# Patient Record
Sex: Male | Born: 1955 | Race: Black or African American | Hispanic: No | Smoking: Never smoker
Health system: Southern US, Community
[De-identification: ages and names within clinical notes are randomized; demographics above are authoritative.]

## PROBLEM LIST (undated history)

## (undated) DIAGNOSIS — G629 Polyneuropathy, unspecified: Secondary | ICD-10-CM

## (undated) DIAGNOSIS — I10 Essential (primary) hypertension: Secondary | ICD-10-CM

## (undated) DIAGNOSIS — R52 Pain, unspecified: Secondary | ICD-10-CM

## (undated) DIAGNOSIS — E7800 Pure hypercholesterolemia, unspecified: Secondary | ICD-10-CM

## (undated) DIAGNOSIS — H9190 Unspecified hearing loss, unspecified ear: Secondary | ICD-10-CM

## (undated) DIAGNOSIS — R202 Paresthesia of skin: Secondary | ICD-10-CM

## (undated) HISTORY — PX: BACK SURGERY: SHX140

## (undated) HISTORY — DX: Pain, unspecified: R52

## (undated) HISTORY — DX: Polyneuropathy, unspecified: G62.9

## (undated) HISTORY — DX: Pure hypercholesterolemia, unspecified: E78.00

## (undated) HISTORY — DX: Paresthesia of skin: R20.2

## (undated) HISTORY — DX: Unspecified hearing loss, unspecified ear: H91.90

## (undated) HISTORY — DX: Essential (primary) hypertension: I10

---

## 1992-05-24 HISTORY — PX: VASECTOMY: SHX75

## 2007-07-25 HISTORY — PX: COLONOSCOPY, DIAGNOSTIC (SCREENING): SHX174

## 2015-02-09 HISTORY — PX: BUNIONECTOMY: SHX129

## 2018-05-31 ENCOUNTER — Encounter (HOSPITAL_COMMUNITY): Payer: Self-pay | Admitting: Emergency Medicine

## 2018-05-31 ENCOUNTER — Emergency Department (HOSPITAL_COMMUNITY): Payer: BLUE CROSS/BLUE SHIELD

## 2018-05-31 ENCOUNTER — Emergency Department (HOSPITAL_COMMUNITY)
Admission: EM | Admit: 2018-05-31 | Discharge: 2018-05-31 | Disposition: A | Discharge status: Home / Self Care | Payer: BLUE CROSS/BLUE SHIELD | Attending: Emergency Medicine | Admitting: Emergency Medicine

## 2018-05-31 ENCOUNTER — Other Ambulatory Visit: Payer: Self-pay

## 2018-05-31 DIAGNOSIS — Y92410 Unspecified street and highway as the place of occurrence of the external cause: Secondary | ICD-10-CM | POA: Insufficient documentation

## 2018-05-31 DIAGNOSIS — Z7901 Long term (current) use of anticoagulants: Secondary | ICD-10-CM | POA: Diagnosis not present

## 2018-05-31 DIAGNOSIS — Y999 Unspecified external cause status: Secondary | ICD-10-CM | POA: Diagnosis not present

## 2018-05-31 DIAGNOSIS — M62838 Other muscle spasm: Secondary | ICD-10-CM | POA: Insufficient documentation

## 2018-05-31 DIAGNOSIS — Y9389 Activity, other specified: Secondary | ICD-10-CM | POA: Insufficient documentation

## 2018-05-31 DIAGNOSIS — R51 Headache: Secondary | ICD-10-CM | POA: Insufficient documentation

## 2018-05-31 DIAGNOSIS — Z79899 Other long term (current) drug therapy: Secondary | ICD-10-CM | POA: Diagnosis not present

## 2018-05-31 DIAGNOSIS — R519 Headache, unspecified: Secondary | ICD-10-CM

## 2018-05-31 MED ORDER — ACETAMINOPHEN 500 MG PO TABS
1000.0000 mg | ORAL_TABLET | Freq: Once | ORAL | Status: AC
  Administered 2018-05-31: 1000 mg via ORAL
  Filled 2018-05-31: qty 2

## 2018-05-31 MED ORDER — DIAZEPAM 5 MG PO TABS
5.0000 mg | ORAL_TABLET | Freq: Once | ORAL | Status: AC
Start: 2018-05-31 — End: 2018-05-31
  Administered 2018-05-31: 5 mg via ORAL
  Filled 2018-05-31: qty 1

## 2018-05-31 MED ORDER — SODIUM CHLORIDE 0.9 % IV BOLUS
1000.0000 mL | Freq: Once | INTRAVENOUS | Status: AC
  Administered 2018-05-31: 1000 mL via INTRAVENOUS

## 2018-05-31 MED ORDER — METHOCARBAMOL 500 MG PO TABS: 500.0000 mg | ORAL_TABLET | Freq: Three times a day (TID) | ORAL | 0 refills | Status: AC | PRN
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
## 2018-05-31 NOTE — Discharge Instructions (Addendum)
Please take Tylenol (acetaminophen) to relieve your pain.  You may take tylenol, up to 1,000 mg (two extra strength pills).  Do not take more than 3,000 mg tylenol in a 24 hour period.  Please check all medication labels as many medications such as pain and cold medications may contain tylenol. Please do not drink alcohol while taking this medication.   The best way to get rid of muscle pain is by taking NSAIDS, using heat, massage therapy, and gentle stretching/range of motion exercises.   Today you received medications that may make you sleepy or impair your ability to make decisions.  For the next 24 hours please do not drive, operate heavy machinery, care for a small child with out another adult present, or perform any activities that may cause harm to you or someone else if you were to fall asleep or be impaired.   You are being prescribed a medication which may make you sleepy. Please follow up of listed precautions for at least 24 hours after taking one dose.

## 2018-05-31 NOTE — ED Notes (Signed)
Pt discharge with no signs symptoms of distress. Pt ambulatory and verbalizes understanding of dc instructions

## 2018-05-31 NOTE — ED Triage Notes (Signed)
Pt is on xarelto for DVT.
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
## 2018-06-01 ENCOUNTER — Encounter (HOSPITAL_COMMUNITY): Payer: Self-pay | Admitting: Emergency Medicine

## 2018-06-01 ENCOUNTER — Emergency Department (HOSPITAL_COMMUNITY)
Admission: EM | Admit: 2018-06-01 | Discharge: 2018-06-02 | Disposition: A | Discharge status: Home / Self Care | Payer: BLUE CROSS/BLUE SHIELD | Attending: Emergency Medicine | Admitting: Emergency Medicine

## 2018-06-01 DIAGNOSIS — Z79899 Other long term (current) drug therapy: Secondary | ICD-10-CM | POA: Diagnosis not present

## 2018-06-01 DIAGNOSIS — R51 Headache: Secondary | ICD-10-CM | POA: Diagnosis not present

## 2018-06-01 DIAGNOSIS — F0781 Postconcussional syndrome: Secondary | ICD-10-CM | POA: Insufficient documentation

## 2018-06-01 MED ORDER — ONDANSETRON 4 MG PO TBDP
4.0000 mg | ORAL_TABLET | Freq: Once | ORAL | Status: AC
  Administered 2018-06-01: 4 mg via ORAL
  Filled 2018-06-01: qty 1
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
## 2018-06-02 ENCOUNTER — Emergency Department (HOSPITAL_COMMUNITY): Payer: BLUE CROSS/BLUE SHIELD

## 2018-06-02 LAB — CBC WITH DIFFERENTIAL/PLATELET
Abs Immature Granulocytes: 0 10*3/uL (ref 0.0–0.1)
Basophils Absolute: 0 10*3/uL (ref 0.0–0.1)
Basophils Relative: 0 %
EOS ABS: 0 10*3/uL (ref 0.0–0.7)
EOS PCT: 0 %
HEMATOCRIT: 51.6 % (ref 39.0–52.0)
HEMOGLOBIN: 17.4 g/dL — AB (ref 13.0–17.0)
Immature Granulocytes: 0 %
LYMPHS ABS: 1.2 10*3/uL (ref 0.7–4.0)
Lymphocytes Relative: 24 %
MCH: 29.9 pg (ref 26.0–34.0)
MCHC: 33.7 g/dL (ref 30.0–36.0)
MCV: 88.7 fL (ref 78.0–100.0)
MONO ABS: 0.6 10*3/uL (ref 0.1–1.0)
Monocytes Relative: 10 %
Neutro Abs: 3.5 10*3/uL (ref 1.7–7.7)
Neutrophils Relative %: 66 %
Platelets: 146 10*3/uL — ABNORMAL LOW (ref 150–400)
RBC: 5.82 MIL/uL — AB (ref 4.22–5.81)
RDW: 12.3 % (ref 11.5–15.5)
WBC: 5.3 10*3/uL (ref 4.0–10.5)

## 2018-06-02 LAB — PROTIME-INR
INR: 1.13
Prothrombin Time: 14.4 seconds (ref 11.4–15.2)

## 2018-06-02 LAB — BASIC METABOLIC PANEL
Anion gap: 9 (ref 5–15)
BUN: 12 mg/dL (ref 6–20)
CHLORIDE: 106 mmol/L (ref 101–111)
CO2: 24 mmol/L (ref 22–32)
CREATININE: 1.25 mg/dL — AB (ref 0.61–1.24)
Calcium: 9.6 mg/dL (ref 8.9–10.3)
GFR calc Af Amer: >60 mL/min (ref >60)
GLUCOSE: 105 mg/dL — AB (ref 65–99)
Potassium: 4 mmol/L (ref 3.5–5.1)
SODIUM: 139 mmol/L (ref 135–145)

## 2018-06-02 MED ORDER — METOCLOPRAMIDE HCL 5 MG/ML IJ SOLN
10.0000 mg | Freq: Once | INTRAMUSCULAR | Status: AC
  Administered 2018-06-02: 10 mg via INTRAVENOUS
  Filled 2018-06-02: qty 2

## 2018-06-02 MED ORDER — KETOROLAC TROMETHAMINE 30 MG/ML IJ SOLN
15.0000 mg | Freq: Once | INTRAMUSCULAR | Status: AC
  Administered 2018-06-02: 15 mg via INTRAVENOUS
  Filled 2018-06-02: qty 1

## 2018-06-02 MED ORDER — SODIUM CHLORIDE 0.9 % IV BOLUS
1000.0000 mL | Freq: Once | INTRAVENOUS | Status: AC
  Administered 2018-06-02: 1000 mL via INTRAVENOUS

## 2018-06-02 MED ORDER — DIPHENHYDRAMINE HCL 50 MG/ML IJ SOLN
25.0000 mg | Freq: Once | INTRAMUSCULAR | Status: AC
  Administered 2018-06-02: 25 mg via INTRAVENOUS
  Filled 2018-06-02: qty 1

## 2018-06-02 NOTE — ED Notes (Signed)
Pt given a ginger ale for PO challenge  
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
## 2018-06-02 NOTE — ED Notes (Signed)
Called Patient to reassess vitals x3 and had no response. 

## 2018-08-12 ENCOUNTER — Encounter: Payer: Self-pay | Admitting: Internal Medicine

## 2018-08-12 ENCOUNTER — Ambulatory Visit: Payer: No Typology Code available for payment source | Admitting: Internal Medicine

## 2018-08-12 DIAGNOSIS — F524 Premature ejaculation: Secondary | ICD-10-CM

## 2018-08-12 DIAGNOSIS — I444 Left anterior fascicular block: Secondary | ICD-10-CM

## 2018-08-12 DIAGNOSIS — Z9889 Other specified postprocedural states: Secondary | ICD-10-CM

## 2018-08-12 DIAGNOSIS — I10 Essential (primary) hypertension: Secondary | ICD-10-CM

## 2018-08-12 DIAGNOSIS — Z0000 Encounter for general adult medical examination without abnormal findings: Secondary | ICD-10-CM

## 2018-08-12 DIAGNOSIS — A6001 Herpesviral infection of penis: Secondary | ICD-10-CM

## 2018-08-12 DIAGNOSIS — N200 Calculus of kidney: Secondary | ICD-10-CM

## 2018-08-12 DIAGNOSIS — E782 Mixed hyperlipidemia: Secondary | ICD-10-CM

## 2018-08-12 DIAGNOSIS — Z9852 Vasectomy status: Secondary | ICD-10-CM

## 2018-08-12 DIAGNOSIS — I82411 Acute embolism and thrombosis of right femoral vein: Secondary | ICD-10-CM

## 2018-08-12 DIAGNOSIS — K635 Polyp of colon: Secondary | ICD-10-CM

## 2018-08-12 HISTORY — DX: Calculus of kidney: N20.0

## 2018-08-12 HISTORY — DX: Left anterior fascicular block: I44.4

## 2018-08-12 HISTORY — DX: Herpesviral infection of penis: A60.01

## 2018-08-12 HISTORY — DX: Other specified postprocedural states: Z98.890

## 2018-08-12 HISTORY — DX: Vasectomy status: Z98.52

## 2018-08-12 HISTORY — DX: Premature ejaculation: F52.4

## 2018-08-12 HISTORY — DX: Mixed hyperlipidemia: E78.2

## 2018-08-12 HISTORY — DX: Polyp of colon: K63.5

## 2018-08-12 HISTORY — DX: Acute embolism and thrombosis of right femoral vein: I82.411

## 2018-08-12 MED ORDER — VALSARTAN-HYDROCHLOROTHIAZIDE 320-12.5 MG PO TABS
1.00 | ORAL_TABLET | Freq: Every day | ORAL | 1 refills | Status: DC
Start: 2018-08-12 — End: 2019-02-23

## 2018-08-12 MED ORDER — VALACYCLOVIR HCL 1 G PO TABS
1000.00 mg | ORAL_TABLET | Freq: Every day | ORAL | 3 refills | Status: DC
Start: 2018-08-12 — End: 2019-02-13

## 2018-08-12 MED ORDER — RIVAROXABAN 20 MG PO TABS
20.00 mg | ORAL_TABLET | Freq: Every day | ORAL | 1 refills | Status: DC
Start: 2018-08-12 — End: 2019-02-23

## 2018-08-12 NOTE — Progress Notes (Signed)
Date Time: 08/12/2018 10:06 AM  Patient Name: [name], [name]   DOB: [birth year]    Subjective:   [name] is a 62 y.o. male who presents for the very first time to meet his new primary care physician  He moved to this area recently as he has secured a new job  He takes care of himself very well and he keeps good notes about his medical history  Currently he has no breathing troubles chest pains bowel or urinary problems  Some of his medications he has not been taken and some he ran out off    Past Medical History:     Past Medical History:   Diagnosis Date   . 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 H/O lumbar discectomy 08/12/2018    2011    . H/O: vasectomy 08/12/2018   . 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   . Hyperplastic colonic polyp 08/12/2018    2017 return in 5 years   . Hypertension    . 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   . Mixed hyperlipidemia 08/12/2018   . Nephrolithiasis 08/12/2018    x2 last 2018    . Premature ejaculation 08/12/2018       Past Surgical History:     Past Surgical History:   Procedure Laterality Date   . BUNIONECTOMY  02/09/2015   . COLONOSCOPY  07/2007   . VASECTOMY  05/1992       Family History:   No family history on file.    Social History:     Social History     Social History   . Marital status: Divorced     Spouse name: N/A   . Number of children: N/A   . Years of education: N/A     Social History Main Topics   . Smoking status: Never Smoker   . Smokeless tobacco: Never Used   . Alcohol use Yes   . Drug use: No   . Sexual activity: Not Asked     Other Topics Concern   . None     Social History Narrative   . None       Allergies:   No Known Allergies    Medications:     Current Outpatient Prescriptions   Medication Sig Dispense Refill   . Multiple Vitamins-Minerals (MULTIVITAMIN WITH MINERALS) tablet Take 1 tablet by mouth daily     . rivaroxaban (XARELTO) 20 MG Tab Take 1 tablet (20 mg total) by mouth daily  with dinner 90 tablet 1   . sertraline (ZOLOFT) 50 MG tablet Take 50 mg by mouth daily     . simvastatin (ZOCOR) 40 MG tablet Take 40 mg by mouth nightly     . valacyclovir (VALTREX) 1000 MG tablet Take 1 tablet (1,000 mg total) by mouth daily 5 tablet 3   . vitamin D (CHOLECALCIFEROL) 1000 units tablet Take 5,000 Units by mouth daily     . valsartan-hydroCHLOROthiazide (DIOVAN HCT) 320-12.5 MG per tablet Take 1 tablet by mouth daily 90 tablet 1     No current facility-administered medications for this visit.        Review of Systems:   A comprehensive review of systems was:   General ROS:   Respiratory ROS: no cough, shortness of breath, or wheezing  Cardiovascular ROS: no chest pain or dyspnea on exertion  Gastrointestinal ROS: no abdominal pain, change in bowel habits, or black or  bloody stools  Genito-Urinary ROS: no dysuria, trouble voiding, or hematuria  Musculoskeletal ROS: negative for - joint pain or joint swelling  Neurological ROS: no TIA or stroke symptoms  Dermatologic ROS: negative for rash    Physical Exam:     Vitals:    08/12/18 0913   BP: (!) 167/96   Pulse: (!) 55   SpO2: 98%     BP Readings from Last 3 Encounters:   08/12/18 (!) 167/96     Wt Readings from Last 3 Encounters:   08/12/18 83 kg (183 lb)   Body mass index is 26.26 kg/m.    General appearance - alert, well appearing, and in no distress  Chest - clear to auscultation, no wheezes, rales or rhonchi, symmetric air entry  Heart - normal rate, regular rhythm, normal S1, S2, no murmurs, rubs, clicks or gallops  Abdomen - soft, nontender, nondistended, no masses or organomegaly  Neurological - alert, oriented, normal speech, no focal findings or movement disorder noted  Extremities - peripheral pulses normal, no pedal edema, no clubbing or cyanosis    Labs:   No results found for this or any previous visit (from the past 2016 hour(s)).    Assessment and Plan:     Patient Active Problem List   Diagnosis   . H/O: vasectomy   . Hyperplastic  colonic polyp   . 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 Hypertension   . H/O lumbar discectomy   . Nephrolithiasis   . Mixed hyperlipidemia   . Premature ejaculation   . Herpes simplex infection of penis   . LAFB (left anterior fascicular block)       No orders of the defined types were placed in this encounter.      1. 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 Hyperlipidemia but he is not taking his medications I told him to take the medications and come back in 3 months for a recheck on his labs    3.  Recurrent genital herpes about once a year and he gets Valtrex for 5 days new prescription was given    4.  Hypertension not well-controlled increase his Diovan to 320/12.5 and recheck at home and bring readings next visit    5.  History of premature ejaculation and he uses Zoloft for that    6.  History of colonic polyps last colonoscopy was in 2017 and he will follow-up in 5 years from that time    7.  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  History of kidney stones twice that he can remember drinking plenty of fluids  RTC in 3 months      This note was generated by the Epic EMR system/Speech recognition and may contain inherent errors or omissions not intended by the user. Grammatical errors, random word insertions, deletions and pronoun errors are occasional consequences of this technology due to software limitations.   Not all errors are caught or corrected. If there are questions or concerns about the content of this note or information contained within the body of this dictation they should be addressed directly with the author for clarification.      Signed by: Nyra Jabs

## 2018-11-07 ENCOUNTER — Ambulatory Visit: Payer: No Typology Code available for payment source | Admitting: Internal Medicine

## 2018-11-07 ENCOUNTER — Encounter: Payer: Self-pay | Admitting: Internal Medicine

## 2018-11-07 VITALS — BP 136/84 | HR 59 | Ht 70.0 in | Wt 194.0 lb

## 2018-11-07 DIAGNOSIS — Z9852 Vasectomy status: Secondary | ICD-10-CM

## 2018-11-07 DIAGNOSIS — Z9889 Other specified postprocedural states: Secondary | ICD-10-CM

## 2018-11-07 DIAGNOSIS — I10 Essential (primary) hypertension: Secondary | ICD-10-CM

## 2018-11-07 DIAGNOSIS — I82411 Acute embolism and thrombosis of right femoral vein: Secondary | ICD-10-CM

## 2018-11-07 DIAGNOSIS — F524 Premature ejaculation: Secondary | ICD-10-CM

## 2018-11-07 DIAGNOSIS — N200 Calculus of kidney: Secondary | ICD-10-CM

## 2018-11-07 DIAGNOSIS — I444 Left anterior fascicular block: Secondary | ICD-10-CM

## 2018-11-07 DIAGNOSIS — E782 Mixed hyperlipidemia: Secondary | ICD-10-CM

## 2018-11-07 DIAGNOSIS — K635 Polyp of colon: Secondary | ICD-10-CM

## 2018-11-07 DIAGNOSIS — A6001 Herpesviral infection of penis: Secondary | ICD-10-CM

## 2018-11-07 MED ORDER — SILDENAFIL CITRATE 100 MG PO TABS
100.0000 mg | ORAL_TABLET | ORAL | 1 refills | Status: DC | PRN
Start: 2018-11-07 — End: 2019-01-01
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
## 2018-11-08 LAB — BASIC METABOLIC PANEL
BUN / Creatinine Ratio: 11 (ref 10–24)
BUN: 16 mg/dL (ref 8–27)
CO2: 27 mmol/L (ref 20–29)
Calcium: 10 mg/dL (ref 8.6–10.2)
Chloride: 98 mmol/L (ref 96–106)
Creatinine: 1.4 mg/dL — ABNORMAL HIGH (ref 0.76–1.27)
EGFR: 54 mL/min/{1.73_m2} — ABNORMAL LOW (ref >59)
EGFR: 63 mL/min/{1.73_m2} (ref >59)
Glucose: 75 mg/dL (ref 65–99)
Potassium: 4.2 mmol/L (ref 3.5–5.2)
Sodium: 140 mmol/L (ref 134–144)

## 2018-11-08 LAB — CBC AND DIFFERENTIAL
Baso(Absolute): 0 10*3/uL (ref 0.0–0.2)
Basos: 0 %
Eos: 1 %
Eosinophils Absolute: 0 10*3/uL (ref 0.0–0.4)
Hematocrit: 53.3 % — ABNORMAL HIGH (ref 37.5–51.0)
Hemoglobin: 18.1 g/dL — ABNORMAL HIGH (ref 13.0–17.7)
Immature Granulocytes Absolute: 0 10*3/uL (ref 0.0–0.1)
Immature Granulocytes: 0 %
Lymphocytes Absolute: 1.5 10*3/uL (ref 0.7–3.1)
Lymphocytes: 52 %
MCH: 30.6 pg (ref 26.6–33.0)
MCHC: 34 g/dL (ref 31.5–35.7)
MCV: 90 fL (ref 79–97)
Monocytes Absolute: 0.4 10*3/uL (ref 0.1–0.9)
Monocytes: 12 %
Neutrophils Absolute: 1.1 10*3/uL — ABNORMAL LOW (ref 1.4–7.0)
Neutrophils: 35 %
Platelets: 135 10*3/uL — ABNORMAL LOW (ref 150–450)
RBC: 5.91 x10E6/uL — ABNORMAL HIGH (ref 4.14–5.80)
RDW: 12.9 % (ref 12.3–15.4)
WBC: 3 10*3/uL — ABNORMAL LOW (ref 3.4–10.8)

## 2018-11-08 LAB — IRON DEFICIENCY PROFILE
Ferritin: 51 ng/mL (ref 30–400)
Iron Saturation: 26 % (ref 15–55)
Iron: 90 ug/dL (ref 38–169)
TIBC: 342 ug/dL (ref 250–450)
UIBC: 252 ug/dL (ref 111–343)

## 2018-11-08 LAB — HEPATIC FUNCTION PANEL
ALT: 29 IU/L (ref 0–44)
AST (SGOT): 26 IU/L (ref 0–40)
Albumin: 4.8 g/dL (ref 3.6–4.8)
Alkaline Phosphatase: 93 IU/L (ref 39–117)
Bilirubin Direct: 0.15 mg/dL (ref 0.00–0.40)
Bilirubin, Total: 0.6 mg/dL (ref 0.0–1.2)
Protein, Total: 7.4 g/dL (ref 6.0–8.5)

## 2018-11-08 LAB — HEMOGLOBIN A1C: Hemoglobin A1C: 5.2 % (ref 4.8–5.6)

## 2018-11-08 LAB — LIPID PANEL
Cholesterol / HDL Ratio: 2.9 ratio (ref 0.0–5.0)
Cholesterol: 255 mg/dL — ABNORMAL HIGH (ref 100–199)
HDL: 88 mg/dL (ref >39)
LDL Calculated: 153 mg/dL — ABNORMAL HIGH (ref 0–99)
Triglycerides: 68 mg/dL (ref 0–149)
VLDL Calculated: 14 mg/dL (ref 5–40)

## 2018-11-08 LAB — VITAMIN B12: Vitamin B-12: 861 pg/mL (ref 232–1245)

## 2018-11-08 LAB — TSH: TSH: 1.2 u[IU]/mL (ref 0.450–4.500)

## 2018-12-31 ENCOUNTER — Other Ambulatory Visit: Payer: Self-pay | Admitting: Internal Medicine

## 2019-01-01 ENCOUNTER — Other Ambulatory Visit: Payer: Self-pay | Admitting: Internal Medicine

## 2019-01-01 ENCOUNTER — Encounter: Payer: Self-pay | Admitting: Internal Medicine

## 2019-01-01 MED ORDER — TADALAFIL 10 MG PO TABS
10.0000 mg | ORAL_TABLET | Freq: Every day | ORAL | 3 refills | Status: DC | PRN
Start: 2019-01-01 — End: 2019-04-23

## 2019-02-13 ENCOUNTER — Encounter: Payer: Self-pay | Admitting: Internal Medicine

## 2019-02-13 ENCOUNTER — Ambulatory Visit: Payer: BC Managed Care – PPO | Admitting: Internal Medicine

## 2019-02-13 VITALS — BP 145/95 | HR 45 | Ht 70.0 in | Wt 203.0 lb

## 2019-02-13 DIAGNOSIS — K635 Polyp of colon: Secondary | ICD-10-CM

## 2019-02-13 DIAGNOSIS — E782 Mixed hyperlipidemia: Secondary | ICD-10-CM

## 2019-02-13 DIAGNOSIS — I82411 Acute embolism and thrombosis of right femoral vein: Secondary | ICD-10-CM

## 2019-02-13 DIAGNOSIS — I10 Essential (primary) hypertension: Secondary | ICD-10-CM

## 2019-02-13 MED ORDER — VALACYCLOVIR HCL 1 G PO TABS
1000.0000 mg | ORAL_TABLET | Freq: Two times a day (BID) | ORAL | 3 refills | Status: DC
Start: 2019-02-13 — End: 2019-08-28

## 2019-02-13 MED ORDER — ROSUVASTATIN CALCIUM 40 MG PO TABS
40.0000 mg | ORAL_TABLET | Freq: Every day | ORAL | 0 refills | Status: DC
Start: 2019-02-13 — End: 2019-08-28

## 2019-02-13 NOTE — Progress Notes (Signed)
Date Time: 02/13/2019 10:05 AM  Patient Name: [name]   DOB: 1956/03/22    Subjective:   [name] is a 63 y.o. 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    x2 last 2018    H/O lumbar discectomy 08/12/2018    2011     H/O: vasectomy 08/12/2018    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    Hyperplastic colonic polyp 08/12/2018    2017 return in 5 years    Hypertension     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    Mixed hyperlipidemia 08/12/2018    Nephrolithiasis 08/12/2018    x2 last 2018     Premature ejaculation 08/12/2018       Past Surgical History:     Past Surgical History:   Procedure Laterality Date    BUNIONECTOMY  02/09/2015    COLONOSCOPY  07/2007    VASECTOMY  05/1992       Family History:   History reviewed. No pertinent family history.    Social History:     Social History     Socioeconomic History    Marital status: Divorced     Spouse name: None    Number of children: None    Years of education: None    Highest education level: None   Occupational History    None   Social Engineer, site strain: None    Food insecurity:     Worry: None     Inability: None    Transportation needs:     Medical: None     Non-medical: None   Tobacco Use    Smoking status: Never Smoker    Smokeless tobacco: Never Used   Substance and Sexual Activity    Alcohol use: Yes    Drug use: No    Sexual activity: None   Lifestyle    Physical activity:     Days per week: None     Minutes per session: None    Stress: None   Relationships    Social connections:      Talks on phone: None     Gets together: None     Attends religious service: None     Active member of club or organization: None     Attends meetings of clubs or organizations: None     Relationship status: None    Intimate partner violence:     Fear of current or ex partner: None     Emotionally abused: None     Physically abused: None     Forced sexual activity: None   Other Topics Concern    None   Social History Narrative    None       Allergies:   No Known Allergies    Medications:     Current Outpatient Medications   Medication Sig Dispense Refill    Multiple Vitamins-Minerals (MULTIVITAMIN WITH MINERALS) tablet Take 1 tablet by mouth daily  rivaroxaban (XARELTO) 20 MG Tab Take 1 tablet (20 mg total) by mouth daily with dinner 90 tablet 1    rosuvastatin (CRESTOR) 40 MG tablet Take 1 tablet (40 mg total) by mouth daily 90 tablet 0    sertraline (ZOLOFT) 50 MG tablet Take 50 mg by mouth daily      tadalafil (CIALIS) 10 MG tablet Take 1 tablet (10 mg total) by mouth daily as needed for Erectile Dysfunction 10 tablet 3    valacyclovir (VALTREX) 1000 MG tablet Take 1 tablet (1,000 mg total) by mouth 2 (two) times daily 20 tablet 3    valsartan-hydroCHLOROthiazide (DIOVAN HCT) 320-12.5 MG per tablet Take 1 tablet by mouth daily 90 tablet 1    vitamin D (CHOLECALCIFEROL) 1000 units tablet Take 5,000 Units by mouth daily       No current facility-administered medications for this visit.        Review of Systems:   A comprehensive review of systems was:   General ROS:   Respiratory ROS: no cough, shortness of breath, or wheezing  Cardiovascular ROS: no chest pain or dyspnea on exertion  Gastrointestinal ROS: no abdominal pain, change in bowel habits, or black or bloody stools  Genito-Urinary ROS: no dysuria, trouble voiding, or hematuria. Recurrent herpes simplex attacks  Musculoskeletal ROS: neck pain with movement to the extremes  Neurological ROS: no TIA or stroke symptoms  Dermatologic ROS: negative  for rash    Physical Exam:     Vitals:    02/13/19 0951   BP: (!) 145/95   Pulse: (!) 45   SpO2: 98%     BP Readings from Last 3 Encounters:   02/13/19 (!) 145/95   11/07/18 136/84   08/12/18 (!) 167/96     Wt Readings from Last 3 Encounters:   02/13/19 92.1 kg (203 lb)   11/07/18 88 kg (194 lb)   08/12/18 83 kg (183 lb)   Body mass index is 29.13 kg/m.    General appearance - alert, well appearing, and in no distress  Chest - clear to auscultation, no wheezes, rales or rhonchi, symmetric air entry  Heart - normal rate, regular rhythm, normal S1, S2, no murmurs, rubs, clicks or gallops  Abdomen - soft, nontender, nondistended, no masses or organomegaly  Neurological - alert, oriented, normal speech, no focal findings or movement disorder noted  Extremities - peripheral pulses normal, no pedal edema, no clubbing or cyanosis      Labs:   No results found for this or any previous visit (from the past 2016 hour(s)).    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      1. 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 Hyperlipidemia but he is not taking his medications as he should be therefore switch Zocor to Crestor 40 and recheck in 3 months    3.  Recurrent genital herpes about once a year and  he gets Valtrex for 5 days new prescription was given    4.  Hypertension not well-controlled because of poor compliance I admonished his behavior    5.  History of premature ejaculation and he uses Zoloft for that, And Viagra for erectile dysfunction    6.  History of colonic polyps  last colonoscopy was in 2017 and he will follow-up in 5 years from that time    7.  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  History of kidney stones twice that he can remember drinking plenty of fluids    9. Erectile dysfunction and he is on Cialis 10 mg daily  RTC in 3 months      This note was generated by the Epic EMR system/Speech recognition and may contain inherent errors or omissions not intended by the user. Grammatical errors, random word insertions, deletions and pronoun errors are occasional consequences of this technology due to software limitations.   Not all errors are caught or corrected. If there are questions or concerns about the content of this note or information contained within the body of this dictation they should be addressed directly with the author for clarification.      Signed by: Nyra Jabs

## 2019-02-20 ENCOUNTER — Other Ambulatory Visit: Payer: Self-pay | Admitting: Internal Medicine
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
## 2019-03-20 ENCOUNTER — Other Ambulatory Visit: Payer: Self-pay | Admitting: Internal Medicine

## 2019-03-20 MED ORDER — RIVAROXABAN 20 MG PO TABS
20.0000 mg | ORAL_TABLET | Freq: Every day | ORAL | 1 refills | Status: DC
Start: 2019-03-20 — End: 2019-08-28

## 2019-03-20 MED ORDER — VALSARTAN-HYDROCHLOROTHIAZIDE 320-12.5 MG PO TABS
1.0000 | ORAL_TABLET | Freq: Every day | ORAL | 1 refills | Status: DC
Start: 2019-03-20 — End: 2019-08-28

## 2019-04-23 ENCOUNTER — Other Ambulatory Visit: Payer: Self-pay | Admitting: Internal Medicine

## 2019-04-23 MED ORDER — TADALAFIL 10 MG PO TABS
10.0000 mg | ORAL_TABLET | Freq: Every day | ORAL | 3 refills | Status: DC | PRN
Start: 2019-04-23 — End: 2019-08-28

## 2019-05-13 ENCOUNTER — Ambulatory Visit: Payer: No Typology Code available for payment source | Admitting: Internal Medicine

## 2019-05-14 ENCOUNTER — Ambulatory Visit: Payer: No Typology Code available for payment source | Admitting: Internal Medicine

## 2019-05-22 ENCOUNTER — Ambulatory Visit: Payer: No Typology Code available for payment source | Admitting: Internal Medicine

## 2019-08-28 ENCOUNTER — Encounter: Payer: Self-pay | Admitting: Internal Medicine

## 2019-08-28 ENCOUNTER — Ambulatory Visit: Payer: BC Managed Care – PPO | Admitting: Internal Medicine

## 2019-08-28 VITALS — BP 142/89 | HR 66 | Wt 204.0 lb

## 2019-08-28 DIAGNOSIS — U071 COVID-19: Secondary | ICD-10-CM

## 2019-08-28 DIAGNOSIS — D126 Benign neoplasm of colon, unspecified: Secondary | ICD-10-CM

## 2019-08-28 DIAGNOSIS — I10 Essential (primary) hypertension: Secondary | ICD-10-CM

## 2019-08-28 DIAGNOSIS — R0989 Other specified symptoms and signs involving the circulatory and respiratory systems: Secondary | ICD-10-CM

## 2019-08-28 DIAGNOSIS — R6889 Other general symptoms and signs: Secondary | ICD-10-CM

## 2019-08-28 HISTORY — DX: Other specified symptoms and signs involving the circulatory and respiratory systems: R09.89

## 2019-08-28 HISTORY — DX: COVID-19: U07.1

## 2019-08-28 HISTORY — DX: Benign neoplasm of colon, unspecified: D12.6

## 2019-08-28 MED ORDER — VALSARTAN-HYDROCHLOROTHIAZIDE 320-12.5 MG PO TABS
1.0000 | ORAL_TABLET | Freq: Every day | ORAL | 1 refills | Status: DC
Start: 2019-08-28 — End: 2019-09-29

## 2019-08-28 MED ORDER — SERTRALINE HCL 50 MG PO TABS
50.0000 mg | ORAL_TABLET | Freq: Every day | ORAL | 1 refills | Status: DC
Start: 2019-08-28 — End: 2020-09-09

## 2019-08-28 MED ORDER — ROSUVASTATIN CALCIUM 40 MG PO TABS
40.0000 mg | ORAL_TABLET | Freq: Every day | ORAL | 0 refills | Status: DC
Start: 2019-08-28 — End: 2019-11-23

## 2019-08-28 MED ORDER — VALACYCLOVIR HCL 1 G PO TABS
1000.0000 mg | ORAL_TABLET | Freq: Two times a day (BID) | ORAL | 3 refills | Status: DC
Start: 2019-08-28 — End: 2021-06-02

## 2019-08-28 MED ORDER — RIVAROXABAN 20 MG PO TABS
20.0000 mg | ORAL_TABLET | Freq: Every day | ORAL | 1 refills | Status: DC
Start: 2019-08-28 — End: 2020-09-09

## 2019-08-28 MED ORDER — TADALAFIL 20 MG PO TABS
10.0000 mg | ORAL_TABLET | Freq: Every day | ORAL | 3 refills | Status: DC | PRN
Start: 2019-08-28 — End: 2020-02-03

## 2019-08-28 NOTE — Progress Notes (Signed)
Date Time: 08/28/2019 1:03 PM  Patient Name: [name], [name]   DOB: 11-09-1956    Subjective:   [name] is a 63 y.o. 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    x2 last 2018    H/O lumbar discectomy 08/12/2018    2011     H/O: vasectomy 08/12/2018    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    Hyperplastic colonic polyp 08/12/2018    2017 return in 5 years    Hypertension     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    Mixed hyperlipidemia 08/12/2018    Nephrolithiasis 08/12/2018    x2 last 2018     Premature ejaculation 08/12/2018    Throat fullness 08/28/2019       Past Surgical History:     Past Surgical History:   Procedure Laterality Date    BUNIONECTOMY  02/09/2015    COLONOSCOPY  07/2007    VASECTOMY  05/1992       Family History:   History reviewed. No pertinent family history.    Social History:     Social History     Socioeconomic History    Marital status: Divorced     Spouse name: None    Number of children: None    Years of education: None    Highest education level: None   Occupational History    None   Social Engineer, site strain: None    Food insecurity     Worry: None     Inability: None    Transportation needs     Medical: None     Non-medical: None   Tobacco Use    Smoking status: Never Smoker    Smokeless tobacco: Never Used   Substance and Sexual  Activity    Alcohol use: Yes    Drug use: No    Sexual activity: None   Lifestyle    Physical activity     Days per week: None     Minutes per session: None    Stress: None   Relationships    Social connections     Talks on phone: None     Gets together: None     Attends religious service: None     Active member of club or organization: None     Attends meetings of clubs or organizations: None     Relationship status: None    Intimate partner violence     Fear of current or ex partner: None     Emotionally abused: None     Physically abused: None     Forced sexual activity: None   Other Topics Concern    None   Social History Narrative    None  Allergies:   No Known Allergies    Medications:     Current Outpatient Medications   Medication Sig Dispense Refill    Multiple Vitamins-Minerals (MULTIVITAMIN WITH MINERALS) tablet Take 1 tablet by mouth daily      rivaroxaban (Xarelto) 20 MG Tab Take 1 tablet (20 mg total) by mouth daily with dinner 90 tablet 1    rosuvastatin (Crestor) 40 MG tablet Take 1 tablet (40 mg total) by mouth daily 90 tablet 0    sertraline (ZOLOFT) 50 MG tablet Take 1 tablet (50 mg total) by mouth daily 90 tablet 1    tadalafil (CIALIS) 20 MG tablet Take 0.5 tablets (10 mg total) by mouth daily as needed for Erectile Dysfunction 10 tablet 3    valacyclovir (VALTREX) 1000 MG tablet Take 1 tablet (1,000 mg total) by mouth 2 (two) times daily 20 tablet 3    valsartan-hydroCHLOROthiazide (DIOVAN-HCT) 320-12.5 MG per tablet Take 1 tablet by mouth daily 90 tablet 1    vitamin D (CHOLECALCIFEROL) 1000 units tablet Take 5,000 Units by mouth daily       No current facility-administered medications for this visit.        Review of Systems:   A comprehensive review of systems was:   General ROS: throat fullness  Respiratory ROS: no cough, shortness of breath, or wheezing  Cardiovascular ROS: no chest pain or dyspnea on exertion  Gastrointestinal ROS: no abdominal pain, change in bowel  habits, or black or bloody stools  Genito-Urinary ROS: no dysuria, trouble voiding, or hematuria. Recurrent herpes simplex attacks  Musculoskeletal ROS: no joint pains or swelling  Neurological ROS: no TIA or stroke symptoms  Dermatologic ROS: negative for rash    Physical Exam:     Vitals:    08/28/19 1108   BP: 142/89   Pulse: 66   SpO2: 98%     BP Readings from Last 3 Encounters:   08/28/19 142/89   02/13/19 (!) 145/95   11/07/18 136/84     Wt Readings from Last 3 Encounters:   08/28/19 92.5 kg (204 lb)   02/13/19 92.1 kg (203 lb)   11/07/18 88 kg (194 lb)   Body mass index is 29.27 kg/m.    General appearance - alert, well appearing, and in no distress  Chest - clear to auscultation, no wheezes, rales or rhonchi, symmetric air entry  Heart - normal rate, regular rhythm, normal S1, S2, no murmurs, rubs, clicks or gallops  Abdomen - soft, nontender, nondistended, no masses or organomegaly  Neurological - alert, oriented, normal speech, no focal findings or movement disorder noted  Extremities - peripheral pulses normal,  no clubbing or cyanosis   trace edema and vertical dermatitis around the right ankle    Labs:   No results found for this or any previous visit (from the past 2016 hour(s)).    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    Hypertension    H/O lumbar discectomy    Nephrolithiasis    Mixed hyperlipidemia    Premature ejaculation    Herpes simplex infection of penis    LAFB (left anterior fascicular block)    Throat fullness    COVID-19 virus infection    Adenomatous polyp of colon       Orders Placed This Encounter   Procedures  Ambulatory referral to ENT     Referral Priority:   Routine     Referral Type:   Consultation     Referral Reason:   Specialty Services Required     Referred to Provider:   Cathey Endow, MD     Requested Specialty:    Otorhinolaryngology     Number of Visits Requested:   1    Ambulatory referral to Gastroenterology     Referral Priority:   Routine     Referral Type:   Consultation     Referral Reason:   Specialty Services Required     Referred to Provider:   Duwaine Maxin, MD     Requested Specialty:   Gastroenterology     Number of Visits Requested:   1       1. 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 Hyperlipidemia but he is not taking his medications as he should I told him not to waste his time if he is taking it intermittently he either commits to taking it or not he verbalizes understanding    3.  Recurrent genital herpes about once a year and he gets Valtrex for 5 days new prescription was given    4.  Hypertension not well-controlled because of poor compliance I admonished his behavior    5.  History of premature ejaculation and he uses Zoloft for that, And Viagra for erectile dysfunction    6.  History of colonic polyps last colonoscopy was in 2017 and he will follow-up in 5 years from that time    7.  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  History of kidney stones twice that he can remember drinking plenty of fluids    9. Erectile dysfunction and he Wanted an increase in the dose of Cialis therefore we gave him 20 mg    10.  Sensation of fullness in the throat he wanted something done I will refer him to ENT    11.  History of colonic polyps he believes his last colonoscopy was 5 years ago I will refer him to Dr. Andrey Campanile for colonoscopy  RTC in 3 months      This note was generated by the Epic EMR system/Speech recognition and may contain inherent errors or omissions not intended by the user. Grammatical errors, random word insertions, deletions and pronoun errors are occasional consequences of this technology due to software limitations.   Not all errors are caught or corrected. If there are  questions or concerns about the content of this note or information contained within the body of this dictation they should be addressed directly with the author for clarification.      Signed by: Nyra Jabs

## 2019-09-29 ENCOUNTER — Ambulatory Visit: Payer: BC Managed Care – PPO | Admitting: Internal Medicine

## 2019-09-29 ENCOUNTER — Encounter: Payer: Self-pay | Admitting: Internal Medicine

## 2019-09-29 VITALS — BP 145/91 | HR 62 | Ht 70.0 in | Wt 200.0 lb

## 2019-09-29 DIAGNOSIS — R6889 Other general symptoms and signs: Secondary | ICD-10-CM

## 2019-09-29 DIAGNOSIS — I10 Essential (primary) hypertension: Secondary | ICD-10-CM

## 2019-09-29 DIAGNOSIS — E782 Mixed hyperlipidemia: Secondary | ICD-10-CM

## 2019-09-29 DIAGNOSIS — Z01818 Encounter for other preprocedural examination: Secondary | ICD-10-CM

## 2019-09-29 DIAGNOSIS — R0989 Other specified symptoms and signs involving the circulatory and respiratory systems: Secondary | ICD-10-CM

## 2019-09-29 HISTORY — DX: Encounter for other preprocedural examination: Z01.818

## 2019-09-29 MED ORDER — VALSARTAN-HYDROCHLOROTHIAZIDE 320-12.5 MG PO TABS
1.0000 | ORAL_TABLET | Freq: Every day | ORAL | 1 refills | Status: DC
Start: 2019-09-29 — End: 2020-03-04

## 2019-09-29 NOTE — Addendum Note (Signed)
Addended by: Arvilla Meres on: 09/29/2019 02:59 PM     Modules accepted: Orders

## 2019-09-29 NOTE — Progress Notes (Signed)
Preop evaluation      Date Time: 09/29/2019 2:14 PM  Patient Name: [name], [name]   DOB: 1956-08-20    Subjective:   [name] is a 63 y.o. male who presents for a preop evaluation  I had sent him to Dr. Harrold Donath because of a strange sensation in his throat when he swallows.  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    x2 last 2018    H/O lumbar discectomy 08/12/2018    2011     H/O: vasectomy 08/12/2018    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    Hyperplastic colonic polyp 08/12/2018    2017 return in 5 years    Hypertension     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    Mixed hyperlipidemia 08/12/2018    Nephrolithiasis 08/12/2018    x2 last 2018     Premature ejaculation 08/12/2018    Preop examination 09/29/2019    Throat fullness 08/28/2019       Past Surgical History:     Past Surgical History:   Procedure Laterality Date    BUNIONECTOMY  02/09/2015    COLONOSCOPY  07/2007    VASECTOMY  05/1992       Family History:   History reviewed. No pertinent family history.    Social History:     Social History     Socioeconomic History    Marital status: Divorced     Spouse name: None    Number of children: None    Years of education: None    Highest education level: None   Occupational History    None   Social Engineer, site strain: None    Food insecurity     Worry: None     Inability: None    Transportation needs     Medical: None     Non-medical: None   Tobacco Use    Smoking status: Never Smoker    Smokeless tobacco: Never Used   Substance and Sexual Activity    Alcohol  use: Yes    Drug use: No    Sexual activity: None   Lifestyle    Physical activity     Days per week: None     Minutes per session: None    Stress: None   Relationships    Social connections     Talks on phone: None     Gets together: None     Attends religious service: None     Active member of club or organization: None     Attends meetings of clubs or organizations: None     Relationship status: None    Intimate partner violence     Fear of current or ex partner: None     Emotionally abused: None     Physically abused: None     Forced sexual activity: None   Other Topics Concern    None   Social History Narrative    None  Allergies:   No Known Allergies    Medications:     Current Outpatient Medications   Medication Sig Dispense Refill    Multiple Vitamins-Minerals (MULTIVITAMIN WITH MINERALS) tablet Take 1 tablet by mouth daily      rivaroxaban (Xarelto) 20 MG Tab Take 1 tablet (20 mg total) by mouth daily with dinner 90 tablet 1    rosuvastatin (Crestor) 40 MG tablet Take 1 tablet (40 mg total) by mouth daily 90 tablet 0    sertraline (ZOLOFT) 50 MG tablet Take 1 tablet (50 mg total) by mouth daily 90 tablet 1    tadalafil (CIALIS) 20 MG tablet Take 0.5 tablets (10 mg total) by mouth daily as needed for Erectile Dysfunction 10 tablet 3    valacyclovir (VALTREX) 1000 MG tablet Take 1 tablet (1,000 mg total) by mouth 2 (two) times daily 20 tablet 3    valsartan-hydroCHLOROthiazide (DIOVAN-HCT) 320-12.5 MG per tablet Take 1 tablet by mouth daily 90 tablet 1    vitamin D (CHOLECALCIFEROL) 1000 units tablet Take 5,000 Units by mouth daily       No current facility-administered medications for this visit.        Review of Systems:   A comprehensive review of systems was:   General ROS: throat fullness while swallowing  Respiratory ROS: no cough, shortness of breath, or wheezing  Cardiovascular ROS: no chest pain or dyspnea on exertion  Gastrointestinal ROS: no abdominal pain, change in bowel  habits, or black or bloody stools  Genito-Urinary ROS: no dysuria, trouble voiding, or hematuria. Recurrent herpes simplex attacks  Musculoskeletal ROS: no joint pains or swelling  Neurological ROS: no TIA or stroke symptoms  Dermatologic ROS: negative for rash    Physical Exam:     Vitals:    09/29/19 1405   BP: (!) 145/91   Pulse: 62   SpO2: 100%     BP Readings from Last 3 Encounters:   09/29/19 (!) 145/91   08/28/19 142/89   02/13/19 (!) 145/95     Wt Readings from Last 3 Encounters:   09/29/19 90.7 kg (200 lb)   08/28/19 92.5 kg (204 lb)   02/13/19 92.1 kg (203 lb)   Body mass index is 28.7 kg/m.    General appearance - alert, well appearing, and in no distress  Chest - clear to auscultation, no wheezes, rales or rhonchi, symmetric air entry  Heart - normal rate, regular rhythm, normal S1, S2, no murmurs, rubs, clicks or gallops  Abdomen - soft, nontender, nondistended, no masses or organomegaly  Neurological - alert, oriented, normal speech, no focal findings or movement disorder noted  Extremities - peripheral pulses normal,  no clubbing or cyanosis   trace edema and vertical dermatitis around the right ankle    Labs:   No results found for this or any previous visit (from the past 2016 hour(s)).    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     Answer:   No    Hepatic function panel (LFT)    Lipid panel     Order Specific Question:    Has the patient fasted?     Answer:   No    TSH    Vitamin B12    Iron Deficiency Profile    Hemoglobin A1C       1. 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 Hyperlipidemia but he is not taking his medications as he should I told him not to waste his time if he is taking it intermittently he either commits to taking it or not he verbalizes understanding    3.  Recurrent genital herpes about once a year and he gets Valtrex for 5 days new prescription was given    4.  Hypertension not well-controlled because of poor compliance I admonished his behavior    5.  History of premature ejaculation and he uses Zoloft for that, And Viagra for erectile dysfunction    6.  History of colonic polyps last colonoscopy was in 2017 and he will follow-up in 5 years from that time with Dr. Shelva Majestic    7.  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  History of kidney stones twice that he can remember drinking plenty of fluids    9. Erectile dysfunction and he Wanted an increase in the dose of Cialis therefore we gave him 20 mg    10.  Sensation of fullness in the throat status post steroid treatment by ENT however no improvement they plan on a brother endoscopy  EKG and labs will be faxed with this clearance  He is medically cleared for his surgical procedure    RTC in 3 months      This note was generated by the Epic EMR system/Speech recognition and may contain inherent errors or omissions not intended by the user. Grammatical errors, random word insertions, deletions and pronoun errors are occasional consequences of this technology due to software limitations.   Not all errors are caught or corrected. If there are questions or concerns about the content of this note or information contained within the body of this dictation they should be addressed directly with the author for clarification.      Signed by: Nyra Jabs

## 2019-09-30 LAB — BASIC METABOLIC PANEL
BUN / Creatinine Ratio: 15 (ref 10–24)
BUN: 18 mg/dL (ref 8–27)
CO2: 29 mmol/L (ref 20–29)
Calcium: 9.8 mg/dL (ref 8.6–10.2)
Chloride: 102 mmol/L (ref 96–106)
Creatinine: 1.24 mg/dL (ref 0.76–1.27)
EGFR: 62 mL/min/{1.73_m2} (ref >59)
EGFR: 72 mL/min/{1.73_m2} (ref >59)
Glucose: 102 mg/dL — ABNORMAL HIGH (ref 65–99)
Potassium: 4.1 mmol/L (ref 3.5–5.2)
Sodium: 142 mmol/L (ref 134–144)

## 2019-09-30 LAB — IRON DEFICIENCY PROFILE
Ferritin: 146 ng/mL (ref 30–400)
Iron Saturation: 37 % (ref 15–55)
Iron: 105 ug/dL (ref 38–169)
TIBC: 284 ug/dL (ref 250–450)
UIBC: 179 ug/dL (ref 111–343)

## 2019-09-30 LAB — CBC AND DIFFERENTIAL
Baso(Absolute): 0 10*3/uL (ref 0.0–0.2)
Basos: 0 %
Eos: 0 %
Eosinophils Absolute: 0 10*3/uL (ref 0.0–0.4)
Hematocrit: 48 % (ref 37.5–51.0)
Hemoglobin: 16.9 g/dL (ref 13.0–17.7)
Immature Granulocytes Absolute: 0 10*3/uL (ref 0.0–0.1)
Immature Granulocytes: 0 %
Lymphocytes Absolute: 1.2 10*3/uL (ref 0.7–3.1)
Lymphocytes: 41 %
MCH: 30.9 pg (ref 26.6–33.0)
MCHC: 35.2 g/dL (ref 31.5–35.7)
MCV: 88 fL (ref 79–97)
Monocytes Absolute: 0.3 10*3/uL (ref 0.1–0.9)
Monocytes: 10 %
Neutrophils Absolute: 1.5 10*3/uL (ref 1.4–7.0)
Neutrophils: 49 %
Platelets: 133 10*3/uL — ABNORMAL LOW (ref 150–450)
RBC: 5.47 x10E6/uL (ref 4.14–5.80)
RDW: 13.1 % (ref 11.6–15.4)
WBC: 3 10*3/uL — ABNORMAL LOW (ref 3.4–10.8)

## 2019-09-30 LAB — TSH: TSH: 0.865 u[IU]/mL (ref 0.450–4.500)

## 2019-09-30 LAB — LIPID PANEL
Cholesterol / HDL Ratio: 3.4 ratio (ref 0.0–5.0)
Cholesterol: 259 mg/dL — ABNORMAL HIGH (ref 100–199)
HDL: 77 mg/dL (ref >39)
LDL Chol Calculated (NIH): 170 mg/dL — ABNORMAL HIGH (ref 0–99)
Triglycerides: 71 mg/dL (ref 0–149)
VLDL Calculated: 12 mg/dL (ref 5–40)

## 2019-09-30 LAB — HEMOGLOBIN A1C: Hemoglobin A1C: 5.3 % (ref 4.8–5.6)

## 2019-09-30 LAB — HEPATIC FUNCTION PANEL
ALT: 24 IU/L (ref 0–44)
AST (SGOT): 24 IU/L (ref 0–40)
Albumin: 4.7 g/dL (ref 3.8–4.8)
Alkaline Phosphatase: 98 IU/L (ref 39–117)
Bilirubin Direct: 0.18 mg/dL (ref 0.00–0.40)
Bilirubin, Total: 0.7 mg/dL (ref 0.0–1.2)
Protein, Total: 7.1 g/dL (ref 6.0–8.5)

## 2019-09-30 LAB — VITAMIN B12: Vitamin B-12: 888 pg/mL (ref 232–1245)

## 2019-10-12 ENCOUNTER — Ambulatory Visit (FREE_STANDING_LABORATORY_FACILITY): Payer: BC Managed Care – PPO

## 2019-10-12 DIAGNOSIS — Z1159 Encounter for screening for other viral diseases: Secondary | ICD-10-CM

## 2019-10-12 DIAGNOSIS — Z01818 Encounter for other preprocedural examination: Secondary | ICD-10-CM

## 2019-10-13 LAB — COVID-19 (SARS-COV-2): SARS CoV 2 Overall Result: NOT DETECTED

## 2019-10-15 ENCOUNTER — Encounter: Payer: Self-pay | Admitting: Internal Medicine

## 2019-10-21 ENCOUNTER — Emergency Department
Admission: EM | Admit: 2019-10-21 | Discharge: 2019-10-21 | Disposition: A | Discharge status: Home / Self Care | Payer: BC Managed Care – PPO | Attending: Emergency Medicine | Admitting: Emergency Medicine

## 2019-10-21 ENCOUNTER — Emergency Department: Payer: BC Managed Care – PPO

## 2019-10-21 DIAGNOSIS — Z87442 Personal history of urinary calculi: Secondary | ICD-10-CM | POA: Insufficient documentation

## 2019-10-21 DIAGNOSIS — I10 Essential (primary) hypertension: Secondary | ICD-10-CM | POA: Insufficient documentation

## 2019-10-21 DIAGNOSIS — N132 Hydronephrosis with renal and ureteral calculous obstruction: Secondary | ICD-10-CM | POA: Insufficient documentation

## 2019-10-21 DIAGNOSIS — Z8619 Personal history of other infectious and parasitic diseases: Secondary | ICD-10-CM | POA: Insufficient documentation

## 2019-10-21 DIAGNOSIS — N200 Calculus of kidney: Secondary | ICD-10-CM

## 2019-10-21 DIAGNOSIS — E782 Mixed hyperlipidemia: Secondary | ICD-10-CM | POA: Insufficient documentation

## 2019-10-21 LAB — CBC AND DIFFERENTIAL
Absolute NRBC: 0 10*3/uL (ref 0.00–0.00)
Basophils Absolute Automated: 0.01 10*3/uL (ref 0.00–0.08)
Basophils Automated: 0.1 %
Eosinophils Absolute Automated: 0.02 10*3/uL (ref 0.00–0.44)
Eosinophils Automated: 0.3 %
Hematocrit: 48.4 % (ref 37.6–49.6)
Hgb: 16.6 g/dL (ref 12.5–17.1)
Immature Granulocytes Absolute: 0.03 10*3/uL (ref 0.00–0.07)
Immature Granulocytes: 0.4 %
Lymphocytes Absolute Automated: 1.07 10*3/uL (ref 0.42–3.22)
Lymphocytes Automated: 15.3 %
MCH: 31.1 pg (ref 25.1–33.5)
MCHC: 34.3 g/dL (ref 31.5–35.8)
MCV: 90.6 fL (ref 78.0–96.0)
MPV: 11.3 fL (ref 8.9–12.5)
Monocytes Absolute Automated: 0.5 10*3/uL (ref 0.21–0.85)
Monocytes: 7.2 %
Neutrophils Absolute: 5.35 10*3/uL (ref 1.10–6.33)
Neutrophils: 76.7 %
Nucleated RBC: 0 /100 WBC (ref 0.0–0.0)
Platelets: 145 10*3/uL (ref 142–346)
RBC: 5.34 10*6/uL (ref 4.20–5.90)
RDW: 13 % (ref 11–15)
WBC: 6.98 10*3/uL (ref 3.10–9.50)

## 2019-10-21 LAB — COMPREHENSIVE METABOLIC PANEL
ALT: 29 U/L (ref 0–55)
AST (SGOT): 25 U/L (ref 5–34)
Albumin/Globulin Ratio: 1.4 (ref 0.9–2.2)
Albumin: 4.1 g/dL (ref 3.5–5.0)
Alkaline Phosphatase: 89 U/L (ref 38–106)
Anion Gap: 7 (ref 5.0–15.0)
BUN: 16 mg/dL (ref 9.0–28.0)
Bilirubin, Total: 0.6 mg/dL (ref 0.2–1.2)
CO2: 29 mEq/L (ref 22–29)
Calcium: 9.8 mg/dL (ref 8.5–10.5)
Chloride: 105 mEq/L (ref 100–111)
Creatinine: 1.5 mg/dL — ABNORMAL HIGH (ref 0.7–1.3)
Globulin: 2.9 g/dL (ref 2.0–3.6)
Glucose: 110 mg/dL — ABNORMAL HIGH (ref 70–100)
Potassium: 4.3 mEq/L (ref 3.5–5.1)
Protein, Total: 7 g/dL (ref 6.0–8.3)
Sodium: 141 mEq/L (ref 136–145)

## 2019-10-21 LAB — GFR: EGFR: 47.5

## 2019-10-21 LAB — LIPASE: Lipase: 39 U/L (ref 8–78)

## 2019-10-21 LAB — HEMOLYSIS INDEX: Hemolysis Index: 30 — ABNORMAL HIGH (ref 0–18)

## 2019-10-21 MED ORDER — ONDANSETRON HCL 4 MG/2ML IJ SOLN
4.00 mg | Freq: Once | INTRAMUSCULAR | Status: AC
Start: 2019-10-21 — End: 2019-10-21
  Administered 2019-10-21: 10:00:00 4 mg via INTRAVENOUS
  Filled 2019-10-21: qty 2

## 2019-10-21 MED ORDER — MORPHINE SULFATE 10 MG/ML IJ/IV SOLN (WRAP)
6.0000 mg | Freq: Once | Status: AC
Start: 2019-10-21 — End: 2019-10-21
  Administered 2019-10-21: 10:00:00 6 mg via INTRAVENOUS
  Filled 2019-10-21: qty 1

## 2019-10-21 MED ORDER — KETOROLAC TROMETHAMINE 15 MG/ML IJ SOLN
15.00 mg | Freq: Once | INTRAMUSCULAR | Status: AC
Start: 2019-10-21 — End: 2019-10-21
  Administered 2019-10-21: 10:00:00 15 mg via INTRAVENOUS
  Filled 2019-10-21: qty 1

## 2019-10-21 MED ORDER — ONDANSETRON 4 MG PO TBDP
4.00 mg | ORAL_TABLET | Freq: Four times a day (QID) | ORAL | 0 refills | Status: DC | PRN
Start: 2019-10-21 — End: 2019-12-24

## 2019-10-21 MED ORDER — OXYCODONE-ACETAMINOPHEN 5-325 MG PO TABS
1.00 | ORAL_TABLET | ORAL | 0 refills | Status: AC | PRN
Start: 2019-10-21 — End: 2019-10-28

## 2019-10-21 NOTE — ED Provider Notes (Signed)
EMERGENCY DEPARTMENT HISTORY AND PHYSICAL EXAM     None        Date: 10/21/2019  Patient Name: [name]    History of Presenting Illness     Chief Complaint   Patient presents with    Flank Pain       History Provided By: patient    Chief Complaint: Flank pain       Additional History: [name] is a 63 y.o. male presenting to the ED with severe, intermittent, right flank pain that is nonradiating for the past 3 days.  Patient states he has had kidney stones twice previously and this pain is similar to the pain he had with prior kidney stones.  He has some mild nausea but no vomiting.  In between episodes of pain he does not feel that bad.  He came to the ER this morning because the pain is currently severe.  No clear exacerbating or alleviating factors.  No associated fever.      PCP: Steven Jabs, MD  SPECIALISTS:    No current facility-administered medications for this encounter.      Current Outpatient Medications   Medication Sig Dispense Refill    Multiple Vitamins-Minerals (MULTIVITAMIN WITH MINERALS) tablet Take 1 tablet by mouth daily      ondansetron (ZOFRAN-ODT) 4 MG disintegrating tablet Take 1 tablet (4 mg total) by mouth every 6 (six) hours as needed for Nausea 8 tablet 0    oxyCODONE-acetaminophen (PERCOCET) 5-325 MG per tablet Take 1 tablet by mouth every 4 (four) hours as needed for Pain 12 tablet 0    rivaroxaban (Xarelto) 20 MG Tab Take 1 tablet (20 mg total) by mouth daily with dinner 90 tablet 1    rosuvastatin (Crestor) 40 MG tablet Take 1 tablet (40 mg total) by mouth daily 90 tablet 0    sertraline (ZOLOFT) 50 MG tablet Take 1 tablet (50 mg total) by mouth daily 90 tablet 1    tadalafil (CIALIS) 20 MG tablet Take 0.5 tablets (10 mg total) by mouth daily as needed for Erectile Dysfunction 10 tablet 3    valacyclovir (VALTREX) 1000 MG tablet Take 1 tablet (1,000 mg total) by mouth 2 (two) times daily 20 tablet 3    valsartan-hydroCHLOROthiazide (DIOVAN-HCT) 320-12.5  MG per tablet Take 1 tablet by mouth daily 90 tablet 1    vitamin D (CHOLECALCIFEROL) 1000 units tablet Take 5,000 Units by mouth daily         Past History     Past Medical History:  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    x2 last 2018    H/O lumbar discectomy 08/12/2018    2011     H/O: vasectomy 08/12/2018    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 No pertinent family history.    Social History:  Social History     Tobacco Use    Smoking status: Never Smoker    Smokeless tobacco: Never Used   Substance Use Topics    Alcohol use: Yes    Drug use: No       Allergies:  No Known Allergies    Review of Systems     Review of Systems     Constitutional: Negative for fever and fatigue.   HENT: Negative for rhinorrhea and sore throat.    Eyes: Negative for discharge, redness and visual disturbance.   Respiratory: Negative for cough and shortness of breath.    Cardiovascular: Negative for chest pain and leg swelling.   Gastrointestinal: Negative for nausea and abdominal pain.   Endocrine: Negative for polyuria.   Genitourinary: Negative for dysuria, urgency, frequency. (+) flank pain.   Musculoskeletal: Negative for back pain and neck pain.   Skin: Negative for rash.   Allergic/Immunologic: Negative for immunocompromised state.   Neurological:  Negative for light-headedness and headaches.   Hematological: (+) bruise/bleeds easily.   Psychiatric/Behavioral: Negative for suicidal ideas.     Physical Exam   BP 137/87    Pulse 97    Temp 98 F (36.7 C)    Resp 20    Ht 5\' 10"  (1.778 m)    Wt 90.7 kg    SpO2 99%    BMI 28.70 kg/m     Constitutional: Vital signs reviewed. Uncomfortable appearing. Walking restlessly around room, holding R flank.   Head: Normocephalic, atraumatic  Eyes: Conjunctiva and sclera are normal.  No injection or discharge.  Ears, Nose, Throat:  Normal external examination of the nose and ears. No throat or oropharyngeal swelling or erythema. Midline uvula. Mucous membranes moist.  Neck: Normal range of motion. Supple, no meningeal signs. Trachea midline. No stridor. No JVD  Respiratory/Chest: Clear to auscultation. No respiratory distress.   Cardiovascular: Regular rate and rhythm. No murmurs.  Abdomen:  Bowel sounds intact. No rebound or guarding. Soft.  Non-tender.  Back: (+) R CVA tenderness to percussion. No L CVA tenderness. No focal CTLS tenderness.  Upper Extremity:  No edema. No cyanosis. Bilateral radial pulses intact and equal.   Lower Extremity:  No edema. No cyanosis. Bilateral calves symmetrical and non-tender. Bilateral DP, PT pulses intact and equal.  Skin: Warm and dry. No rash.  Neuro: A&Ox3. Moves all extremities spontaneously  Psychiatric: Anxious affect.  Normal insight.        Diagnostic Study Results     Labs -     Results     Procedure Component Value Units Date/Time    Hemolysis index [[id number]]  (Abnormal) Collected: 10/21/19 0931     Updated: 10/21/19 0956     Hemolysis Index 30    Narrative:      Replace urinary catheter prior to obtaining the urine culture  if it has been in place for greater than or equal to 14  days:->N/A No Foley  Indications for U/A Reflex to Micro - Reflex to  Culture:->Suprapubic Pain/Tenderness or Dysuria    GFR [[id number]] Collected: 10/21/19 0931     Updated: 10/21/19 0956     EGFR  47.5    Narrative:      Replace urinary catheter prior to obtaining the urine culture  if it has been in place for greater than or  equal to 14  days:->N/A No Foley  Indications for U/A Reflex to Micro - Reflex to  Culture:->Suprapubic Pain/Tenderness or Dysuria    Comprehensive metabolic panel [[id number]]  (Abnormal) Collected: 10/21/19 0931    Specimen: Blood Updated: 10/21/19 0956     Glucose 110 mg/dL      BUN 65.7 mg/dL      Creatinine 1.5 mg/dL      Sodium 846 mEq/L      Potassium 4.3 mEq/L      Chloride 105 mEq/L      CO2 29 mEq/L      Calcium 9.8 mg/dL      Protein, Total 7.0 g/dL      Albumin 4.1 g/dL      AST (SGOT) 25 U/L      ALT 29 U/L      Alkaline Phosphatase 89 U/L      Bilirubin, Total 0.6 mg/dL      Globulin 2.9 g/dL      Albumin/Globulin Ratio 1.4     Anion Gap 7.0    Narrative:      Replace urinary catheter prior to obtaining the urine culture  if it has been in place for greater than or equal to 14  days:->N/A No Foley  Indications for U/A Reflex to Micro - Reflex to  Culture:->Suprapubic Pain/Tenderness or Dysuria    Lipase [[id number]] Collected: 10/21/19 0931    Specimen: Blood Updated: 10/21/19 0956     Lipase 39 U/L     Narrative:      Replace urinary catheter prior to obtaining the urine culture  if it has been in place for greater than or equal to 14  days:->N/A No Foley  Indications for U/A Reflex to Micro - Reflex to  Culture:->Suprapubic Pain/Tenderness or Dysuria    CBC and differential [[id number]] Collected: 10/21/19 0931    Specimen: Blood Updated: 10/21/19 0942     WBC 6.98 x10 3/uL      Hgb 16.6 g/dL      Hematocrit 25.3 %      Platelets 145 x10 3/uL      RBC 5.34 x10 6/uL      MCV 90.6 fL      MCH 31.1 pg      MCHC 34.3 g/dL      RDW 13 %      MPV 11.3 fL      Neutrophils 76.7 %      Lymphocytes Automated 15.3 %      Monocytes 7.2 %      Eosinophils Automated 0.3 %      Basophils Automated 0.1 %      Immature Granulocytes 0.4 %      Nucleated RBC 0.0 /100 WBC      Neutrophils  Absolute 5.35 x10 3/uL      Lymphocytes Absolute Automated 1.07 x10 3/uL      Monocytes Absolute Automated 0.50 x10 3/uL      Eosinophils Absolute Automated 0.02 x10 3/uL      Basophils Absolute Automated 0.01 x10 3/uL      Immature Granulocytes Absolute 0.03 x10 3/uL      Absolute NRBC 0.00 x10 3/uL     Narrative:      Replace urinary catheter prior to obtaining the urine culture  if it has been in place for greater than or equal to 14  days:->N/A No Foley  Indications for U/A Reflex to Micro - Reflex to  Culture:->Suprapubic Pain/Tenderness or Dysuria  Radiologic Studies -   Radiology Results (24 Hour)     Procedure Component Value Units Date/Time    CT Abd/Pelvis without Contrast [[id number]] Collected: 10/21/19 1025    Order Status: Completed Updated: 10/21/19 1031    Narrative:      HISTORY: Severe right flank pain    TECHNIQUE: Nonenhanced, abdominal and pelvic computed tomography was  performed at 3 mm thickness. . A combination of automatic exposure  control, adjustment of the mA a and/or KVP according to the patient's  size and or use of iterative reconstruction technique was utilized.    PRIOR: No prior studies.    FINDINGS:   The evaluation of the solid organs is limited due to lack of intravenous  contrast.  There are at least 2, tiny, nonobstructive right renal stones with the  largest one measuring approximately 1 mm. There is an approximately 2.5  mm right UVJ stone resulting in mild ipsilateral hydronephrosis.    There is presence of at least 2, nonobstructive left renal stones with  the largest one measuring approximately 2.5 mm. Bilateral renal cysts  are present.  The liver, spleen and pancreas are unremarkable.   There is no dilatation of the intra or extrahepatic bile ducts.   The gallbladder is unremarkable.     The gastrointestinal tract is within normal limits. There is no evidence  of gastrointestinal obstruction.  The adrenal gland are mildly, diffusely enlarged likely  reflecting  hyperplasia.    The great vessels are normal in caliber. There is no evidence of  ascites.    The pelvis show no evidence of pelvic masses or collection.  The urinary bladder is unremarkable.       Impression:        1. Right UVJ stone with ipsilateral hydronephrosis. Bilateral, tiny,  nonobstructive renal stones.    2. Mild prominence of the bilateral adrenal glands, likely hyperplasia.    Georgana Curio, MD   10/21/2019 10:29 AM      .    Medical Decision Making   I am the first provider for this patient.    I reviewed the vital signs, available nursing notes, past medical history, past surgical history, family history and social history.    Vital Signs-Reviewed the patient's vital signs.     Patient Vitals for the past 12 hrs:   BP Temp Pulse Resp   10/21/19 0914 137/87 98 F (36.7 C) 97 20                 ED Course:     1126 - symptoms resolved. Pt feeling much better. Discussed results with pt and counseled on diagnosis, f/u plans, medication use, supportive care, and signs and symptoms when to return to ED immediately. Pt voices understanding and agreement with plan. All questions and concerns addressed.          Diagnosis     Clinical Impression:   1. Kidney stone        Treatment Plan:   ED Disposition     ED Disposition Condition Date/Time Comment    Discharge  Wed Oct 21, 2019 11:26 AM [name] discharge to home/self care.    Condition at disposition: Stable            _______________________________    This note was generated by the Epic EMR system/ Dragon speech recognition and may contain inherent errors or omissions not intended by the user. Grammatical errors, random word insertions, deletions and pronoun errors  are occasional consequences of this technology due to software limitations. Not all errors are caught or corrected. If there are questions or concerns about the content of this note or information contained within the body of this dictation they should be addressed  directly with the author for clarification.      Attestations: This note is prepared by Lynnea Ferrier, MD    _______________________________       Maryella Shivers, MD  10/21/19 [phone]

## 2019-10-21 NOTE — Discharge Instructions (Signed)
Kidney Stones     You have been seen for a kidney stone.     A kidney stone is a hard mineral and crystalline material (a lot like gravel). It forms inside the kidney or the urinary tract. When it moves from the kidney into the tube between the kidney and the bladder (the ureter), it causes severe pain. Kidney stones form when there is less urine (pee) or too many stone-forming substances in the urine. Normally, kidney stones are made of calcium oxalate or calcium phosphate. Kidney stones associated with infection in the urinary tract are called struvite or infection stones.     Symptoms include sharp pain in the side that may radiate (spread) to the groin. Nausea and vomiting are also common. A doctor diagnosed your kidney stone based on your exam, urine test, and medical history. The doctor may have run a special test called a helical CT stone study or an intravenous pyelogram (IVP).     Men get kidney stones more often than women. Kidney stones become more common when men reach their 40s. The risk gets higher with age. People who have already had more than one kidney stone often get more stones. Kidney stones are more common in Caucasians and Asians than in African Americans.      There are different conditions that can cause kidney stones: Hypercalcuria is an inherited (genetic) condition that causes high calcium in the urine. This causes stones in more than half of cases. There are other conditions that cause an increased risk of kidney stones. These include gout (a joint condition), hyperparathyroidism (a hormone condition), inflammatory bowel disease (Crohn’s disease and Ulcerative colitis) and intestinal bypass surgery. They also include kidney diseases like renal tubular acidosis. Certain medicines also increase the risk of kidney stones. These include some diuretics (water pills), antacids with calcium, and the HIV drug indinavir (Crixivan®).     Most kidney stones pass on their own. Larger stones or  stones that don’t pass in a few days may need to be taken out. This is done by a urologist (a doctor who specializes in the urinary tract). Kidney stones are normally treated with pain and nausea medicine.     You should drink lots of fluid--up to 8 glasses of water a day. You should follow up with a urologist in the next 2-3 weeks. Strain all of your urine so you can catch the kidney stone as it passes out of the bladder. Keep the stone and bring it with you to your urology appointment. The urologist may have the stone tested to find out what it is made of. This may help the urologist recommend diet changes. He or she may also suggest medicines to help prevent more kidney stones.     YOU SHOULD SEEK MEDICAL ATTENTION IMMEDIATELY, EITHER HERE OR AT THE NEAREST EMERGENCY DEPARTMENT, IF ANY OF THE FOLLOWING OCCURS:  · The pain gets worse or the medicine isn’t enough to treat your pain.  · You get sick (nausea) or vomit and can't keep down fluids or pain medicine.  · You have a fever (temperature higher than 100.4ºF / 38ºC) or shaking chills.

## 2019-10-21 NOTE — ED Triage Notes (Signed)
[facility] EMERGENCY DEPARTMENT TRIAGE NOTE     [name] is a 63 y.o. male who presents to the ED with a chief complaint of: flank pain on and off for about a week. Hx Kidney stones            LOC: AAo x4    Vitals: Blood pressure 137/87, pulse 97, temperature 98 F (36.7 C), resp. rate 20, height 5\' 10"  (1.778 m), weight 90.7 kg, SpO2 99 %.    Meds taken prior to ED arrival: none

## 2019-11-18 ENCOUNTER — Other Ambulatory Visit: Payer: Self-pay | Admitting: Internal Medicine

## 2019-11-27 ENCOUNTER — Telehealth: Payer: BC Managed Care – PPO | Admitting: Internal Medicine

## 2019-11-27 ENCOUNTER — Encounter: Payer: Self-pay | Admitting: Internal Medicine

## 2019-11-27 DIAGNOSIS — I10 Essential (primary) hypertension: Secondary | ICD-10-CM

## 2019-11-27 MED ORDER — METRONIDAZOLE 250 MG PO TABS
250.00 mg | ORAL_TABLET | Freq: Three times a day (TID) | ORAL | 0 refills | Status: AC
Start: 2019-11-27 — End: 2019-12-07

## 2019-11-27 NOTE — Progress Notes (Signed)
Telemedicine visit      Date Time: 11/27/2019 11:42 AM  Patient Name: [name], [name]   DOB: [birth year]    Subjective:   [name] is a 63 y.o. 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    x2 last 2018    H/O lumbar discectomy 08/12/2018    2011     H/O: vasectomy 08/12/2018    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    Hyperplastic colonic polyp 08/12/2018    2017 return in 5 years    Hypertension     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    Mixed hyperlipidemia 08/12/2018    Nephrolithiasis 08/12/2018    x2 last 2018     Premature ejaculation 08/12/2018    Preop examination 09/29/2019    Throat fullness 08/28/2019       Past Surgical History:     Past Surgical History:   Procedure Laterality Date    BUNIONECTOMY  02/09/2015    COLONOSCOPY  07/2007    VASECTOMY  05/1992       Family History:   No family history on file.    Social History:     Social History     Socioeconomic History    Marital status: Divorced     Spouse name: None    Number of children: None    Years of education: None    Highest education level: None   Occupational History    None   Social Engineer, site strain: None    Food insecurity     Worry: None     Inability: None    Transportation needs     Medical: None     Non-medical: None   Tobacco Use    Smoking status: Never Smoker    Smokeless tobacco: Never Used   Substance and Sexual Activity    Alcohol use: Yes    Drug use: No    Sexual activity: None   Lifestyle    Physical  activity     Days per week: None     Minutes per session: None    Stress: None   Relationships    Social connections     Talks on phone: None     Gets together: None     Attends religious service: None     Active member of club or organization: None     Attends meetings of clubs or organizations: None     Relationship status: None    Intimate partner violence     Fear of current or ex partner: None     Emotionally abused: None     Physically abused: None     Forced sexual activity: None   Other Topics Concern    None   Social History Narrative    None       Allergies:   No Known Allergies    Medications:  Current Outpatient Medications   Medication Sig Dispense Refill    Multiple Vitamins-Minerals (MULTIVITAMIN WITH MINERALS) tablet Take 1 tablet by mouth daily      rivaroxaban (Xarelto) 20 MG Tab Take 1 tablet (20 mg total) by mouth daily with dinner 90 tablet 1    rosuvastatin (CRESTOR) 40 MG tablet TAKE 1 TABLET BY MOUTH EVERY DAY 90 tablet 0    sertraline (ZOLOFT) 50 MG tablet Take 1 tablet (50 mg total) by mouth daily 90 tablet 1    tadalafil (CIALIS) 20 MG tablet Take 0.5 tablets (10 mg total) by mouth daily as needed for Erectile Dysfunction 10 tablet 3    valacyclovir (VALTREX) 1000 MG tablet Take 1 tablet (1,000 mg total) by mouth 2 (two) times daily 20 tablet 3    valsartan-hydroCHLOROthiazide (DIOVAN-HCT) 320-12.5 MG per tablet Take 1 tablet by mouth daily 90 tablet 1    vitamin D (CHOLECALCIFEROL) 1000 units tablet Take 5,000 Units by mouth daily      metroNIDAZOLE (Flagyl) 250 MG tablet Take 1 tablet (250 mg total) by mouth 3 (three) times daily for 10 days 30 tablet 0    ondansetron (ZOFRAN-ODT) 4 MG disintegrating tablet Take 1 tablet (4 mg total) by mouth every 6 (six) hours as needed for Nausea 8 tablet 0     No current facility-administered medications for this visit.        Review of Systems:   A comprehensive review of systems was:   General ROS:   Respiratory ROS: no cough,  shortness of breath, or wheezing  Cardiovascular ROS: no chest pain or dyspnea on exertion  Gastrointestinal ROS: 4-5 times diarrhea for the last 6 weeks or so  Genito-Urinary ROS: no dysuria, trouble voiding, or hematuria. Recurrent herpes simplex attacks  Musculoskeletal ROS: no joint pains or swelling  Neurological ROS: no TIA or stroke symptoms  Dermatologic ROS: negative for rash    Physical Exam:     There were no vitals filed for this visit.  BP Readings from Last 3 Encounters:   10/21/19 137/87   09/29/19 (!) 145/91   08/28/19 142/89     Wt Readings from Last 3 Encounters:   11/27/19 89.8 kg (198 lb)   10/21/19 90.7 kg (200 lb)   09/29/19 90.7 kg (200 lb)   Body mass index is 28.41 kg/m.    On the visual consultation was conducted during this telemedicine visit    Labs:     Recent Results (from the past 2016 hour(s))   CBC and differential    Collection Time: 09/29/19  2:20 PM   Result Value Ref Range    WBC 3.0 (L) 3.4 - 10.8 x10E3/uL    RBC 5.47 4.14 - 5.80 x10E6/uL    Hemoglobin 16.9 13.0 - 17.7 g/dL    Hematocrit 29.5 62.1 - 51.0 %    MCV 88 79 - 97 fL    MCH 30.9 26.6 - 33.0 pg    MCHC 35.2 31.5 - 35.7 g/dL    RDW 30.8 65.7 - 84.6 %    Platelets 133 (L) 150 - 450 x10E3/uL    Neutrophils 49 Not Estab. %    Lymphocytes 41 Not Estab. %    Monocytes 10 Not Estab. %    Eos 0 Not Estab. %    Basos 0 Not Estab. %    Neutrophils Absolute 1.5 1.4 - 7.0 x10E3/uL    Lymphocytes Absolute 1.2 0.7 - 3.1 x10E3/uL    Monocytes Absolute 0.3 0.1 -  0.9 x10E3/uL    Eosinophils Absolute 0.0 0.0 - 0.4 x10E3/uL    Baso(Absolute) 0.0 0.0 - 0.2 x10E3/uL    Immature Granulocytes 0 Not Estab. %    Immature Granulocytes Absolute 0.0 0.0 - 0.1 x10E3/uL   Basic Metabolic Panel    Collection Time: 09/29/19  2:20 PM   Result Value Ref Range    Glucose 102 (H) 65 - 99 mg/dL    BUN 18 8 - 27 mg/dL    Creatinine 1.61 0.96 - 1.27 mg/dL    EGFR 62 >04 VW/UJW/1.19    EGFR 72 >59 mL/min/1.73    BUN / Creatinine Ratio 15 10 - 24    Sodium  142 134 - 144 mmol/L    Potassium 4.1 3.5 - 5.2 mmol/L    Chloride 102 96 - 106 mmol/L    CO2 29 20 - 29 mmol/L    Calcium 9.8 8.6 - 10.2 mg/dL   Hepatic function panel (LFT)    Collection Time: 09/29/19  2:20 PM   Result Value Ref Range    Protein, Total 7.1 6.0 - 8.5 g/dL    Albumin 4.7 3.8 - 4.8 g/dL    Bilirubin, Total 0.7 0.0 - 1.2 mg/dL    Bilirubin Direct 1.47 0.00 - 0.40 mg/dL    Alkaline Phosphatase 98 39 - 117 IU/L    AST (SGOT) 24 0 - 40 IU/L    ALT 24 0 - 44 IU/L   Lipid panel    Collection Time: 09/29/19  2:20 PM   Result Value Ref Range    Cholesterol 259 (H) 100 - 199 mg/dL    Triglycerides 71 0 - 149 mg/dL    HDL 77 >82 mg/dL    VLDL Calculated 12 5 - 40 mg/dL    LDL Chol Calculated (NIH) 956 (H) 0 - 99 mg/dL    Cholesterol / HDL Ratio 3.4 0.0 - 5.0 ratio   TSH    Collection Time: 09/29/19  2:20 PM   Result Value Ref Range    TSH 0.865 0.450 - 4.500 uIU/mL   Vitamin B12    Collection Time: 09/29/19  2:20 PM   Result Value Ref Range    Vitamin B-12 888 232 - 1,245 pg/mL   Iron Deficiency Profile    Collection Time: 09/29/19  2:20 PM   Result Value Ref Range    TIBC 284 250 - 450 ug/dL    UIBC 213 086 - 578 ug/dL    Iron 469 38 - 629 ug/dL    Iron Saturation 37 15 - 55 %    Ferritin 146 30 - 400 ng/mL   Hemoglobin A1C    Collection Time: 09/29/19  2:20 PM   Result Value Ref Range    Hemoglobin A1C 5.3 4.8 - 5.6 %   COVID-19 (SARS-CoV-2) Pre-Procedure, Team Member/First Responder    Collection Time: 10/12/19  7:08 AM    Specimen: Nasopharynx; Nasopharyngeal Swab   Result Value Ref Range    Purpose of COVID testing Screening     SARS-CoV-2 Specimen Source Nasopharyngeal     SARS CoV 2 Overall Result Not Detected    CBC and differential    Collection Time: 10/21/19  9:31 AM   Result Value Ref Range    WBC 6.98 3.10 - 9.50 x10 3/uL    Hgb 16.6 12.5 - 17.1 g/dL    Hematocrit 52.8 41.3 - 49.6 %    Platelets 145 142 - 346 x10 3/uL    RBC  5.34 4.20 - 5.90 x10 6/uL    MCV 90.6 78.0 - 96.0 fL    MCH 31.1 25.1 -  33.5 pg    MCHC 34.3 31.5 - 35.8 g/dL    RDW 13 11 - 15 %    MPV 11.3 8.9 - 12.5 fL    Neutrophils 76.7 None %    Lymphocytes Automated 15.3 None %    Monocytes 7.2 None %    Eosinophils Automated 0.3 None %    Basophils Automated 0.1 None %    Immature Granulocytes 0.4 None %    Nucleated RBC 0.0 0.0 - 0.0 /100 WBC    Neutrophils Absolute 5.35 1.10 - 6.33 x10 3/uL    Lymphocytes Absolute Automated 1.07 0.42 - 3.22 x10 3/uL    Monocytes Absolute Automated 0.50 0.21 - 0.85 x10 3/uL    Eosinophils Absolute Automated 0.02 0.00 - 0.44 x10 3/uL    Basophils Absolute Automated 0.01 0.00 - 0.08 x10 3/uL    Immature Granulocytes Absolute 0.03 0.00 - 0.07 x10 3/uL    Absolute NRBC 0.00 0.00 - 0.00 x10 3/uL   Comprehensive metabolic panel    Collection Time: 10/21/19  9:31 AM   Result Value Ref Range    Glucose 110 (H) 70 - 100 mg/dL    BUN 40.9 9.0 - 81.1 mg/dL    Creatinine 1.5 (H) 0.7 - 1.3 mg/dL    Sodium 914 782 - 956 mEq/L    Potassium 4.3 3.5 - 5.1 mEq/L    Chloride 105 100 - 111 mEq/L    CO2 29 22 - 29 mEq/L    Calcium 9.8 8.5 - 10.5 mg/dL    Protein, Total 7.0 6.0 - 8.3 g/dL    Albumin 4.1 3.5 - 5.0 g/dL    AST (SGOT) 25 5 - 34 U/L    ALT 29 0 - 55 U/L    Alkaline Phosphatase 89 38 - 106 U/L    Bilirubin, Total 0.6 0.2 - 1.2 mg/dL    Globulin 2.9 2.0 - 3.6 g/dL    Albumin/Globulin Ratio 1.4 0.9 - 2.2    Anion Gap 7.0 5.0 - 15.0   Lipase    Collection Time: 10/21/19  9:31 AM   Result Value Ref Range    Lipase 39 8 - 78 U/L   Hemolysis index    Collection Time: 10/21/19  9:31 AM   Result Value Ref Range    Hemolysis Index 30 (H) 0 - 18   GFR    Collection Time: 10/21/19  9:31 AM   Result Value Ref Range    EGFR 47.5        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      1. 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 Hyperlipidemia but he is not taking his medications as he should I told him not to waste his time if he is taking it intermittently he either commits to taking it or not he verbalizes understanding    3.  Recurrent genital herpes about once a year and he gets Valtrex for 5 days new prescription was given    4.  Hypertension not well-controlled because of poor compliance I admonished his behavior    5.  History of premature ejaculation and he uses Zoloft for that, And Viagra for erectile dysfunction    6.  History of colonic polyps last colonoscopy was in 2017 and he will follow-up in 5 years from that time with Dr. Shelva Majestic    7.  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  History of kidney stones twice that he can remember drinking plenty of fluids    9. Erectile dysfunction and he Wanted an increase in the dose of Cialis therefore we gave him 20 mg    10.  Sensation of fullness in the throat status post steroid treatment by ENT however no improvement he was supposed to get a panendoscopy by Dr. Harrold Donath we did not discuss this today    11.  Diarrhea 4-5 times for the last 6 weeks after receiving antibiotics for tooth extraction  Flagyl 250 mg 3 times a day for 10 days and let me know how he is doing    RTC as previously scheduled      This note was generated by the [employer] EMR system/Speech recognition and may contain inherent errors or omissions not intended by the user. Grammatical errors, random word insertions, deletions and pronoun errors are occasional consequences of this technology due to software limitations.   Not  all errors are caught or corrected. If there are questions or concerns about the content of this note or information contained within the body of this dictation they should be addressed directly with the author for clarification.      Signed by: Nyra Jabs

## 2019-12-21 ENCOUNTER — Other Ambulatory Visit: Payer: Self-pay | Admitting: Internal Medicine

## 2019-12-21 ENCOUNTER — Encounter (INDEPENDENT_AMBULATORY_CARE_PROVIDER_SITE_OTHER): Payer: Self-pay

## 2019-12-21 DIAGNOSIS — K591 Functional diarrhea: Secondary | ICD-10-CM

## 2019-12-24 ENCOUNTER — Encounter (INDEPENDENT_AMBULATORY_CARE_PROVIDER_SITE_OTHER): Payer: Self-pay | Admitting: Internal Medicine

## 2019-12-24 ENCOUNTER — Telehealth (INDEPENDENT_AMBULATORY_CARE_PROVIDER_SITE_OTHER): Payer: BC Managed Care – PPO | Admitting: Internal Medicine

## 2019-12-24 VITALS — Ht 70.0 in | Wt 202.0 lb

## 2019-12-24 DIAGNOSIS — R197 Diarrhea, unspecified: Secondary | ICD-10-CM

## 2019-12-24 DIAGNOSIS — Z7902 Long term (current) use of antithrombotics/antiplatelets: Secondary | ICD-10-CM

## 2019-12-24 DIAGNOSIS — I82411 Acute embolism and thrombosis of right femoral vein: Secondary | ICD-10-CM

## 2019-12-24 DIAGNOSIS — Z01812 Encounter for preprocedural laboratory examination: Secondary | ICD-10-CM

## 2019-12-24 MED ORDER — SUPREP BOWEL PREP KIT 17.5-3.13-1.6 GM/177ML PO SOLN
ORAL | 0 refills | Status: DC
Start: 2019-12-24 — End: 2021-09-06

## 2019-12-24 NOTE — Progress Notes (Addendum)
[address], [address]  [place], [place].  [address]  Phone:[phone]  Fax:[phone]      NEW PATIENT VISIT    Date of Visit: 12/24/2019 12:38 PM  Patient ID: [name] is a 63 y.o. male.  Referring Physician: @REFPROVFL @     Telehealth: Verbal consent has been obtained from the patient to conduct a video and telephone visit to minimize exposure to COVID-19: yes     Reason for Consultation:   Diarrhea    History:   [name] is a 63 y.o. male who presented for evaluation of chronic diarrhea.    He received a course of clindamycin for prophylaxis after tooth extraction in September 2020.  Since then he has had a chronic diarrhea with on average 4-5 loose/watery stools per day.  The diarrhea was not associated with any specific food triggers or eating. He was initiated on amoxicillin on Monday for an 8 days course for prophylaxis after tooth implant placement. He denied seeing blood in the stool, abdominal pain, nausea, vomiting, constipation, unintentional weight loss, fever or night sweats.  He has a history of colon polyps during his first colonoscopy in 2008.  Last colonoscopy in 2017 was reportedly unremarkable.  We do not have the report.  He denied family history of colon cancer.    He has history of DVT and takes Xarelto for long-term anticoagulation.    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    Hypertension    H/O lumbar discectomy    Nephrolithiasis    Mixed hyperlipidemia    Premature ejaculation    Herpes simplex infection of penis    LAFB (left anterior fascicular block)    Throat fullness    COVID-19 virus infection    Adenomatous polyp of colon    Preop examination       Past Medical History:     Past Medical History:   Diagnosis Date    Adenomatous polyp of colon 08/28/2019    COVID-19 virus infection 08/28/2019    07/06/2019    Deep venous  thrombosis of right profunda femoris vein 08/12/2018    x2 last 2018    H/O lumbar discectomy 08/12/2018    2011     H/O: vasectomy 08/12/2018    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    Hyperplastic colonic polyp 08/12/2018    2017 return in 5 years    Hypertension     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    Mixed hyperlipidemia 08/12/2018    Nephrolithiasis 08/12/2018    x2 last 2018     Premature ejaculation 08/12/2018    Preop examination 09/29/2019    Throat fullness 08/28/2019       Past Surgical History:     Past Surgical History:   Procedure Laterality Date    BUNIONECTOMY  02/09/2015    COLONOSCOPY  07/2007    VASECTOMY  05/1992       Current Medications:   He  has a current medication list which includes the following prescription(s): multivitamin with minerals, rivaroxaban, rosuvastatin, sertraline, tadalafil, valacyclovir, valsartan-hydrochlorothiazide, vitamin d, and suprep bowel prep kit.     Outpatient Medications Marked as Taking for the 12/24/19 encounter (Telemedicine Visit) with Einar Grad, MD   Medication Sig Dispense Refill    Multiple Vitamins-Minerals (MULTIVITAMIN WITH MINERALS) tablet Take  1 tablet by mouth daily      rivaroxaban (Xarelto) 20 MG Tab Take 1 tablet (20 mg total) by mouth daily with dinner 90 tablet 1    rosuvastatin (CRESTOR) 40 MG tablet TAKE 1 TABLET BY MOUTH EVERY DAY 90 tablet 0    sertraline (ZOLOFT) 50 MG tablet Take 1 tablet (50 mg total) by mouth daily 90 tablet 1    tadalafil (CIALIS) 20 MG tablet Take 0.5 tablets (10 mg total) by mouth daily as needed for Erectile Dysfunction 10 tablet 3    valacyclovir (VALTREX) 1000 MG tablet Take 1 tablet (1,000 mg total) by mouth 2 (two) times daily 20 tablet 3    valsartan-hydroCHLOROthiazide (DIOVAN-HCT) 320-12.5 MG per tablet Take 1 tablet by mouth daily 90 tablet 1    vitamin D (CHOLECALCIFEROL) 1000 units tablet Take 5,000 Units by mouth daily         Allergies:   No Known Allergies    Family  History:     Family History   Problem Relation Age of Onset    Stroke Mother        Social History:     Social History     Tobacco Use    Smoking status: Never Smoker    Smokeless tobacco: Never Used   Substance Use Topics    Alcohol use: Yes    Drug use: No       Review of Systems:   Constitutional: Denies fever, chills or weight loss.  Eyes: Denies Blurred vision, eye discharge or eye pain.   ENMT: Denies changes in hearing, nasal discharge, oral lesions or sore throat.  Respiratory: Denies wheezing or cough.  Cardiovascular: Denies chest pain or palpitations.  Gastrointestinal: See HPI.  Genitourinary: Denies gross hematuria or dark urine.  Musculoskeletal: Denies back pain or joint pain.  Neurologic: Denies slurred speech, hemiplegia or focal deficit.  Psychiatric: Denies depression or anxiety.   Hematologic: Denies easy bruising.  Integumentary:  Denies skin rashes or Jaundice.    Vital Signs:   Ht 1.778 m (5\' 10" )    Wt 91.6 kg (202 lb)    BMI 28.98 kg/m      Weight Monitoring 10/21/2019 11/27/2019 12/24/2019   Height 177.8 cm 177.8 cm 177.8 cm   Weight 90.719 kg 89.812 kg 91.627 kg   BMI (calculated) 28.8 kg/m2 28.5 kg/m2 29 kg/m2          Physical Exam:   General appearance - Well developed and well nourished, no acute distress.  No obvious abnormalities on video exam.    Labs Reviewed:     Recent Labs     10/21/19  0931 09/29/19  1420 11/07/18  1029   WBC 6.98 3.0* 3.0*   Hemoglobin  --  16.9 18.1*   Hgb 16.6  --   --    Hematocrit 48.4 48.0 53.3*   Platelets 145 133* 135*       No results for input(s): PT, INR in the last 306600 hours.    Recent Labs     10/21/19  0931 09/29/19  1420 11/07/18  1029   Sodium 141 142 140   Potassium 4.3 4.1 4.2   Chloride 105 102 98   CO2 29 29 27    BUN 16.0 18 16   Creatinine 1.5* 1.24 1.40*   Calcium 9.8 9.8 10.0   Albumin 4.1 4.7 4.8   Protein, Total 7.0 7.1 7.4   Bilirubin, Total 0.6 0.7 0.6   Alkaline Phosphatase 89 98  93   ALT 29 24 29    AST (SGOT) 25 24 26     Glucose 110* 102* 75   Lipase 39  --   --    TSH  --  0.865 1.200   Ferritin  --  146 51       Rads:   No results found.    GI Procedures:       Assessment and Plan:     1. Diarrhea, unspecified type  - Na sulfate-K sulfate-Mg sulfate (Suprep Bowel Prep Kit) 17.5-3.13-1.6 GM/177ML Solution; Take one kit (two bottles in one package) following the instructions sent from your GI doctor's office.  Dispense: 354 mL; Refill: 0  - Celiac Disease Comprehensive Panel; Future  - Stool Cryptosporidium / Giardia Antigen; Future  - Stool Calprotectin Immunoassay; Future  - Clostridium difficile toxin B PCR; Future    2. 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 Antiplatelet or antithrombotic long-term use    4. Encounter for preprocedural laboratory examination  - COVID-19 (SARS-CoV-2); Future     Orders Placed This Encounter   Medications    Na sulfate-K sulfate-Mg sulfate (Suprep Bowel Prep Kit) 17.5-3.13-1.6 GM/177ML Solution     Sig: Take one kit (two bottles in one package) following the instructions sent from your GI doctor's office.     Dispense:  354 mL     Refill:  0      Medications Discontinued During This Encounter   Medication Reason    ondansetron (ZOFRAN-ODT) 4 MG disintegrating tablet      # Chronic diarrhea. Differential diagnosis for the etiology include C. difficile infection, and other infectious diarrhea, celiac disease, giardiasis, microscopic colitis, functional diarrhea, and rule out IBD and neoplasia.      Plan:  -C. difficile stool test for CDI  - Celiac serology for diarrhea  - Fecal Giardia Antigen for diarrhea  - Fecal calprotectin for IBD for diarrhea  - Colonoscopy with mucosal biopsy for evaluation of inflammatory/secretory diarrhea.  - Advised patient to drink plenty of water to avoid dehydration.  If stool tests rule out inflammatory/infectious diarrhea, may use Imodium 2 to 4 mg every 6 hours as needed for diarrhea.    Management of antithrombotics for endoscopy:  -Hold  rivaroxaban (Xarelto) for 3 days before and the procedure day colonoscopy given decreased EGFR of 47.5 in 09/2019.     Return if symptoms worsen or fail to improve.    It is a pleasure to participate in the care of [name]. If you have any questions or concerns, please feel free to contact us at [phone].    Einar Grad, MD

## 2019-12-24 NOTE — Patient Instructions (Addendum)
Patient Instructions:    Plan:  -C. difficile stool test for CDI  - Celiac serology for diarrhea  - Fecal Giardia Antigen for diarrhea  - Fecal calprotectin for IBD for diarrhea  - Colonoscopy with mucosal biopsy for evaluation of inflammatory/secretory diarrhea.  - Advised patient to drink plenty of water to avoid dehydration.  If stool tests rule out inflammatory/infectious diarrhea, may use Imodium 2 to 4 mg every 6 hours as needed for diarrhea.    Management of antithrombotics for endoscopy:  -Hold rivaroxaban (Xarelto) for 3 days before and the procedure day colonoscopy given decreased EGFR of 47.5 in 09/2019.     Diagnostic tests and referrals placed today:    - Colonoscopy (See instructions below)    Orders Placed This Encounter   Procedures    COVID-19 (SARS-CoV-2)     Within 72 hours before endoscopic procedures.     Standing Status:   Future     Standing Expiration Date:   01/24/2020     Order Specific Question:   Source     Answer:   Nasopharyngeal     Order Specific Question:   Purpose of Test:     Answer:   Screening     Order Specific Question:   Symptomatic for COVID-19 as defined by CDC?     Answer:   No     Order Specific Question:   Hospitalized for COVID-19?     Answer:   No     Order Specific Question:   Admitted to ICU for COVID-19?     Answer:   No     Order Specific Question:   Employed in healthcare setting with direct patient contact?     Answer:   Unknown     Order Specific Question:   Resident in a congregate (group) care setting?     Answer:   Unknown    Stool Cryptosporidium / Giardia Antigen     Standing Status:   Future     Standing Expiration Date:   12/23/2020    Clostridium difficile toxin B PCR     Standing Status:   Future     Standing Expiration Date:   01/23/2020    Celiac Disease Comprehensive Panel     Standing Status:   Future     Standing Expiration Date:   12/23/2020    Stool Calprotectin Immunoassay     Standing Status:   Future     Standing Expiration Date:   06/22/2020      Order Specific Question:   Specimen type/source/volume     Answer:   Stool/10 mL       Start taking the following new medications prescribed today:  Orders Placed This Encounter   Medications    Na sulfate-K sulfate-Mg sulfate (Suprep Bowel Prep Kit) 17.5-3.13-1.6 GM/177ML Solution     Sig: Take one kit (two bottles in one package) following the instructions sent from your GI doctor's office.     Dispense:  354 mL     Refill:  0        Stop taking the following medications:  Medications Discontinued During This Encounter   Medication Reason    ondansetron (ZOFRAN-ODT) 4 MG disintegrating tablet        ---------------    Colonoscopy Bowel Prep Instructions    Einar Grad, MD  [address]  [place] [address]    Patient Name: [name]    1. Please mix your prep solution (  PEG/Electrolyte-Generic, NuLYTELY, GoLYTELY, TriLyte and CoLyte) with one gallon of water. This should be done 24 hours before the scheduled procedure and the entire jug should be placed in the refrigerator so that is adequately chilled. Nothing must be added to the solution, other than flavor packet, but you may keep the jug in a pan of ice water while you are drinking your prep solution.    2. Diet: The day before your procedure, you may only have clear liquids for breakfast, lunch, and dinner ( Broth, Jello, clear sodas, clear apple juice, white grape juice, white cranberry juice, weak tea NO milk or lemon). You may continue to have clear liquids until 3 hours prior to your procedure. NO RED JELLO    3. The prep solution is a split-dose (2-day) regimen. The entire one gallon of prep solution is required for a complete prep.    3. First Half Gallon Dose: Start to drink the first half gallon of prep solution at 6 pm the evening before your procedure: Drink one 8oz glass every 10-15 minutes until the half gallon has been consumed. It is important that this rate be maintained if at all possible. You should expect  to begin moving your bowels within one hour after you begin drinking the  prep solution and those bowel movements will quickly become liquid and eventually clear.     4. Second Half Gallon Dose: On the day of your colonoscopy, you may have to wake up early. 6 HOURS before your colonoscopy, start to drink the second half gallon of prep solution, the same way you drank the first half gallon: drink one 8oz glass every 10-15 minutes until the half gallon has been consumed. If you drink according to schedule, you will finish drinking 4 hours before your colonoscopy.     Note: You must stop everything by mouth, including all liquids, smoking and chewing gum 3 hour before your colonoscopy.    5. If you are taking any type of iron tablets stop taking them until after your procedure.     Date: In 2 weeks.      TAKE THE FOLLOWING MEDICATIONS WITH A SIP OF WATER ON THE MORNING OF THE TEST:       You may take all of your morning medicines (except for oral diabetes medicine - pills) as usual with a sip of water up to 4 hours before your procedure.   If you take oral diabetes medicine (pills) for your diabetes: do not take the medicine the morning of your procedure.     - Hold rivaroxaban (Xarelto) for 3 days before and the procedure day colonoscopy given decreased EGFR of 47.5 in 09/2019.     *Important note: This examination carries with it the risk of perforation, a serious complication that could require surgery.       PLEASE NOTE:  YOU MUST HAVE SOMEONE DRIVE YOU HOME FROM YOUR PROCEDURE, OTHER THAN A TAXI OR YOUR PROCEDURE WILL BE CANCELLED!        []   [facility]    Please report 1 HOUR prior to your scheduled procedure.    [address]  [place], [place] [address]  [phone]      []  [facility]     If your procedure is to be done at [facility], please report to the registration desk at the yellow entrance 1 HOUR prior to your scheduled procedure.    [address] [place]  [address]  [place], [place]  [address]  [phone]      -------------------        Colonoscopy     A camera attached to a flexible tube with a viewing lens is used to take video pictures.   Colonoscopy is a test to view the inside of your lower digestive tract (colon and rectum). Sometimes it can show the last part of the small intestine (ileum). During the test, small pieces of tissue may be removed for testing. This is called a biopsy. Small growths, such as polyps, may also be removed.   Why is colonoscopy done?  The test is done to help look for colon cancer. And it can help find the source of abdominal pain, bleeding, and changes in bowel habits. It may be needed once a year to every 10 years, depending on factors such as your:   Age   Health history   Family health history   Symptoms   Results from any prior colonoscopy  Risks and possible complications  These include:   Bleeding                A puncture or tear in the colon    Risks of anesthesia   A cancer lesion not being seen or fully removed  Getting ready   To prepare for the test:   Talk with your healthcare provider about the risks of the test (see below). Also ask your healthcare provider about alternatives to the test.   Tell your healthcare provider about any medicines and supplements you take. Also tell him or her about any health conditions you may have.   Make sure your rectum and colon are empty for the test. Follow the diet and bowel prep instructions exactly. If you dont, the test may need to be rescheduled.   Plan for a friend or family member to drive you home after the test.     Colonoscopy provides an inside view of the entire colon.     You may discuss the results with your doctor right away or at a future visit.  During the test   The test is usually done in the hospital on an outpatient basis or at an outpatient clinic. This means you go home the same day. The procedure takes about 30 minutes. During that time:   You are  given relaxing (sedating) medicine through an IV line. You may be drowsy, or fully asleep.   The healthcare provider will first give you a physical exam to check for anal and rectal problems.   Then the anus is lubricated and the scope inserted.   If you are awake, you may have a feeling similar to needing to have a bowel movement. You may also feel pressure as air is pumped into the colon. Its OK to pass gas during the procedure.   Biopsy, polyp removal, or other treatments may be done during the test.  After the test   You may have gas right after the test. It can help to try to pass it to help prevent later bloating. Your healthcare provider may discuss the results with you right away. Or you may need to schedule a follow-up visit to talk about the results. After the test, you can go back to your normal eating and other activities. You may be tired from the sedation and need to rest for a few hours. Discuss your medicines with your provider to understand if they can be restarted right away.  [employer] last reviewed this educational  content on 05/24/2018  © 2000-2020 [employer] [address], [place] [address]. All rights reserved. This information is not intended as a substitute for professional medical care. Always follow your healthcare professional's instructions.

## 2020-01-01 ENCOUNTER — Other Ambulatory Visit (FREE_STANDING_LABORATORY_FACILITY): Payer: BC Managed Care – PPO

## 2020-01-01 DIAGNOSIS — R197 Diarrhea, unspecified: Secondary | ICD-10-CM

## 2020-01-05 LAB — CELIAC DISEASE COMP PANEL: IGA for Celiac: 245 mg/dL (ref 61–356)

## 2020-01-05 LAB — TISSUE TRANSGLUTAMINASE, IGA: Tissue Transglutaminase Antibody, IgA: <1.2

## 2020-01-13 ENCOUNTER — Encounter (INDEPENDENT_AMBULATORY_CARE_PROVIDER_SITE_OTHER): Payer: Self-pay

## 2020-01-13 ENCOUNTER — Telehealth (INDEPENDENT_AMBULATORY_CARE_PROVIDER_SITE_OTHER): Payer: Self-pay

## 2020-01-13 NOTE — Telephone Encounter (Signed)
Spoke with pt. Pt is sched for colo on 2/11 at 9:30 am, arriving at 8:30 am at [facility]. Pt was given covid timing and information. Pt picked up their prep and has access to instructions. Went over prep instructions in detail. Will send pt mychart message with all details.

## 2020-01-15 ENCOUNTER — Encounter (INDEPENDENT_AMBULATORY_CARE_PROVIDER_SITE_OTHER): Payer: Self-pay | Admitting: Internal Medicine

## 2020-01-17 DIAGNOSIS — R197 Diarrhea, unspecified: Secondary | ICD-10-CM

## 2020-01-18 ENCOUNTER — Other Ambulatory Visit (FREE_STANDING_LABORATORY_FACILITY): Payer: BC Managed Care – PPO

## 2020-01-18 DIAGNOSIS — R197 Diarrhea, unspecified: Secondary | ICD-10-CM

## 2020-01-18 LAB — CLOSTRIDIUM DIFFICILE TOXIN B PCR: Stool Clostridium difficile Toxin B PCR: NEGATIVE

## 2020-01-20 LAB — STOOL CALPROTECTIN IMMUNOASSAY: Stool Calprotectin: <15.6

## 2020-01-27 ENCOUNTER — Other Ambulatory Visit: Payer: Self-pay | Admitting: Internal Medicine

## 2020-01-31 ENCOUNTER — Ambulatory Visit (INDEPENDENT_AMBULATORY_CARE_PROVIDER_SITE_OTHER): Payer: BC Managed Care – PPO

## 2020-01-31 DIAGNOSIS — Z1159 Encounter for screening for other viral diseases: Secondary | ICD-10-CM

## 2020-01-31 DIAGNOSIS — Z20822 Contact with and (suspected) exposure to covid-19: Secondary | ICD-10-CM

## 2020-01-31 DIAGNOSIS — Z01818 Encounter for other preprocedural examination: Secondary | ICD-10-CM

## 2020-02-01 ENCOUNTER — Other Ambulatory Visit: Payer: Self-pay | Admitting: Internal Medicine

## 2020-02-01 LAB — COVID-19 (SARS-COV-2): SARS CoV 2 Overall Result: NOT DETECTED

## 2020-02-03 ENCOUNTER — Other Ambulatory Visit: Payer: Self-pay | Admitting: Internal Medicine

## 2020-02-03 MED ORDER — TADALAFIL 20 MG PO TABS
10.0000 mg | ORAL_TABLET | Freq: Every day | ORAL | 3 refills | Status: DC | PRN
Start: 2020-02-03 — End: 2020-02-22

## 2020-02-04 ENCOUNTER — Ambulatory Visit: Payer: Self-pay

## 2020-02-04 ENCOUNTER — Encounter (INDEPENDENT_AMBULATORY_CARE_PROVIDER_SITE_OTHER): Payer: Self-pay | Admitting: Internal Medicine

## 2020-02-04 ENCOUNTER — Encounter (FREE_STANDING_LABORATORY_FACILITY): Payer: BC Managed Care – PPO

## 2020-02-04 ENCOUNTER — Ambulatory Visit (AMBULATORY_SURGERY_CENTER): Payer: BC Managed Care – PPO | Admitting: Internal Medicine

## 2020-02-04 DIAGNOSIS — K635 Polyp of colon: Secondary | ICD-10-CM

## 2020-02-04 DIAGNOSIS — R197 Diarrhea, unspecified: Secondary | ICD-10-CM

## 2020-02-04 DIAGNOSIS — K5730 Diverticulosis of large intestine without perforation or abscess without bleeding: Secondary | ICD-10-CM

## 2020-02-04 DIAGNOSIS — K621 Rectal polyp: Secondary | ICD-10-CM

## 2020-02-04 LAB — SURGICAL PATHOLOGY EXAM

## 2020-02-04 MED ORDER — LOPERAMIDE HCL 2 MG PO TABS
2.0000 mg | ORAL_TABLET | Freq: Four times a day (QID) | ORAL | 3 refills | Status: DC | PRN
Start: 2020-02-04 — End: 2022-03-07

## 2020-02-04 MED ORDER — METAMUCIL 28 % PO PACK
1.00 | PACK | Freq: Two times a day (BID) | ORAL | 3 refills | Status: AC
Start: 2020-02-04 — End: 2020-06-03

## 2020-02-09 NOTE — Progress Notes (Signed)
Dear [name],     I have reviewed the pathology report of the polyp(s) removed during your recent endoscopic procedure. The colon polyp was benign and non-precancerous. I advise that you have your next screening colonoscopy in 10 years.    Please notify the office if you have any questions regarding the pathology report.    Einar Grad, MD  [facility]

## 2020-02-15 ENCOUNTER — Other Ambulatory Visit: Payer: Self-pay | Admitting: Internal Medicine

## 2020-02-15 NOTE — Telephone Encounter (Signed)
He is scheduled for 03/04/2020

## 2020-02-15 NOTE — Telephone Encounter (Signed)
Time for appointment

## 2020-02-16 ENCOUNTER — Other Ambulatory Visit: Payer: Self-pay | Admitting: Internal Medicine

## 2020-02-16 NOTE — Telephone Encounter (Signed)
He is scheduled for 03/04/20

## 2020-02-16 NOTE — Telephone Encounter (Signed)
Time for appointment

## 2020-02-18 ENCOUNTER — Other Ambulatory Visit: Payer: Self-pay | Admitting: Internal Medicine

## 2020-02-18 NOTE — Telephone Encounter (Signed)
He needs appointment.

## 2020-02-18 NOTE — Telephone Encounter (Signed)
He is scheduled for 03/04/2020

## 2020-02-22 ENCOUNTER — Other Ambulatory Visit: Payer: Self-pay | Admitting: Internal Medicine

## 2020-02-22 MED ORDER — TADALAFIL 20 MG PO TABS
10.0000 mg | ORAL_TABLET | Freq: Every day | ORAL | 3 refills | Status: DC | PRN
Start: 2020-02-22 — End: 2020-04-06

## 2020-03-04 ENCOUNTER — Ambulatory Visit: Payer: BC Managed Care – PPO | Admitting: Internal Medicine

## 2020-03-04 ENCOUNTER — Encounter: Payer: Self-pay | Admitting: Internal Medicine

## 2020-03-04 VITALS — BP 149/90 | HR 60 | Ht 70.0 in | Wt 206.3 lb

## 2020-03-04 DIAGNOSIS — E782 Mixed hyperlipidemia: Secondary | ICD-10-CM

## 2020-03-04 DIAGNOSIS — K635 Polyp of colon: Secondary | ICD-10-CM

## 2020-03-04 DIAGNOSIS — I10 Essential (primary) hypertension: Secondary | ICD-10-CM

## 2020-03-04 MED ORDER — FENOFIBRATE 145 MG PO TABS
145.00 mg | ORAL_TABLET | Freq: Every day | ORAL | 1 refills | Status: AC
Start: 2020-03-04 — End: 2020-08-31

## 2020-03-04 MED ORDER — VALSARTAN-HYDROCHLOROTHIAZIDE 320-25 MG PO TABS
1.0000 | ORAL_TABLET | Freq: Every day | ORAL | 1 refills | Status: DC
Start: 2020-03-04 — End: 2020-10-07

## 2020-03-04 NOTE — Progress Notes (Signed)
Date Time: 03/04/2020 10:01 AM  Patient Name: [name], [name]   DOB: [birth year]    Subjective:   [name] is a 64 y.o. male who presents for a follow-up visit    In December 2020 he had a tooth problem and extraction and he was given antibiotics for about a week  Since then he has been having daily 4-5 times diarrhea without blood or mucus but he has some tenesmus he said  I gave him a course of Flagyl this did not help he went to gastroenterology and he had a colonoscopy they found 1 polyp which was not malignant but told him to come back in 10 years  History has intermittent diarrhea and they gave him loperamide    His blood work shows that his cholesterol is high and his blood pressure is still high as well    Past Medical History:     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    x2 last 2018    H/O lumbar discectomy 08/12/2018    2011     H/O: vasectomy 08/12/2018    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    Hyperplastic colonic polyp 08/12/2018    2017 return in 5 years    Hypertension     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    Mixed hyperlipidemia 08/12/2018    Nephrolithiasis 08/12/2018    x2 last 2018     Premature ejaculation 08/12/2018    Preop examination 09/29/2019    Throat fullness 08/28/2019       Past Surgical History:     Past Surgical History:   Procedure Laterality Date    BUNIONECTOMY  02/09/2015    COLONOSCOPY  07/2007    VASECTOMY  05/1992       Family History:     Family History   Problem Relation Age of Onset    Stroke Mother        Social History:     Social History     Socioeconomic History    Marital status: Divorced     Spouse name: None    Number of children: None    Years of education: None    Highest education level: None   Occupational History    None   Social Engineer, site  strain: None    Food insecurity     Worry: None     Inability: None    Transportation needs     Medical: None     Non-medical: None   Tobacco Use    Smoking status: Never Smoker    Smokeless tobacco: Never Used   Substance and Sexual Activity    Alcohol use: Yes    Drug use: No    Sexual activity: None   Lifestyle    Physical activity     Days per week: None     Minutes per session: None    Stress: None   Relationships    Social connections     Talks on phone: None     Gets together: None     Attends religious service: None     Active member of club or organization: None     Attends meetings of clubs or organizations: None     Relationship status: None    Intimate  partner violence     Fear of current or ex partner: None     Emotionally abused: None     Physically abused: None     Forced sexual activity: None   Other Topics Concern    None   Social History Narrative    None       Allergies:   No Known Allergies    Medications:     Current Outpatient Medications   Medication Sig Dispense Refill    fenofibrate (TRICOR) 145 MG tablet Take 1 tablet (145 mg total) by mouth daily 90 tablet 1    loperamide (IMODIUM A-D) 2 MG tablet Take 1 tablet (2 mg total) by mouth 4 (four) times daily as needed for Diarrhea 40 tablet 3    Multiple Vitamins-Minerals (MULTIVITAMIN WITH MINERALS) tablet Take 1 tablet by mouth daily      Na sulfate-K sulfate-Mg sulfate (Suprep Bowel Prep Kit) 17.5-3.13-1.6 GM/177ML Solution Take one kit (two bottles in one package) following the instructions sent from your GI doctor's office. 354 mL 0    psyllium (Metamucil) 28 % packet Take 1 packet by mouth 2 (two) times daily 60 packet 3    rivaroxaban (Xarelto) 20 MG Tab Take 1 tablet (20 mg total) by mouth daily with dinner 90 tablet 1    rosuvastatin (CRESTOR) 40 MG tablet TAKE 1 TABLET BY MOUTH EVERY DAY 90 tablet 0    sertraline (ZOLOFT) 50 MG tablet Take 1 tablet (50 mg total) by mouth daily 90 tablet 1    tadalafil (CIALIS) 20  MG tablet Take 0.5 tablets (10 mg total) by mouth daily as needed for Erectile Dysfunction 30 tablet 3    valacyclovir (VALTREX) 1000 MG tablet Take 1 tablet (1,000 mg total) by mouth 2 (two) times daily 20 tablet 3    valsartan-hydroCHLOROthiazide (DIOVAN-HCT) 320-25 MG per tablet Take 1 tablet by mouth daily 90 tablet 1    vitamin D (CHOLECALCIFEROL) 1000 units tablet Take 5,000 Units by mouth daily       No current facility-administered medications for this visit.        Review of Systems:   A comprehensive review of systems was:   General ROS:   Respiratory ROS: no cough, shortness of breath, or wheezing  Cardiovascular ROS: no chest pain or dyspnea on exertion  Gastrointestinal ROS: occasional intermittent diarrhea  Genito-Urinary ROS: no dysuria, trouble voiding, or hematuria. Recurrent herpes simplex attacks  Musculoskeletal ROS: no joint pains or swelling  Neurological ROS: no TIA or stroke symptoms  Dermatologic ROS: negative for rash    Physical Exam:     Vitals:    03/04/20 0932   BP: 149/90   Pulse: 60     BP Readings from Last 3 Encounters:   03/04/20 149/90   10/21/19 137/87   09/29/19 (!) 145/91     Wt Readings from Last 3 Encounters:   03/04/20 93.6 kg (206 lb 4.8 oz)   12/24/19 91.6 kg (202 lb)   11/27/19 89.8 kg (198 lb)   Body mass index is 29.6 kg/m.    General appearance - alert, well appearing, and in no distress  Chest - clear to auscultation, no wheezes, rales or rhonchi, symmetric air entry  Heart - normal rate, regular rhythm, normal S1, S2, no murmurs, rubs, clicks or gallops  Abdomen - soft, nontender, nondistended, no masses or organomegaly  Neurological - alert, oriented, normal speech, no focal findings or movement disorder noted  Extremities - peripheral pulses normal,  no clubbing or cyanosis   trace edema and vertical dermatitis around the right ankle    Labs:     Recent Results (from the past 2016 hour(s))   Celiac Disease Comprehensive Panel    Collection Time: 01/01/20  1:43  PM   Result Value Ref Range    IGA for Celiac 245 61 - 356 mg/dL    Celiac Disease Interp SEE BELOW    Tissue transglutaminase, IgA    Collection Time: 01/01/20  1:43 PM   Result Value Ref Range    Tissue Transglutaminase AB, IgA <1.2    Stool Calprotectin Immunoassay    Collection Time: 01/17/20  7:45 PM   Result Value Ref Range    STOOL CALPROTECTIN <15.6    Clostridium difficile toxin B PCR    Collection Time: 01/17/20  7:45 PM    Specimen: Stool   Result Value Ref Range    Stool Clostridium difficile Toxin B PCR Negative    COVID-19  Pre-Surgical & Pre-Procedure    Collection Time: 01/31/20  3:12 PM    Specimen: Nasal Swab; Nasopharyngeal Swab   Result Value Ref Range    Purpose of COVID testing Screening     SARS-CoV-2 Specimen Source Nasal Swab     SARS CoV 2 Overall Result Not Detected    Surgical pathology exam    Collection Time: 02/04/20  9:49 AM   Result Value Ref Range    Surgical Pathology See Path Report        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      1. 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 Hyperlipidemia Not well controlled he is on Crestor 40 ll add TriCor 145 comeback in 3 months for recheck    3.  Recurrent genital herpes about once a year and he gets Valtrex for 5 days new prescription was given    4.  Hypertension not well-controlled because of poor compliance I admonished his behavior  Increase Diovan to  320/25 and bring blood pressure readings next visit    5.  History of premature ejaculation and he uses Zoloft for that, And Viagra for erectile dysfunction    6.  History of colonic polyps last colonoscopy was in 2017 and repeat colonoscopy in 01/2020 if on only 1 polyp nonmalignant told him to come back in 10 years    7.  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  History of kidney stones twice that he can remember drinking plenty of fluids    9. Erectile dysfunction and he Wanted an increase in the dose of Cialis therefore we gave him 20 mg    10.  Sensation of fullness in the throat status post steroid treatment  by ENT however no improvement he was supposed to get a panendoscopy by Dr. Harrold Donath we did not discuss this today    11.  Diarrhea 4-5 times after antibiotic treatment for tooth extraction Flagyl that I gave him did not work he saw gastroenterology they did a colonoscopy that only found 1 polyp him back in 10 years  And they gave him some loperamide he has occasional diarrhea nowadays  RTC in 3 months to do blood work      This note was generated by the Epic EMR system/Speech recognition and may contain inherent errors or omissions not intended by the user. Grammatical errors, random word insertions, deletions and pronoun errors are occasional consequences of this technology due to software limitations.   Not all errors are caught or corrected. If there are questions or concerns about the content of this note or information contained within the body of this dictation they should be addressed directly with the author for clarification.      Signed by: Nyra Jabs

## 2020-04-06 ENCOUNTER — Other Ambulatory Visit: Payer: Self-pay | Admitting: Internal Medicine

## 2020-04-06 MED ORDER — TADALAFIL 20 MG PO TABS
10.0000 mg | ORAL_TABLET | Freq: Every day | ORAL | 3 refills | Status: DC | PRN
Start: 2020-04-06 — End: 2020-04-08

## 2020-04-08 ENCOUNTER — Other Ambulatory Visit: Payer: Self-pay | Admitting: Internal Medicine

## 2020-04-08 MED ORDER — TADALAFIL 20 MG PO TABS
20.0000 mg | ORAL_TABLET | Freq: Every day | ORAL | 3 refills | Status: DC | PRN
Start: 2020-04-08 — End: 2021-01-31

## 2020-04-08 MED ORDER — TADALAFIL 20 MG PO TABS
20.0000 mg | ORAL_TABLET | Freq: Every day | ORAL | 3 refills | Status: DC | PRN
Start: 2020-04-08 — End: 2020-04-08

## 2020-06-03 ENCOUNTER — Encounter: Payer: Self-pay | Admitting: Internal Medicine

## 2020-06-03 ENCOUNTER — Ambulatory Visit: Payer: BC Managed Care – PPO | Admitting: Internal Medicine

## 2020-06-03 VITALS — BP 133/85 | HR 68 | Temp 97.8°F | Wt 212.0 lb

## 2020-06-03 DIAGNOSIS — K591 Functional diarrhea: Secondary | ICD-10-CM

## 2020-06-03 DIAGNOSIS — E782 Mixed hyperlipidemia: Secondary | ICD-10-CM

## 2020-06-03 DIAGNOSIS — I10 Essential (primary) hypertension: Secondary | ICD-10-CM

## 2020-06-03 DIAGNOSIS — I82411 Acute embolism and thrombosis of right femoral vein: Secondary | ICD-10-CM

## 2020-06-03 HISTORY — DX: Functional diarrhea: K59.1

## 2020-06-03 NOTE — Progress Notes (Signed)
Date Time: 06/03/2020 10:31 AM  Patient Name: [name], [name]   DOB: 29-Jul-1956    Subjective:   [name] is a 64 y.o. male who presents for a follow-up visit    In December 2020 he had a tooth problem and extraction and he was given antibiotics for about a week  Since then he has been having daily 4-5 times diarrhea without blood or mucus but he has some tenesmus he said  I gave him a course of Flagyl this did not help he went to gastroenterology and he had a colonoscopy they found 1 polyp which was not malignant but told him to come back in 10 years  History has intermittent diarrhea and they gave him loperamide    His blood work shows that his cholesterol is high and his blood pressure is still high as well    06/03/2020  He feels very good in himself he has no breathing troubles or chest pains no bowel or urinary problems his medications were reviewed with him he has tolerated the higher dose of the valsartan and its time to check his kidney function    Past Medical History:     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    x2 last 2018    Functional diarrhea 06/03/2020    H/O lumbar discectomy 08/12/2018    2011     H/O: vasectomy 08/12/2018    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    Hyperplastic colonic polyp 08/12/2018    2017 return in 5 years    Hypertension     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    Mixed hyperlipidemia 08/12/2018    Nephrolithiasis 08/12/2018    x2 last 2018     Premature ejaculation 08/12/2018    Preop examination 09/29/2019    Throat fullness 08/28/2019       Past Surgical History:     Past Surgical History:   Procedure Laterality Date    BUNIONECTOMY  02/09/2015    COLONOSCOPY  07/2007    VASECTOMY  05/1992       Family History:     Family History   Problem Relation Age of Onset    Stroke Mother         Social History:     Social History     Socioeconomic History    Marital status: Divorced     Spouse name: None    Number of children: None    Years of education: None    Highest education level: None   Occupational History    None   Tobacco Use    Smoking status: Never Smoker    Smokeless tobacco: Never Used   Haematologist Use: Never used   Substance and Sexual Activity    Alcohol use: Yes    Drug use: No    Sexual activity: None   Other Topics Concern    None   Social History Narrative    None     Social Determinants of Health     Financial Resource Strain:     Difficulty of Paying Living Expenses:    Food Insecurity:     Worried About Radiation protection practitioner of Food in the Last Year:     Ran Out of Food in the Last Year:  Transportation Needs:     Freight forwarder (Medical):     Lack of Transportation (Non-Medical):    Physical Activity:     Days of Exercise per Week:     Minutes of Exercise per Session:    Stress:     Feeling of Stress :    Social Connections:     Frequency of Communication with Friends and Family:     Frequency of Social Gatherings with Friends and Family:     Attends Religious Services:     Active Member of Clubs or Organizations:     Attends Banker Meetings:     Marital Status:    Intimate Partner Violence:     Fear of Current or Ex-Partner:     Emotionally Abused:     Physically Abused:     Sexually Abused:        Allergies:   No Known Allergies    Medications:     Current Outpatient Medications   Medication Sig Dispense Refill    fenofibrate (TRICOR) 145 MG tablet Take 1 tablet (145 mg total) by mouth daily 90 tablet 1    loperamide (IMODIUM A-D) 2 MG tablet Take 1 tablet (2 mg total) by mouth 4 (four) times daily as needed for Diarrhea 40 tablet 3    Multiple Vitamins-Minerals (MULTIVITAMIN WITH MINERALS) tablet Take 1 tablet by mouth daily      Na sulfate-K sulfate-Mg sulfate (Suprep Bowel Prep Kit) 17.5-3.13-1.6 GM/177ML Solution  Take one kit (two bottles in one package) following the instructions sent from your GI doctor's office. 354 mL 0    psyllium (Metamucil) 28 % packet Take 1 packet by mouth 2 (two) times daily 60 packet 3    rivaroxaban (Xarelto) 20 MG Tab Take 1 tablet (20 mg total) by mouth daily with dinner 90 tablet 1    rosuvastatin (CRESTOR) 40 MG tablet TAKE 1 TABLET BY MOUTH EVERY DAY 90 tablet 0    sertraline (ZOLOFT) 50 MG tablet Take 1 tablet (50 mg total) by mouth daily 90 tablet 1    tadalafil (CIALIS) 20 MG tablet Take 1 tablet (20 mg total) by mouth daily as needed for Erectile Dysfunction As needed 18 tablet 3    valacyclovir (VALTREX) 1000 MG tablet Take 1 tablet (1,000 mg total) by mouth 2 (two) times daily 20 tablet 3    valsartan-hydroCHLOROthiazide (DIOVAN-HCT) 320-25 MG per tablet Take 1 tablet by mouth daily 90 tablet 1    vitamin D (CHOLECALCIFEROL) 1000 units tablet Take 5,000 Units by mouth daily       No current facility-administered medications for this visit.       Review of Systems:   A comprehensive review of systems was:   General ROS:   Respiratory ROS: no cough, shortness of breath, or wheezing  Cardiovascular ROS: no chest pain or dyspnea on exertion  Gastrointestinal ROS: occasional intermittent diarrhea  Genito-Urinary ROS: no dysuria, trouble voiding, or hematuria. Recurrent herpes simplex attacks  Musculoskeletal ROS: no joint pains or swelling  Neurological ROS: no TIA or stroke symptoms  Dermatologic ROS: negative for rash    Physical Exam:     Vitals:    06/03/20 0915   BP: 133/85   Pulse: 68   Temp: 97.8 F (36.6 C)   SpO2: 98%     BP Readings from Last 3 Encounters:   06/03/20 133/85   03/04/20 149/90   10/21/19 137/87     Wt Readings from Last 3  Encounters:   06/03/20 96.2 kg (212 lb)   03/04/20 93.6 kg (206 lb 4.8 oz)   12/24/19 91.6 kg (202 lb)   Body mass index is 30.42 kg/m.    General appearance - alert, well appearing, and in no distress  Chest - clear to auscultation, no  wheezes, rales or rhonchi, symmetric air entry  Heart - normal rate, regular rhythm, normal S1, S2, no murmurs, rubs, clicks or gallops  Abdomen - soft, nontender, nondistended, no masses or organomegaly  Neurological - alert, oriented, normal speech, no focal findings or movement disorder noted  Extremities - peripheral pulses normal,  no clubbing or cyanosis   trace edema and vertical dermatitis around the right ankle    Labs:     No results found for this or any previous visit (from the past 2016 hour(s)).    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     Answer:   Yes     Order Specific Question:   Release to patient     Answer:   Immediate       1. 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 Hyperlipidemia Not well controlled he is on Crestor 40 ll add TriCor 145 comeback in 3 months for recheck    3.  Recurrent genital herpes about once a year and he gets Valtrex for 5 days new prescription was given    4.  Hypertension not well-controlled because of poor compliance I admonished his behavior  Valsartan was increased last time to 320/25  To check his renal function he will give me a call    5.  History of premature ejaculation and he uses Zoloft for that, And Viagra for erectile dysfunction    6.  History of colonic polyps last colonoscopy was in 2017  and repeat colonoscopy in 01/2020 if on only 1 polyp nonmalignant told him to come back in 10 years    7.  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  History of kidney stones twice that he can remember drinking plenty of fluids    9. Erectile dysfunction and he Wanted an increase in the dose of Cialis therefore we gave him 20 mg    10.  Sensation of fullness in the throat status post steroid treatment by ENT however no improvement he was supposed to get a panendoscopy by Dr. Harrold Donath we did not discuss this today    11.  Diarrhea 4-5 times after antibiotic treatment for tooth extraction Flagyl that I gave him did not work he saw gastroenterology they did a colonoscopy that only found 1 polyp him  back in 10 years  However he tells me he gets watery diarrhea still intermittently I therefore advised him to go back and talk to the gastroenterologist  He received his COVID-19 immunization  RTC in 3 months       This note was generated by the Epic EMR system/Speech recognition and may contain inherent errors or omissions not intended by the user. Grammatical errors, random word insertions, deletions and pronoun errors are occasional consequences of this technology due to software limitations.   Not all errors are caught or corrected. If there are questions or concerns about the content of this note or information contained within the body of this dictation they should be addressed directly with the author for clarification.      Signed by: Nyra Jabs

## 2020-06-04 LAB — BASIC METABOLIC PANEL
African American eGFR: 57 mL/min/{1.73_m2} — ABNORMAL LOW (ref >59)
BUN / Creatinine Ratio: 15 (ref 10–24)
BUN: 22 mg/dL (ref 8–27)
CO2: 23 mmol/L (ref 20–29)
Calcium: 9.8 mg/dL (ref 8.6–10.2)
Chloride: 104 mmol/L (ref 96–106)
Creatinine: 1.5 mg/dL — ABNORMAL HIGH (ref 0.76–1.27)
Glucose: 86 mg/dL (ref 65–99)
Potassium: 4.2 mmol/L (ref 3.5–5.2)
Sodium: 141 mmol/L (ref 134–144)
non-African American eGFR: 49 mL/min/{1.73_m2} — ABNORMAL LOW (ref >59)

## 2020-08-06 ENCOUNTER — Other Ambulatory Visit (INDEPENDENT_AMBULATORY_CARE_PROVIDER_SITE_OTHER): Payer: Self-pay | Admitting: Internal Medicine

## 2020-08-06 DIAGNOSIS — R197 Diarrhea, unspecified: Secondary | ICD-10-CM

## 2020-09-07 ENCOUNTER — Ambulatory Visit: Payer: BC Managed Care – PPO | Admitting: Internal Medicine

## 2020-09-09 ENCOUNTER — Encounter: Payer: Self-pay | Admitting: Internal Medicine

## 2020-09-09 ENCOUNTER — Ambulatory Visit: Payer: BC Managed Care – PPO | Admitting: Internal Medicine

## 2020-09-09 VITALS — BP 135/89 | HR 69 | Temp 96.2°F | Ht 70.0 in | Wt 211.0 lb

## 2020-09-09 DIAGNOSIS — F524 Premature ejaculation: Secondary | ICD-10-CM

## 2020-09-09 DIAGNOSIS — F3342 Major depressive disorder, recurrent, in full remission: Secondary | ICD-10-CM

## 2020-09-09 DIAGNOSIS — I10 Essential (primary) hypertension: Secondary | ICD-10-CM

## 2020-09-09 DIAGNOSIS — F330 Major depressive disorder, recurrent, mild: Secondary | ICD-10-CM

## 2020-09-09 DIAGNOSIS — F339 Major depressive disorder, recurrent, unspecified: Secondary | ICD-10-CM | POA: Insufficient documentation

## 2020-09-09 DIAGNOSIS — N1831 Chronic kidney disease, stage 3a: Secondary | ICD-10-CM

## 2020-09-09 DIAGNOSIS — E782 Mixed hyperlipidemia: Secondary | ICD-10-CM

## 2020-09-09 DIAGNOSIS — D126 Benign neoplasm of colon, unspecified: Secondary | ICD-10-CM

## 2020-09-09 DIAGNOSIS — F331 Major depressive disorder, recurrent, moderate: Secondary | ICD-10-CM

## 2020-09-09 HISTORY — DX: Chronic kidney disease, stage 3a: N18.31

## 2020-09-09 MED ORDER — FENOFIBRATE 145 MG PO TABS
145.0000 mg | ORAL_TABLET | Freq: Every day | ORAL | 1 refills | Status: DC
Start: 2020-09-09 — End: 2021-06-28

## 2020-09-09 MED ORDER — SERTRALINE HCL 50 MG PO TABS
50.0000 mg | ORAL_TABLET | Freq: Every day | ORAL | 1 refills | Status: DC
Start: 2020-09-09 — End: 2020-11-07

## 2020-09-09 MED ORDER — RIVAROXABAN 20 MG PO TABS
20.0000 mg | ORAL_TABLET | Freq: Every day | ORAL | 1 refills | Status: DC
Start: 2020-09-09 — End: 2021-02-15

## 2020-09-09 NOTE — Progress Notes (Signed)
Date Time: 09/09/2020 10:08 AM  Patient Name: [name], [name]   DOB: Oct 07, 1956    Subjective:   [name] is a 64 y.o. male who presents for a follow-up visit    In December 2020 he had a tooth problem and extraction and he was given antibiotics for about a week  Since then he has been having daily 4-5 times diarrhea without blood or mucus but he has some tenesmus he said  I gave him a course of Flagyl this did not help he went to gastroenterology and he had a colonoscopy they found 1 polyp which was not malignant but told him to come back in 10 years  History has intermittent diarrhea and they gave him loperamide    His blood work shows that his cholesterol is high and his blood pressure is still high as well    06/03/2020  He feels very good in himself he has no breathing troubles or chest pains no bowel or urinary problems his medications were reviewed with him he has tolerated the higher dose of the valsartan and its time to check his kidney function    09/09/2020  The gastroenterologist gave him loperamide for his intermittent loose stools and he gave him Metamucil which she has not taken  When he drives the toes in his right foot become a little bit numb and sometimes painful he has to shake his foot that everything is okay he has had DVT in that leg and he was worried  He has been taking his fenofibrate along with rosuvastatin and its time to recheck  He has not been taking Zoloft for his premature ejaculation on a routine basis only as needed which I told him of the wrong way    Past Medical History:     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    x2 last 2018    Functional diarrhea 06/03/2020    H/O lumbar discectomy 08/12/2018    2011     H/O: vasectomy 08/12/2018    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    Hyperplastic colonic polyp  08/12/2018    2017 return in 5 years    Hypertension     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    Mixed hyperlipidemia 08/12/2018    Nephrolithiasis 08/12/2018    x2 last 2018     Premature ejaculation 08/12/2018    Preop examination 09/29/2019    Stage 3a chronic kidney disease 09/09/2020    Throat fullness 08/28/2019       Past Surgical History:     Past Surgical History:   Procedure Laterality Date    BUNIONECTOMY  02/09/2015    COLONOSCOPY  07/2007    VASECTOMY  05/1992       Family History:     Family History   Problem Relation Age of Onset    Stroke Mother        Social History:     Social History     Socioeconomic History    Marital status: Divorced     Spouse name: None    Number of children: None    Years of education: None    Highest education level: None   Occupational History    None   Tobacco Use    Smoking status: Never Smoker  Smokeless tobacco: Never Used   Vaping Use    Vaping Use: Never used   Substance and Sexual Activity    Alcohol use: Yes    Drug use: No    Sexual activity: None   Other Topics Concern    None   Social History Narrative    None     Social Determinants of Health     Financial Resource Strain:     Difficulty of Paying Living Expenses:    Food Insecurity:     Worried About Programme researcher, broadcasting/film/video in the Last Year:     Barista in the Last Year:    Transportation Needs:     Freight forwarder (Medical):     Lack of Transportation (Non-Medical):    Physical Activity:     Days of Exercise per Week:     Minutes of Exercise per Session:    Stress:     Feeling of Stress :    Social Connections:     Frequency of Communication with Friends and Family:     Frequency of Social Gatherings with Friends and Family:     Attends Religious Services:     Active Member of Clubs or Organizations:     Attends Banker Meetings:     Marital Status:    Intimate Partner Violence:     Fear of Current or Ex-Partner:     Emotionally Abused:      Physically Abused:     Sexually Abused:        Allergies:   No Known Allergies    Medications:     Current Outpatient Medications   Medication Sig Dispense Refill    fenofibrate (TRICOR) 145 MG tablet Take 1 tablet (145 mg total) by mouth daily 90 tablet 1    loperamide (IMODIUM A-D) 2 MG tablet Take 1 tablet (2 mg total) by mouth 4 (four) times daily as needed for Diarrhea 40 tablet 3    Multiple Vitamins-Minerals (MULTIVITAMIN WITH MINERALS) tablet Take 1 tablet by mouth daily      Na sulfate-K sulfate-Mg sulfate (Suprep Bowel Prep Kit) 17.5-3.13-1.6 GM/177ML Solution Take one kit (two bottles in one package) following the instructions sent from your GI doctor's office. 354 mL 0    rivaroxaban (Xarelto) 20 MG Tab Take 1 tablet (20 mg total) by mouth daily with dinner 90 tablet 1    rosuvastatin (CRESTOR) 40 MG tablet TAKE 1 TABLET BY MOUTH EVERY DAY 90 tablet 0    sertraline (ZOLOFT) 50 MG tablet Take 1 tablet (50 mg total) by mouth daily 90 tablet 1    tadalafil (CIALIS) 20 MG tablet Take 1 tablet (20 mg total) by mouth daily as needed for Erectile Dysfunction As needed 18 tablet 3    valacyclovir (VALTREX) 1000 MG tablet Take 1 tablet (1,000 mg total) by mouth 2 (two) times daily 20 tablet 3    valsartan-hydroCHLOROthiazide (DIOVAN-HCT) 320-25 MG per tablet Take 1 tablet by mouth daily 90 tablet 1    vitamin D (CHOLECALCIFEROL) 1000 units tablet Take 5,000 Units by mouth daily       No current facility-administered medications for this visit.       Review of Systems:   A comprehensive review of systems was:   General ROS:   Respiratory ROS: no cough, shortness of breath, or wheezing  Cardiovascular ROS: no chest pain or dyspnea on exertion  Gastrointestinal ROS: occasional intermittent diarrhea  Genito-Urinary ROS: no dysuria,  trouble voiding, or hematuria. Recurrent herpes simplex attacks  Musculoskeletal ROS: no joint pains or swelling  Neurological ROS: Intermittent numbness of the right toes  while driving  Dermatologic ROS: negative for rash    Physical Exam:     Vitals:    09/09/20 0935   BP: 135/89   Pulse: 69   Temp: (!) 96.2 F (35.7 C)   SpO2: 100%     BP Readings from Last 3 Encounters:   09/09/20 135/89   06/03/20 133/85   03/04/20 149/90     Wt Readings from Last 3 Encounters:   09/09/20 95.7 kg (211 lb)   06/03/20 96.2 kg (212 lb)   03/04/20 93.6 kg (206 lb 4.8 oz)   Body mass index is 30.28 kg/m.    General appearance - alert, well appearing, and in no distress  Chest - clear to auscultation, no wheezes, rales or rhonchi, symmetric air entry  Heart - normal rate, regular rhythm, normal S1, S2, no murmurs, rubs, clicks or gallops  Abdomen - soft, nontender, nondistended, no masses or organomegaly  Neurological - alert, oriented, normal speech, no focal findings or movement disorder noted  Extremities - peripheral pulses normal,  no clubbing or cyanosis   trace edema and vertical dermatitis around the right ankle    Labs:     No results found for this or any previous visit (from the past 2016 hour(s)).    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    Hypertension    H/O lumbar discectomy    Nephrolithiasis    Mixed hyperlipidemia    Premature ejaculation    Herpes simplex infection of penis    LAFB (left anterior fascicular block)    Throat fullness    COVID-19 virus infection    Adenomatous polyp of colon    Preop examination    Functional diarrhea    Major depressive disorder, recurrent episode, mild    Major depressive disorder, recurrent episode, moderate    Recurrent major depression    Stage 3a chronic kidney disease       Orders Placed This Encounter   Procedures    Lipid panel     Order Specific Question:   Has the patient fasted?     Answer:   Yes     Order Specific Question:   Release to patient     Answer:   Immediate       1. History of  recurrent right lower extremity DVT he is on Xarelto and he has intermittent trace edema and chronic varicose dermatitis    2. Hyperlipidemia Not well controlled he is on Crestor 40 and TriCor 145 was added 3 months ago recheck today's    3.  Recurrent genital herpes about once a year and he gets Valtrex for 5 days new prescription was given    4.  Hypertension not well-controlled because of poor compliance I admonished his behavior  Valsartan was increased last time to 320/25    5.  History of premature ejaculation and he uses Zoloft for that, And Viagra for erectile dysfunction  He was taking Zoloft as needed I told him he needs to take it every day    6.  History of colonic polyps last colonoscopy was in 2017 and repeat colonoscopy in 01/2020 if on only 1  polyp nonmalignant told him to come back in 10 years    7.  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  History of kidney stones twice that he can remember drinking plenty of fluids    9. Erectile dysfunction and he Wanted an increase in the dose of Cialis therefore we gave him 20 mg    10.  Sensation of fullness in the throat status post steroid treatment by ENT however no improvement he was supposed to get a panendoscopy by Dr. Harrold Donath we did not discuss this today    11.  Diarrhea 4-5 times after antibiotic treatment for tooth extraction Flagyl that I gave him did not work he saw gastroenterology they did a colonoscopy that only found 1 polyp him back in 10 years  However he tells me he gets watery diarrhea still intermittently I therefore advised him to go back and talk to the gastroenterologist, which he did and they gave him loperamide and Metamucil strange enough I told him if he is not happy he should find another gastroenterologist    12.  CKD stage III 8 so far he is quite stable avoid nephrotoxins make sure his blood pressure is well controlled  He received his COVID-19 immunization  Encouraged to get the flu and shingles  vaccine  RTC in 3 months       This note was generated by the Epic EMR system/Speech recognition and may contain inherent errors or omissions not intended by the user. Grammatical errors, random word insertions, deletions and pronoun errors are occasional consequences of this technology due to software limitations.   Not all errors are caught or corrected. If there are questions or concerns about the content of this note or information contained within the body of this dictation they should be addressed directly with the author for clarification.      Signed by: Nyra Jabs

## 2020-10-07 ENCOUNTER — Other Ambulatory Visit: Payer: Self-pay | Admitting: Internal Medicine

## 2020-11-07 ENCOUNTER — Other Ambulatory Visit: Payer: Self-pay | Admitting: Internal Medicine

## 2020-11-07 MED ORDER — SERTRALINE HCL 50 MG PO TABS
50.0000 mg | ORAL_TABLET | Freq: Every day | ORAL | 0 refills | Status: DC
Start: 2020-11-07 — End: 2021-12-06

## 2021-01-26 ENCOUNTER — Other Ambulatory Visit: Payer: Self-pay | Admitting: Internal Medicine

## 2021-01-27 NOTE — Telephone Encounter (Signed)
Overdue for appointment 

## 2021-01-30 NOTE — Telephone Encounter (Signed)
Scheduled for tomorrow.

## 2021-01-31 ENCOUNTER — Encounter: Payer: Self-pay | Admitting: Internal Medicine

## 2021-01-31 ENCOUNTER — Ambulatory Visit: Payer: BC Managed Care – PPO | Admitting: Internal Medicine

## 2021-01-31 VITALS — BP 129/79 | HR 62 | Temp 97.5°F | Ht 70.0 in | Wt 211.0 lb

## 2021-01-31 DIAGNOSIS — I10 Essential (primary) hypertension: Secondary | ICD-10-CM

## 2021-01-31 DIAGNOSIS — F524 Premature ejaculation: Secondary | ICD-10-CM

## 2021-01-31 DIAGNOSIS — N1831 Chronic kidney disease, stage 3a: Secondary | ICD-10-CM

## 2021-01-31 DIAGNOSIS — E782 Mixed hyperlipidemia: Secondary | ICD-10-CM

## 2021-01-31 MED ORDER — TADALAFIL 20 MG PO TABS
20.0000 mg | ORAL_TABLET | Freq: Every day | ORAL | 3 refills | Status: AC | PRN
Start: 2021-01-31 — End: 2022-01-31

## 2021-01-31 NOTE — Progress Notes (Signed)
Date Time: 01/31/2021 12:35 PM  Patient Name: [name], [name]   DOB: [birth year]    Subjective:   [name] is a 65 y.o. male who presents for a follow-up visit    In December 2020 he had a tooth problem and extraction and he was given antibiotics for about a week  Since then he has been having daily 4-5 times diarrhea without blood or mucus but he has some tenesmus he said  I gave him a course of Flagyl this did not help he went to gastroenterology and he had a colonoscopy they found 1 polyp which was not malignant but told him to come back in 10 years  History has intermittent diarrhea and they gave him loperamide    His blood work shows that his cholesterol is high and his blood pressure is still high as well    06/03/2020  He feels very good in himself he has no breathing troubles or chest pains no bowel or urinary problems his medications were reviewed with him he has tolerated the higher dose of the valsartan and its time to check his kidney function    09/09/2020  The gastroenterologist gave him loperamide for his intermittent loose stools and he gave him Metamucil which she has not taken  When he drives the toes in his right foot become a little bit numb and sometimes painful he has to shake his foot that everything is okay he has had DVT in that leg and he was worried  He has been taking his fenofibrate along with rosuvastatin and its time to recheck  He has not been taking Zoloft for his premature ejaculation on a routine basis only as needed which I told him of the wrong way    01/31/2021  He feels very good in himself has no breathing troubles chest pain bowel or urinary problems  However he complains of tingling and numbness in his foot when he is driving less so otherwise    Past Medical History:     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     x2 last 2018    Functional diarrhea 06/03/2020    H/O lumbar discectomy 08/12/2018    2011     H/O: vasectomy 08/12/2018    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    Hyperplastic colonic polyp 08/12/2018    2017 return in 5 years    Hypertension     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    Mixed hyperlipidemia 08/12/2018    Nephrolithiasis 08/12/2018    x2 last 2018     Premature ejaculation 08/12/2018    Preop examination 09/29/2019    Stage 3a chronic kidney disease 09/09/2020    Throat fullness 08/28/2019       Past Surgical History:     Past Surgical History:   Procedure Laterality Date    BUNIONECTOMY  02/09/2015    COLONOSCOPY  07/2007    VASECTOMY  05/1992       Family History:     Family History   Problem Relation Age of Onset    Stroke Mother        Social History:     Social History     Socioeconomic History    Marital status: Divorced     Spouse name: None  Number of children: None    Years of education: None    Highest education level: None   Occupational History    None   Tobacco Use    Smoking status: Never Smoker    Smokeless tobacco: Never Used   Vaping Use    Vaping Use: Never used   Substance and Sexual Activity    Alcohol use: Yes    Drug use: No    Sexual activity: None   Other Topics Concern    None   Social History Narrative    None     Social Determinants of Health     Financial Resource Strain:     Difficulty of Paying Living Expenses: Not on file   Food Insecurity:     Worried About Programme researcher, broadcasting/film/video in the Last Year: Not on file    [employer] of Food in the Last Year: Not on file   Transportation Needs:     Lack of Transportation (Medical): Not on file    Lack of Transportation (Non-Medical): Not on file   Physical Activity:     Days of Exercise per Week: Not on file    Minutes of Exercise per Session: Not on file   Stress:     Feeling of Stress : Not on file   Social Connections:     Frequency of Communication with Friends and Family: Not on  file    Frequency of Social Gatherings with Friends and Family: Not on file    Attends Religious Services: Not on file    Active Member of Clubs or Organizations: Not on file    Attends Banker Meetings: Not on file    Marital Status: Not on file   Intimate Partner Violence:     Fear of Current or Ex-Partner: Not on file    Emotionally Abused: Not on file    Physically Abused: Not on file    Sexually Abused: Not on file   Housing Stability:     Unable to Pay for Housing in the Last Year: Not on file    Number of Places Lived in the Last Year: Not on file    Unstable Housing in the Last Year: Not on file       Allergies:   No Known Allergies    Medications:     Current Outpatient Medications   Medication Sig Dispense Refill    fenofibrate (TRICOR) 145 MG tablet Take 1 tablet (145 mg total) by mouth daily 90 tablet 1    loperamide (IMODIUM A-D) 2 MG tablet Take 1 tablet (2 mg total) by mouth 4 (four) times daily as needed for Diarrhea 40 tablet 3    Multiple Vitamins-Minerals (MULTIVITAMIN WITH MINERALS) tablet Take 1 tablet by mouth daily      Na sulfate-K sulfate-Mg sulfate (Suprep Bowel Prep Kit) 17.5-3.13-1.6 GM/177ML Solution Take one kit (two bottles in one package) following the instructions sent from your GI doctor's office. 354 mL 0    rivaroxaban (Xarelto) 20 MG Tab Take 1 tablet (20 mg total) by mouth daily with dinner 90 tablet 1    rosuvastatin (CRESTOR) 40 MG tablet TAKE 1 TABLET BY MOUTH EVERY DAY 90 tablet 0    sertraline (ZOLOFT) 50 MG tablet Take 1 tablet (50 mg total) by mouth daily 90 tablet 0    tadalafil (CIALIS) 20 MG tablet Take 1 tablet (20 mg total) by mouth daily as needed for Erectile Dysfunction As needed 18 tablet 3  valacyclovir (VALTREX) 1000 MG tablet Take 1 tablet (1,000 mg total) by mouth 2 (two) times daily 20 tablet 3    valsartan-hydroCHLOROthiazide (DIOVAN-HCT) 320-25 MG per tablet TAKE 1 TABLET BY MOUTH EVERY DAY 90 tablet 1    vitamin D  (CHOLECALCIFEROL) 1000 units tablet Take 5,000 Units by mouth daily       No current facility-administered medications for this visit.       Review of Systems:   A comprehensive review of systems was:   General ROS:   Respiratory ROS: no cough, shortness of breath, or wheezing  Cardiovascular ROS: no chest pain or dyspnea on exertion  Gastrointestinal ROS: occasional intermittent diarrhea  Genito-Urinary ROS: no dysuria, trouble voiding, or hematuria. Recurrent herpes simplex attacks  Musculoskeletal ROS: no joint pains or swelling  Neurological ROS: Intermittent numbness of the right toes while driving  Dermatologic ROS: negative for rash    Physical Exam:     Vitals:    01/31/21 1151   BP: 129/79   Pulse: 62   Temp: 97.5 F (36.4 C)   SpO2: 98%     BP Readings from Last 3 Encounters:   01/31/21 129/79   09/09/20 135/89   06/03/20 133/85     Wt Readings from Last 3 Encounters:   01/31/21 95.7 kg (211 lb)   09/09/20 95.7 kg (211 lb)   06/03/20 96.2 kg (212 lb)   Body mass index is 30.28 kg/m.    General appearance - alert, well appearing, and in no distress  Chest - clear to auscultation, no wheezes, rales or rhonchi, symmetric air entry  Heart - normal rate, regular rhythm, normal S1, S2, no murmurs, rubs, clicks or gallops  Abdomen - soft, nontender, nondistended, no masses or organomegaly  Neurological - alert, oriented, normal speech, no focal findings or movement disorder noted  Extremities - peripheral pulses normal,  no clubbing or cyanosis   trace edema and vertical dermatitis around the right ankle    Labs:     No results found for this or any previous visit (from the past 2016 hour(s)).    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    Hypertension    H/O lumbar discectomy    Nephrolithiasis    Mixed hyperlipidemia    Premature ejaculation    Herpes simplex infection of  penis    LAFB (left anterior fascicular block)    Throat fullness    COVID-19 virus infection    Adenomatous polyp of colon    Preop examination    Functional diarrhea    Major depressive disorder, recurrent episode, mild    Major depressive disorder, recurrent episode, moderate    Recurrent major depression    Stage 3a chronic kidney disease       Orders Placed This Encounter   Procedures    Lipid panel     Order Specific Question:   Has the patient fasted?     Answer:   Yes     Order Specific Question:   Release to patient     Answer:   Immediate    Ambulatory referral to Neurology     Referral Priority:   Routine     Referral Type:   Consultation     Referral Reason:   Specialty Services Required     Referred  to Provider:   Cathe Mons, MD     Requested Specialty:   Neurology     Number of Visits Requested:   1       1. 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 Hyperlipidemia Not well controlled he is supposed to be on Crestor 40 and TriCor however he does not have Crestor for quite a while therefore recheck and adjust as needed    3.  Recurrent genital herpes about once a year and he gets Valtrex for 5 days new prescription was given    4.  Hypertension not well-controlled because of poor compliance I admonished his behavior  Valsartan was increased last time to 320/25    5.  History of premature ejaculation and he uses Zoloft for that, And Viagra for erectile dysfunction  He was taking Zoloft as needed I told him he needs to take it every day    6.  History of colonic polyps last colonoscopy was in 2017 and repeat colonoscopy in 01/2020 if on only 1 polyp nonmalignant told him to come back in 10 years    7.  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  History of kidney stones twice that he can remember drinking plenty of fluids    9. Erectile dysfunction and he Wanted an  increase in the dose of Cialis therefore we gave him 20 mg    10.  Sensation of fullness in the throat status post steroid treatment by ENT however no improvement he was supposed to get a panendoscopy by Dr. Harrold Donath we did not discuss this today    11.  Diarrhea 4-5 times after antibiotic treatment for tooth extraction Flagyl that I gave him did not work he saw gastroenterology they did a colonoscopy that only found 1 polyp him back in 10 years  However he tells me he gets watery diarrhea still intermittently I therefore advised him to go back and talk to the gastroenterologist, which he did and they gave him loperamide and Metamucil strange enough I told him if he is not happy he should find another gastroenterologist    12.  CKD stage III 8 so far he is quite stable avoid nephrotoxins make sure his blood pressure is well controlled    13.  Longstanding history of tingling and numbness in his right foot when he is driving less so otherwise I have referred him to neurology for nerve conduction studies    He received his COVID-19 immunization  Encouraged to get the flu and shingles vaccine  RTC in 3 months       This note was generated by the Epic EMR system/Speech recognition and may contain inherent errors or omissions not intended by the user. Grammatical errors, random word insertions, deletions and pronoun errors are occasional consequences of this technology due to software limitations.   Not all errors are caught or corrected. If there are questions or concerns about the content of this note or information contained within the body of this dictation they should be addressed directly with the author for clarification.      Signed by: Nyra Jabs, MD

## 2021-02-01 LAB — LIPID PANEL
Cholesterol / HDL Ratio: 3.5 ratio (ref 0.0–5.0)
Cholesterol: 243 mg/dL — ABNORMAL HIGH (ref 100–199)
HDL: 70 mg/dL (ref >39)
LDL Chol Calculated (NIH): 162 mg/dL — ABNORMAL HIGH (ref 0–99)
Triglycerides: 66 mg/dL (ref 0–149)
VLDL Calculated: 11 mg/dL (ref 5–40)

## 2021-02-02 ENCOUNTER — Other Ambulatory Visit: Payer: Self-pay | Admitting: Internal Medicine

## 2021-02-02 MED ORDER — ROSUVASTATIN CALCIUM 40 MG PO TABS
40.0000 mg | ORAL_TABLET | Freq: Every day | ORAL | 0 refills | Status: DC
Start: 2021-02-02 — End: 2021-12-06

## 2021-02-14 ENCOUNTER — Other Ambulatory Visit: Payer: Self-pay | Admitting: Internal Medicine

## 2021-03-10 ENCOUNTER — Ambulatory Visit: Payer: BC Managed Care – PPO | Admitting: Internal Medicine

## 2021-04-29 ENCOUNTER — Other Ambulatory Visit: Payer: Self-pay | Admitting: Internal Medicine

## 2021-04-30 ENCOUNTER — Other Ambulatory Visit: Payer: Self-pay | Admitting: Internal Medicine

## 2021-05-01 NOTE — Telephone Encounter (Signed)
Time for his 3 months follow-up

## 2021-05-03 NOTE — Telephone Encounter (Signed)
lvm

## 2021-05-28 ENCOUNTER — Other Ambulatory Visit: Payer: Self-pay | Admitting: Internal Medicine

## 2021-05-29 NOTE — Telephone Encounter (Signed)
He is overdue for his appointment

## 2021-06-01 ENCOUNTER — Other Ambulatory Visit: Payer: Self-pay | Admitting: Internal Medicine

## 2021-06-01 NOTE — Telephone Encounter (Signed)
Scheduled for 06/02/2021

## 2021-06-02 ENCOUNTER — Telehealth: Payer: BC Managed Care – PPO | Admitting: Internal Medicine

## 2021-06-02 ENCOUNTER — Encounter: Payer: Self-pay | Admitting: Internal Medicine

## 2021-06-02 VITALS — BP 136/87 | HR 61 | Ht 70.0 in | Wt 208.0 lb

## 2021-06-02 DIAGNOSIS — A6001 Herpesviral infection of penis: Secondary | ICD-10-CM

## 2021-06-02 DIAGNOSIS — N1831 Chronic kidney disease, stage 3a: Secondary | ICD-10-CM

## 2021-06-02 DIAGNOSIS — I10 Essential (primary) hypertension: Secondary | ICD-10-CM

## 2021-06-02 MED ORDER — VALACYCLOVIR HCL 1 G PO TABS
1000.0000 mg | ORAL_TABLET | Freq: Two times a day (BID) | ORAL | 3 refills | Status: DC
Start: 2021-06-02 — End: 2022-07-27

## 2021-06-02 NOTE — Progress Notes (Signed)
Telemedicine visit      Date Time: 06/02/2021 10:05 AM  Patient Name: [name], [name]   DOB: 1956-06-27    Subjective:   [name] is a 65 y.o. male who presents for a follow-up visit  This is a telemedicine visit  Verbal consent was obtained    In December 2020 he had a tooth problem and extraction and he was given antibiotics for about a week  Since then he has been having daily 4-5 times diarrhea without blood or mucus but he has some tenesmus he said  I gave him a course of Flagyl this did not help he went to gastroenterology and he had a colonoscopy they found 1 polyp which was not malignant but told him to come back in 10 years  History has intermittent diarrhea and they gave him loperamide    His blood work shows that his cholesterol is high and his blood pressure is still high as well    06/03/2020  He feels very good in himself he has no breathing troubles or chest pains no bowel or urinary problems his medications were reviewed with him he has tolerated the higher dose of the valsartan and its time to check his kidney function    09/09/2020  The gastroenterologist gave him loperamide for his intermittent loose stools and he gave him Metamucil which she has not taken  When he drives the toes in his right foot become a little bit numb and sometimes painful he has to shake his foot that everything is okay he has had DVT in that leg and he was worried  He has been taking his fenofibrate along with rosuvastatin and its time to recheck  He has not been taking Zoloft for his premature ejaculation on a routine basis only as needed which I told him of the wrong way    01/31/2021  He feels very good in himself has no breathing troubles chest pain bowel or urinary problems  However he complains of tingling and numbness in his foot when he is driving less so otherwise    06/02/2021  He does not have a refill on his Valtrex and unfortunately he has an outbreak and he did not have the medicines  He is trying to  exercise and lose weight he is not very successful unfortunately  No changes to the rest of his medications no breathing trouble chest pain no bowel or urinary problems  Medications reviewed and reconciled    Past Medical History:     Past Medical History:   Diagnosis Date   . Adenomatous polyp of colon 08/28/2019   . COVID-19 virus infection 08/28/2019    07/06/2019   . 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 Functional diarrhea 06/03/2020   . H/O lumbar discectomy 08/12/2018    2011    . H/O: vasectomy 08/12/2018   . 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   . Hyperplastic colonic polyp 08/12/2018    2017 return in 5 years   . Hypertension    . 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   . Mixed hyperlipidemia 08/12/2018   . Nephrolithiasis 08/12/2018    x2 last 2018    . Premature ejaculation 08/12/2018   . Preop examination 09/29/2019   . Stage 3a chronic kidney disease 09/09/2020   . Throat fullness 08/28/2019       Past Surgical History:     Past Surgical History:  Procedure Laterality Date   . BUNIONECTOMY  02/09/2015   . COLONOSCOPY  07/2007   . VASECTOMY  05/1992       Family History:     Family History   Problem Relation Age of Onset   . Stroke Mother        Social History:     Social History     Socioeconomic History   . Marital status: Divorced     Spouse name: None   . Number of children: None   . Years of education: None   . Highest education level: None   Occupational History   . None   Tobacco Use   . Smoking status: Never Smoker   . Smokeless tobacco: Never Used   Vaping Use   . Vaping Use: Never used   Substance and Sexual Activity   . Alcohol use: Yes   . Drug use: No   . Sexual activity: None   Other Topics Concern   . None   Social History Narrative   . None     Social Determinants of Health     Financial Resource Strain: Not on file   Food Insecurity: Not on file   Transportation Needs: Not on file   Physical Activity: Not on file   Stress: Not on file    Social Connections: Not on file   Intimate Partner Violence: Not on file   Housing Stability: Not on file       Allergies:   No Known Allergies    Medications:     Current Outpatient Medications   Medication Sig Dispense Refill   . loperamide (IMODIUM A-D) 2 MG tablet Take 1 tablet (2 mg total) by mouth 4 (four) times daily as needed for Diarrhea 40 tablet 3   . Multiple Vitamins-Minerals (MULTIVITAMIN WITH MINERALS) tablet Take 1 tablet by mouth daily     . Na sulfate-K sulfate-Mg sulfate (Suprep Bowel Prep Kit) 17.5-3.13-1.6 GM/177ML Solution Take one kit (two bottles in one package) following the instructions sent from your GI doctor's office. 354 mL 0   . rosuvastatin (CRESTOR) 40 MG tablet Take 1 tablet (40 mg total) by mouth daily 90 tablet 0   . sertraline (ZOLOFT) 50 MG tablet Take 1 tablet (50 mg total) by mouth daily 90 tablet 0   . tadalafil (CIALIS) 20 MG tablet Take 1 tablet (20 mg total) by mouth daily as needed for Erectile Dysfunction As needed 18 tablet 3   . valACYclovir (VALTREX) 1000 MG tablet Take 1 tablet (1,000 mg total) by mouth 2 (two) times daily 20 tablet 3   . valsartan-hydroCHLOROthiazide (DIOVAN-HCT) 320-25 MG per tablet TAKE 1 TABLET BY MOUTH EVERY DAY 90 tablet 1   . vitamin D (CHOLECALCIFEROL) 1000 units tablet Take 5,000 Units by mouth daily     . Xarelto 20 MG Tab TAKE 1 TABLET BY MOUTH EVERY DAY WITH DINNER 90 tablet 1     No current facility-administered medications for this visit.       Review of Systems:   A comprehensive review of systems was:   General ROS:   Respiratory ROS: no cough, shortness of breath, or wheezing  Cardiovascular ROS: no chest pain or dyspnea on exertion  Gastrointestinal ROS: occasional intermittent diarrhea  Genito-Urinary ROS: no dysuria, trouble voiding, or hematuria. Recurrent herpes simplex attacks  Musculoskeletal ROS: no joint pains or swelling  Neurological ROS: Intermittent numbness of the right toes while driving  Dermatologic ROS: negative  for rash  Physical Exam:     Vitals:    06/02/21 0955   BP: 136/87   Pulse: 61   SpO2: 93%     BP Readings from Last 3 Encounters:   06/02/21 136/87   01/31/21 129/79   09/09/20 135/89     Wt Readings from Last 3 Encounters:   06/02/21 94.3 kg (208 lb)   01/31/21 95.7 kg (211 lb)   09/09/20 95.7 kg (211 lb)   Body mass index is 29.84 kg/m.    On the visual examination was conducted during this telemedicine visit    Labs:     No results found for this or any previous visit (from the past 2016 hour(s)).    Assessment and Plan:     Patient Active Problem List   Diagnosis   . H/O: vasectomy   . Hyperplastic colonic polyp   . 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 Hypertension   . H/O lumbar discectomy   . Nephrolithiasis   . Mixed hyperlipidemia   . Premature ejaculation   . Herpes simplex infection of penis   . LAFB (left anterior fascicular block)   . Throat fullness   . COVID-19 virus infection   . Adenomatous polyp of colon   . Preop examination   . Functional diarrhea   . Major depressive disorder, recurrent episode, mild   . Major depressive disorder, recurrent episode, moderate   . Recurrent major depression   . Stage 3a chronic kidney disease       No orders of the defined types were placed in this encounter.      1. 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 Hyperlipidemia Not well controlled he is supposed to be on Crestor 40 and TriCor however he does not have Crestor for quite a while therefore recheck next visit    3.  Recurrent genital herpes about once a year and he gets Valtrex for 5 days new prescription was given    4.  Hypertension not well-controlled because of poor compliance I admonished his behavior  Valsartan was increased last time to 320/25    5.  History of premature ejaculation and he uses Zoloft for that, And Viagra for erectile dysfunction  He was taking Zoloft as needed I  told him he needs to take it every day    6.  History of colonic polyps last colonoscopy was in 2017 and repeat colonoscopy in 01/2020 if on only 1 polyp nonmalignant told him to come back in 10 years    7.  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  History of kidney stones twice that he can remember drinking plenty of fluids    9. Erectile dysfunction and he Wanted an increase in the dose of Cialis therefore we gave him 20 mg    10.  Sensation of fullness in the throat status post steroid treatment by ENT however no improvement he was supposed to get a panendoscopy by Dr. Harrold Donath we did not discuss this today    11.  Diarrhea 4-5 times after antibiotic treatment for tooth extraction Flagyl that I gave him did not work he saw gastroenterology they did a colonoscopy that only found 1 polyp him back in 10 years  However he tells me he gets watery diarrhea still intermittently I therefore advised him to go back  and talk to the gastroenterologist, which he did and they gave him loperamide and Metamucil strange enough I told him if he is not happy he should find another gastroenterologist    12.  CKD stage III 8 so far he is quite stable avoid nephrotoxins make sure his blood pressure is well controlled    13.  Longstanding history of tingling and numbness in his right foot when he is driving less so otherwise I have referred him to neurology for nerve conduction studies    He received his COVID-19 immunization  Encouraged to get the flu and shingles vaccine  RTC in 3 months       This note was generated by the Epic EMR system/Speech recognition and may contain inherent errors or omissions not intended by the user. Grammatical errors, random word insertions, deletions and pronoun errors are occasional consequences of this technology due to software limitations.   Not all errors are caught or corrected. If there are questions or concerns about the content of this note or information contained within  the body of this dictation they should be addressed directly with the author for clarification.      Signed by: Nyra Jabs, MD

## 2021-06-28 ENCOUNTER — Other Ambulatory Visit: Payer: Self-pay | Admitting: Internal Medicine

## 2021-07-05 ENCOUNTER — Other Ambulatory Visit: Payer: Self-pay | Admitting: Internal Medicine

## 2021-08-27 ENCOUNTER — Other Ambulatory Visit: Payer: Self-pay | Admitting: Internal Medicine

## 2021-08-29 ENCOUNTER — Other Ambulatory Visit: Payer: Self-pay | Admitting: Internal Medicine

## 2021-08-29 DIAGNOSIS — E782 Mixed hyperlipidemia: Secondary | ICD-10-CM

## 2021-08-29 DIAGNOSIS — I10 Essential (primary) hypertension: Secondary | ICD-10-CM

## 2021-08-29 DIAGNOSIS — I444 Left anterior fascicular block: Secondary | ICD-10-CM

## 2021-08-29 DIAGNOSIS — Z0000 Encounter for general adult medical examination without abnormal findings: Secondary | ICD-10-CM

## 2021-08-29 NOTE — Telephone Encounter (Signed)
Rouuted to [place]

## 2021-08-29 NOTE — Telephone Encounter (Signed)
He is due for his follow-up

## 2021-09-02 LAB — CBC AND DIFFERENTIAL
Baso(Absolute): 0 10*3/uL (ref 0.0–0.2)
Basophils Automated: 0 %
Eosinophils Absolute: 0.1 10*3/uL (ref 0.0–0.4)
Eosinophils Automated: 2 %
Hematocrit: 50.6 % (ref 37.5–51.0)
Hemoglobin: 16.7 g/dL (ref 13.0–17.7)
Immature Granulocytes Absolute: 0 10*3/uL (ref 0.0–0.1)
Immature Granulocytes: 0 %
Lymphocytes Absolute: 1.5 10*3/uL (ref 0.7–3.1)
Lymphocytes Automated: 48 %
MCH: 30.3 pg (ref 26.6–33.0)
MCHC: 33 g/dL (ref 31.5–35.7)
MCV: 92 fL (ref 79–97)
Monocytes Absolute: 0.4 10*3/uL (ref 0.1–0.9)
Monocytes: 13 %
Neutrophils Absolute Count: 1.2 10*3/uL — ABNORMAL LOW (ref 1.4–7.0)
Neutrophils: 37 %
Platelets: 127 10*3/uL — ABNORMAL LOW (ref 150–450)
RBC: 5.51 x10E6/uL (ref 4.14–5.80)
RDW: 13 % (ref 11.6–15.4)
WBC: 3.1 10*3/uL — ABNORMAL LOW (ref 3.4–10.8)

## 2021-09-02 LAB — BASIC METABOLIC PANEL
BUN / Creatinine Ratio: 14 (ref 10–24)
BUN: 19 mg/dL (ref 8–27)
CO2: 25 mmol/L (ref 20–29)
Calcium: 9.9 mg/dL (ref 8.6–10.2)
Chloride: 104 mmol/L (ref 96–106)
Creatinine: 1.33 mg/dL — ABNORMAL HIGH (ref 0.76–1.27)
Glucose: 95 mg/dL (ref 65–99)
Potassium: 4.2 mmol/L (ref 3.5–5.2)
Sodium: 143 mmol/L (ref 134–144)
eGFR: 60 mL/min/{1.73_m2} (ref >59)

## 2021-09-02 LAB — LIPID PANEL
Cholesterol / HDL Ratio: 3.7 ratio (ref 0.0–5.0)
Cholesterol: 300 mg/dL — ABNORMAL HIGH (ref 100–199)
HDL: 81 mg/dL (ref >39)
LDL Chol Calculated (NIH): 202 mg/dL — ABNORMAL HIGH (ref 0–99)
Triglycerides: 100 mg/dL (ref 0–149)
VLDL Calculated: 17 mg/dL (ref 5–40)

## 2021-09-02 LAB — HEPATIC FUNCTION PANEL
ALT: 28 IU/L (ref 0–44)
AST (SGOT): 30 IU/L (ref 0–40)
Albumin: 4.5 g/dL (ref 3.8–4.8)
Alkaline Phosphatase: 91 IU/L (ref 44–121)
Bilirubin Direct: 0.12 mg/dL (ref 0.00–0.40)
Bilirubin, Total: 0.4 mg/dL (ref 0.0–1.2)
Protein, Total: 6.9 g/dL (ref 6.0–8.5)

## 2021-09-02 LAB — IRON DEFICIENCY PROFILE
Ferritin: 102 ng/mL (ref 30–400)
Iron Saturation: 23 % (ref 15–55)
Iron: 62 ug/dL (ref 38–169)
TIBC: 268 ug/dL (ref 250–450)
UIBC: 206 ug/dL (ref 111–343)

## 2021-09-02 LAB — VITAMIN D,25 OH,TOTAL: Vitamin D 25-Hydroxy: 50.2 ng/mL (ref 30.0–100.0)

## 2021-09-02 LAB — VITAMIN B12: Vitamin B-12: 554 pg/mL (ref 232–1245)

## 2021-09-02 LAB — TSH: TSH: 1.19 u[IU]/mL (ref 0.450–4.500)

## 2021-09-02 LAB — PSA, TOTAL AND FREE
PSA, Free Pct: 27.8 %
PSA, Free: 0.25 ng/mL
Prostate Specific Antigen, Total: 0.9 ng/mL (ref 0.0–4.0)

## 2021-09-02 LAB — HEMOGLOBIN A1C: Hemoglobin A1C: 5.7 % — ABNORMAL HIGH (ref 4.8–5.6)

## 2021-09-06 ENCOUNTER — Ambulatory Visit: Payer: BC Managed Care – PPO | Admitting: Internal Medicine

## 2021-09-06 ENCOUNTER — Encounter: Payer: Self-pay | Admitting: Internal Medicine

## 2021-09-06 VITALS — BP 132/82 | HR 59 | Temp 97.1°F | Ht 70.0 in | Wt 213.2 lb

## 2021-09-06 DIAGNOSIS — K635 Polyp of colon: Secondary | ICD-10-CM

## 2021-09-06 DIAGNOSIS — R7303 Prediabetes: Secondary | ICD-10-CM

## 2021-09-06 DIAGNOSIS — I82411 Acute embolism and thrombosis of right femoral vein: Secondary | ICD-10-CM

## 2021-09-06 DIAGNOSIS — F524 Premature ejaculation: Secondary | ICD-10-CM

## 2021-09-06 DIAGNOSIS — I10 Essential (primary) hypertension: Secondary | ICD-10-CM

## 2021-09-06 HISTORY — DX: Prediabetes: R73.03

## 2021-09-06 NOTE — Progress Notes (Signed)
Date Time: 09/06/2021 11:13 AM  Patient Name: [name], [name]   DOB: November 14, 1956    Subjective:   [name] is a 64 y.o. male who presents for a follow-up visit    In December 2020 he had a tooth problem and extraction and he was given antibiotics for about a week  Since then he has been having daily 4-5 times diarrhea without blood or mucus but he has some tenesmus he said  I gave him a course of Flagyl this did not help he went to gastroenterology and he had a colonoscopy they found 1 polyp which was not malignant but told him to come back in 10 years  History has intermittent diarrhea and they gave him loperamide    His blood work shows that his cholesterol is high and his blood pressure is still high as well    06/03/2020  He feels very good in himself he has no breathing troubles or chest pains no bowel or urinary problems his medications were reviewed with him he has tolerated the higher dose of the valsartan and its time to check his kidney function    09/09/2020  The gastroenterologist gave him loperamide for his intermittent loose stools and he gave him Metamucil which she has not taken  When he drives the toes in his right foot become a little bit numb and sometimes painful he has to shake his foot that everything is okay he has had DVT in that leg and he was worried  He has been taking his fenofibrate along with rosuvastatin and its time to recheck  He has not been taking Zoloft for his premature ejaculation on a routine basis only as needed which I told him of the wrong way    01/31/2021  He feels very good in himself has no breathing troubles chest pain bowel or urinary problems  However he complains of tingling and numbness in his foot when he is driving less so otherwise    06/02/2021  He does not have a refill on his Valtrex and unfortunately he has an outbreak and he did not have the medicines  He is trying to exercise and lose weight he is not very successful unfortunately  No changes to  the rest of his medications no breathing trouble chest pain no bowel or urinary problems  Medications reviewed and reconciled    09/06/2021  He did see the pain management doctors we did a CAT scan and 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 Loma Newton who referred him to Dr. 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    x2 last 2018    Functional diarrhea 06/03/2020    H/O lumbar discectomy 08/12/2018    2011     H/O: vasectomy 08/12/2018    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    Hyperplastic colonic polyp 08/12/2018    2017 return in 5 years    Hypertension     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  Mixed hyperlipidemia 08/12/2018    Nephrolithiasis 08/12/2018    x2 last 2018     Prediabetes 09/06/2021    Premature ejaculation 08/12/2018    Preop examination 09/29/2019    Stage 3a chronic kidney disease 09/09/2020    Throat fullness 08/28/2019       Past Surgical History:     Past Surgical History:   Procedure Laterality Date    BUNIONECTOMY  02/09/2015    COLONOSCOPY  07/2007    VASECTOMY  05/1992       Family History:     Family History   Problem Relation Age of Onset    Stroke Mother        Social History:     Social History     Socioeconomic History    Marital status: Divorced     Spouse name: None    Number of children: None    Years of education: None    Highest education level: None   Occupational History    None   Tobacco Use    Smoking status: Never    Smokeless tobacco: Never   Vaping Use    Vaping Use: Never used   Substance and Sexual Activity    Alcohol use: Yes    Drug use: No    Sexual activity:  None   Other Topics Concern    None   Social History Narrative    None     Social Determinants of Health     Financial Resource Strain: Not on file   Food Insecurity: Not on file   Transportation Needs: Not on file   Physical Activity: Not on file   Stress: Not on file   Social Connections: Not on file   Intimate Partner Violence: Not on file   Housing Stability: Not on file       Allergies:   No Known Allergies    Medications:     Current Outpatient Medications   Medication Sig Dispense Refill    fenofibrate (TRICOR) 145 MG tablet TAKE 1 TABLET BY MOUTH EVERY DAY 90 tablet 1    loperamide (IMODIUM A-D) 2 MG tablet Take 1 tablet (2 mg total) by mouth 4 (four) times daily as needed for Diarrhea 40 tablet 3    Multiple Vitamins-Minerals (MULTIVITAMIN WITH MINERALS) tablet Take 1 tablet by mouth daily      rosuvastatin (CRESTOR) 40 MG tablet Take 1 tablet (40 mg total) by mouth daily 90 tablet 0    sertraline (ZOLOFT) 50 MG tablet Take 1 tablet (50 mg total) by mouth daily 90 tablet 0    tadalafil (CIALIS) 20 MG tablet Take 1 tablet (20 mg total) by mouth daily as needed for Erectile Dysfunction As needed 18 tablet 3    valACYclovir (VALTREX) 1000 MG tablet Take 1 tablet (1,000 mg total) by mouth 2 (two) times daily 20 tablet 3    valsartan-hydroCHLOROthiazide (DIOVAN-HCT) 320-25 MG per tablet TAKE 1 TABLET BY MOUTH EVERY DAY 90 tablet 1    vitamin D (CHOLECALCIFEROL) 1000 units tablet Take 5,000 Units by mouth daily      Xarelto 20 MG Tab TAKE 1 TABLET BY MOUTH EVERY DAY WITH DINNER 90 tablet 1     No current facility-administered medications for this visit.       Review of Systems:   A comprehensive review of systems was:   General ROS:   Respiratory ROS: no cough, shortness of breath, or wheezing  Cardiovascular ROS: no chest pain or dyspnea  on exertion  Gastrointestinal ROS: occasional intermittent diarrhea  Genito-Urinary ROS: no dysuria, trouble voiding, or hematuria. Recurrent herpes simplex  attacks  Musculoskeletal ROS: no joint pains or swelling  Neurological ROS: Intermittent numbness of the right toes while driving  Dermatologic ROS: negative for rash    Physical Exam:     Vitals:    09/06/21 0954   BP: 132/82   Pulse: (!) 59   Temp: 97.1 F (36.2 C)   SpO2: 96%     BP Readings from Last 3 Encounters:   09/06/21 132/82   06/02/21 136/87   01/31/21 129/79     Wt Readings from Last 3 Encounters:   09/06/21 96.7 kg (213 lb 3.2 oz)   06/02/21 94.3 kg (208 lb)   01/31/21 95.7 kg (211 lb)   Body mass index is 30.59 kg/m.    General appearance - alert, well appearing, and in no distress  Chest - clear to auscultation, no wheezes, rales or rhonchi, symmetric air entry  Heart - normal rate, regular rhythm, normal S1, S2, no murmurs, rubs, clicks or gallops  Abdomen - soft, nontender, nondistended, no masses or organomegaly  Neurological - alert, oriented, normal speech, no focal findings or movement disorder noted  Extremities - peripheral pulses normal,  no clubbing or cyanosis   trace edema and vertical dermatitis around the right ankle    Labs:     Recent Results (from the past 2016 hour(s))   Lipid panel    Collection Time: 09/01/21  8:19 AM   Result Value Ref Range    Cholesterol 300 (H) 100 - 199 mg/dL    Triglycerides 161 0 - 149 mg/dL    HDL 81 >09 mg/dL    VLDL Calculated 17 5 - 40 mg/dL    LDL Chol Calculated (NIH) 604 (H) 0 - 99 mg/dL    Cholesterol / HDL Ratio 3.7 0.0 - 5.0 ratio   TSH    Collection Time: 09/01/21  8:19 AM   Result Value Ref Range    TSH 1.190 0.450 - 4.500 uIU/mL   Vitamin D,25 OH, Total    Collection Time: 09/01/21  8:19 AM   Result Value Ref Range    Vitamin D 25-Hydroxy 50.2 30.0 - 100.0 ng/mL   Vitamin B12    Collection Time: 09/01/21  8:19 AM   Result Value Ref Range    Vitamin B-12 554 232 - 1,245 pg/mL   Hemoglobin A1C    Collection Time: 09/01/21  8:19 AM   Result Value Ref Range    Hemoglobin A1C 5.7 (H) 4.8 - 5.6 %   Basic Metabolic Panel    Collection Time: 09/01/21   8:19 AM   Result Value Ref Range    Glucose 95 65 - 99 mg/dL    BUN 19 8 - 27 mg/dL    Creatinine 5.40 (H) 0.76 - 1.27 mg/dL    eGFR 60 >98 JX/BJY/7.82    BUN / Creatinine Ratio 14 10 - 24    Sodium 143 134 - 144 mmol/L    Potassium 4.2 3.5 - 5.2 mmol/L    Chloride 104 96 - 106 mmol/L    CO2 25 20 - 29 mmol/L    Calcium 9.9 8.6 - 10.2 mg/dL   CBC and differential    Collection Time: 09/01/21  8:19 AM   Result Value Ref Range    WBC 3.1 (L) 3.4 - 10.8 x10E3/uL    RBC 5.51 4.14 - 5.80 x10E6/uL  Hemoglobin 16.7 13.0 - 17.7 g/dL    Hematocrit 16.1 09.6 - 51.0 %    MCV 92 79 - 97 fL    MCH 30.3 26.6 - 33.0 pg    MCHC 33.0 31.5 - 35.7 g/dL    RDW 04.5 40.9 - 81.1 %    Platelets 127 (L) 150 - 450 x10E3/uL    Neutrophils 37 Not Estab. %    Lymphocytes Automated 48 Not Estab. %    Monocytes 13 Not Estab. %    Eosinophils Automated 2 Not Estab. %    Basophils Automated 0 Not Estab. %    Neutrophils Absolute Count 1.2 (L) 1.4 - 7.0 x10E3/uL    Lymphocytes Absolute 1.5 0.7 - 3.1 x10E3/uL    Monocytes Absolute 0.4 0.1 - 0.9 x10E3/uL    Eosinophils Absolute 0.1 0.0 - 0.4 x10E3/uL    Baso(Absolute) 0.0 0.0 - 0.2 x10E3/uL    Immature Granulocytes 0 Not Estab. %    Immature Granulocytes Absolute 0.0 0.0 - 0.1 x10E3/uL   Iron Deficiency Profile    Collection Time: 09/01/21  8:19 AM   Result Value Ref Range    TIBC 268 250 - 450 ug/dL    UIBC 914 782 - 956 ug/dL    Iron 62 38 - 213 ug/dL    Iron Saturation 23 15 - 55 %    Ferritin 102 30 - 400 ng/mL   Hepatic function panel (LFT)    Collection Time: 09/01/21  8:19 AM   Result Value Ref Range    Protein, Total 6.9 6.0 - 8.5 g/dL    Albumin 4.5 3.8 - 4.8 g/dL    Bilirubin, Total 0.4 0.0 - 1.2 mg/dL    Bilirubin Direct 0.86 0.00 - 0.40 mg/dL    Alkaline Phosphatase 91 44 - 121 IU/L    AST (SGOT) 30 0 - 40 IU/L    ALT 28 0 - 44 IU/L   PSA, total and free    Collection Time: 09/01/21  8:19 AM   Result Value Ref Range    Prostate Specific Antigen, Total 0.9 0.0 - 4.0 ng/mL    PSA, Free  0.25 N/A ng/mL    PSA, Free Pct 27.8 %       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      1. 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 Hyperlipidemia Not well controlled he is supposed to be on Crestor 40 and TriCor however he does not have Crestor for quite a while therefore recheck  next visit    3.  Recurrent genital herpes about once a year and he gets Valtrex for 5 days new prescription was given    4.  Hypertension not well-controlled because of poor compliance I admonished his behavior  Valsartan was increased last time to 320/25 and the levels are much better now    5.  History of premature ejaculation and he uses Zoloft for that, And Viagra for erectile dysfunction  He was taking Zoloft as needed I told him he needs to take it every day    6.  History of colonic polyps last colonoscopy was in 2017 and repeat colonoscopy in 01/2020 if on only 1 polyp nonmalignant told him to come back in 10 years    7.  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  History of kidney stones twice that he can remember drinking plenty of fluids    9. Erectile dysfunction and he Wanted an increase in the dose of Cialis therefore we  gave him 20 mg    10.  Sensation of fullness in the throat status post steroid treatment by ENT however no improvement he was supposed to get a panendoscopy by Dr. Harrold Donath we did not discuss this today    11.  Diarrhea 4-5 times after antibiotic treatment for tooth extraction Flagyl that I gave him did not work he saw gastroenterology they did a colonoscopy that only found 1 polyp him back in 10 years  However he tells me he gets watery diarrhea still intermittently I therefore advised him to go back and talk to the gastroenterologist, which he did and they gave him loperamide and Metamucil strange enough I told him if he is not happy he should find another gastroenterologist    12.  CKD stage III 8 so far he is quite stable avoid nephrotoxins make sure his blood pressure is well controlled    13.  Longstanding history of tingling and numbness in his right foot when he is driving less so otherwise I have referred him to neurology for nerve conduction studies    14.  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 Loma Newton who is referring him to Dr. Doreatha Martin from vascular surgery    He received his COVID-19 immunization  Encouraged to get the flu and shingles vaccine  RTC in 3 months       This note was generated by the [employer] EMR system/Speech recognition and may contain inherent errors or omissions not intended by the user. Grammatical errors, random word insertions, deletions and pronoun errors  are occasional consequences of this technology due to software limitations.   Not all errors are caught or corrected. If there are questions or concerns about the content of this note or information contained within the body of this dictation they should be addressed directly with the author for clarification.      Signed by: Nyra Jabs, MD

## 2021-09-30 ENCOUNTER — Emergency Department: Payer: BC Managed Care – PPO

## 2021-09-30 ENCOUNTER — Emergency Department
Admission: EM | Admit: 2021-09-30 | Discharge: 2021-09-30 | Disposition: A | Discharge status: Home / Self Care | Payer: BC Managed Care – PPO | Attending: Emergency Medicine | Admitting: Emergency Medicine

## 2021-09-30 DIAGNOSIS — N132 Hydronephrosis with renal and ureteral calculous obstruction: Secondary | ICD-10-CM | POA: Insufficient documentation

## 2021-09-30 DIAGNOSIS — N201 Calculus of ureter: Secondary | ICD-10-CM

## 2021-09-30 DIAGNOSIS — N289 Disorder of kidney and ureter, unspecified: Secondary | ICD-10-CM | POA: Insufficient documentation

## 2021-09-30 DIAGNOSIS — Z87442 Personal history of urinary calculi: Secondary | ICD-10-CM | POA: Insufficient documentation

## 2021-09-30 LAB — CBC AND DIFFERENTIAL
Absolute NRBC: 0 10*3/uL (ref 0.00–0.00)
Basophils Absolute Automated: 0.01 10*3/uL (ref 0.00–0.08)
Basophils Automated: 0.3 %
Eosinophils Absolute Automated: 0.08 10*3/uL (ref 0.00–0.44)
Eosinophils Automated: 2.2 %
Hematocrit: 46.1 % (ref 37.6–49.6)
Hgb: 15.8 g/dL (ref 12.5–17.1)
Immature Granulocytes Absolute: 0 10*3/uL (ref 0.00–0.07)
Immature Granulocytes: 0 %
Lymphocytes Absolute Automated: 1.82 10*3/uL (ref 0.42–3.22)
Lymphocytes Automated: 49.5 %
MCH: 30.9 pg (ref 25.1–33.5)
MCHC: 34.3 g/dL (ref 31.5–35.8)
MCV: 90.2 fL (ref 78.0–96.0)
MPV: 10.9 fL (ref 8.9–12.5)
Monocytes Absolute Automated: 0.58 10*3/uL (ref 0.21–0.85)
Monocytes: 15.8 %
Neutrophils Absolute: 1.19 10*3/uL (ref 1.10–6.33)
Neutrophils: 32.2 %
Nucleated RBC: 0 /100 WBC (ref 0.0–0.0)
Platelets: 156 10*3/uL (ref 142–346)
RBC: 5.11 10*6/uL (ref 4.20–5.90)
RDW: 12 % (ref 11–15)
WBC: 3.68 10*3/uL (ref 3.10–9.50)

## 2021-09-30 LAB — URINALYSIS REFLEX TO MICROSCOPIC EXAM - REFLEX TO CULTURE
Bilirubin, UA: NEGATIVE
Glucose, UA: NEGATIVE
Ketones UA: NEGATIVE
Leukocyte Esterase, UA: NEGATIVE
Nitrite, UA: NEGATIVE
Protein, UR: 30 — AB
Specific Gravity UA: 1.015 (ref 1.001–1.035)
Urine pH: 6 (ref 5.0–8.0)
Urobilinogen, UA: NEGATIVE mg/dL (ref 0.2–2.0)

## 2021-09-30 LAB — COMPREHENSIVE METABOLIC PANEL
ALT: 30 U/L (ref 0–55)
AST (SGOT): 26 U/L (ref 5–41)
Albumin/Globulin Ratio: 1.4 (ref 0.9–2.2)
Albumin: 3.8 g/dL (ref 3.5–5.0)
Alkaline Phosphatase: 69 U/L (ref 37–117)
Anion Gap: 7 (ref 5.0–15.0)
BUN: 28 mg/dL (ref 9.0–28.0)
Bilirubin, Total: 0.8 mg/dL (ref 0.2–1.2)
CO2: 28 mEq/L (ref 17–29)
Calcium: 9.5 mg/dL (ref 8.5–10.5)
Chloride: 107 mEq/L (ref 99–111)
Creatinine: 1.7 mg/dL — ABNORMAL HIGH (ref 0.5–1.5)
Globulin: 2.7 g/dL (ref 2.0–3.6)
Glucose: 104 mg/dL — ABNORMAL HIGH (ref 70–100)
Potassium: 3.9 mEq/L (ref 3.5–5.3)
Protein, Total: 6.5 g/dL (ref 6.0–8.3)
Sodium: 142 mEq/L (ref 135–145)

## 2021-09-30 LAB — GFR: EGFR: 40.8

## 2021-09-30 LAB — LIPASE: Lipase: 40 U/L (ref 8–78)

## 2021-09-30 MED ORDER — MORPHINE SULFATE 4 MG/ML IJ/IV SOLN (WRAP)
4.0000 mg | Freq: Once | Status: AC
Start: 2021-09-30 — End: 2021-09-30
  Administered 2021-09-30: 05:00:00 4 mg via INTRAVENOUS
  Filled 2021-09-30: qty 1

## 2021-09-30 MED ORDER — ONDANSETRON HCL 4 MG/2ML IJ SOLN
4.0000 mg | Freq: Once | INTRAMUSCULAR | Status: AC
Start: 2021-09-30 — End: 2021-09-30
  Administered 2021-09-30: 05:00:00 4 mg via INTRAVENOUS
  Filled 2021-09-30: qty 2

## 2021-09-30 MED ORDER — TAMSULOSIN HCL 0.4 MG PO CAPS
0.4000 mg | ORAL_CAPSULE | Freq: Every day | ORAL | 0 refills | Status: AC
Start: 2021-09-30 — End: 2021-10-07

## 2021-09-30 MED ORDER — SODIUM CHLORIDE 0.9 % IV BOLUS
1000.0000 mL | Freq: Once | INTRAVENOUS | Status: AC
Start: 2021-09-30 — End: 2021-09-30
  Administered 2021-09-30: 05:00:00 1000 mL via INTRAVENOUS

## 2021-09-30 MED ORDER — HYDROCODONE-ACETAMINOPHEN 5-325 MG PO TABS
1.0000 | ORAL_TABLET | Freq: Four times a day (QID) | ORAL | 0 refills | Status: AC | PRN
Start: 2021-09-30 — End: 2021-10-07

## 2021-09-30 MED ORDER — ONDANSETRON 4 MG PO TBDP
4.0000 mg | ORAL_TABLET | Freq: Four times a day (QID) | ORAL | 0 refills | Status: DC | PRN
Start: 2021-09-30 — End: 2022-03-07

## 2021-09-30 NOTE — ED Triage Notes (Addendum)
[name] is a 65 y.o. male present to the ER c/o R flank pain and R side pain onset yesterday at 16:00 pt notice his urine was dark, pt believe its blood, pt with hx of kidney stones. BP 128/86   Pulse 64   Temp 98.2 F (36.8 C)   Resp 18   Ht 5\' 10"  (1.778 m)   Wt 97.5 kg   SpO2 98%   BMI 30.85 kg/m

## 2021-10-03 NOTE — ED Provider Notes (Signed)
EMERGENCY DEPARTMENT HISTORY AND PHYSICAL EXAM     None        Date: 09/30/2021  Patient Name: [name]    History of Presenting Illness     Chief Complaint   Patient presents with    Flank Pain         Additional History: [name] is a 65 y.o. male presenting to the ED with intermittent right flank pain, mild. Hx kidney stones. No fever, chills. No n/v. No meds taken prior to arrival. Noticed hematuria when he got home from work which worried him. States he has never had hematuria with prior stones. Does not have a urologist.       PCP: Steven Jabs, MD  SPECIALISTS:    No current facility-administered medications for this encounter.     Current Outpatient Medications   Medication Sig Dispense Refill    fenofibrate (TRICOR) 145 MG tablet TAKE 1 TABLET BY MOUTH EVERY DAY 90 tablet 1    HYDROcodone-acetaminophen (NORCO) 5-325 MG per tablet Take 1 tablet by mouth every 6 (six) hours as needed for Pain 12 tablet 0    loperamide (IMODIUM A-D) 2 MG tablet Take 1 tablet (2 mg total) by mouth 4 (four) times daily as needed for Diarrhea 40 tablet 3    Multiple Vitamins-Minerals (MULTIVITAMIN WITH MINERALS) tablet Take 1 tablet by mouth daily      ondansetron (ZOFRAN-ODT) 4 MG disintegrating tablet Take 1 tablet (4 mg total) by mouth every 6 (six) hours as needed for Nausea 8 tablet 0    rosuvastatin (CRESTOR) 40 MG tablet Take 1 tablet (40 mg total) by mouth daily 90 tablet 0    sertraline (ZOLOFT) 50 MG tablet Take 1 tablet (50 mg total) by mouth daily 90 tablet 0    tadalafil (CIALIS) 20 MG tablet Take 1 tablet (20 mg total) by mouth daily as needed for Erectile Dysfunction As needed 18 tablet 3    tamsulosin (FLOMAX) 0.4 MG Cap Take 1 capsule (0.4 mg total) by mouth daily for 7 days Discontinue for lightheadedness 7 capsule 0    valACYclovir (VALTREX) 1000 MG tablet Take 1 tablet (1,000 mg total) by mouth 2 (two) times daily 20 tablet 3    valsartan-hydroCHLOROthiazide (DIOVAN-HCT) 320-25 MG  per tablet TAKE 1 TABLET BY MOUTH EVERY DAY 90 tablet 1    vitamin D (CHOLECALCIFEROL) 1000 units tablet Take 5,000 Units by mouth daily      Xarelto 20 MG Tab TAKE 1 TABLET BY MOUTH EVERY DAY WITH DINNER 90 tablet 1       Past History     Past Medical History:  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    x2 last 2018    Functional diarrhea 06/03/2020    H/O lumbar discectomy 08/12/2018    2011     H/O: vasectomy 08/12/2018    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    Hyperplastic colonic polyp 08/12/2018    2017 return in 5 years    Hypertension     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    Mixed hyperlipidemia 08/12/2018    Nephrolithiasis 08/12/2018    x2 last 2018     Prediabetes 09/06/2021    Premature ejaculation 08/12/2018    Preop examination 09/29/2019    Stage 3a  chronic kidney disease 09/09/2020    Throat fullness 08/28/2019       Past Surgical History:  Past Surgical History:   Procedure Laterality Date    BUNIONECTOMY  02/09/2015    COLONOSCOPY  07/2007    VASECTOMY  05/1992       Family History:  Family History   Problem Relation Age of Onset    Stroke Mother        Social History:  Social History     Tobacco Use    Smoking status: Never    Smokeless tobacco: Never   Vaping Use    Vaping Use: Never used   Substance Use Topics    Alcohol use: Yes     Comment: occ    Drug use: No       Allergies:  No Known Allergies    Review of Systems     Review of Systems   Constitutional:  Negative for activity change, chills and fever.   HENT:  Negative for congestion and sore throat.    Eyes:  Negative for pain and redness.   Respiratory:  Negative for cough and shortness of breath.    Cardiovascular:  Negative for chest pain and palpitations.   Gastrointestinal:  Negative for abdominal pain, diarrhea, nausea and vomiting.    Genitourinary:  Positive for flank pain and hematuria. Negative for dysuria and enuresis.   Musculoskeletal:  Negative for arthralgias and myalgias.   Skin:  Negative for pallor and rash.   Neurological:  Negative for light-headedness and headaches.   Psychiatric/Behavioral:  Negative for suicidal ideas. The patient is not nervous/anxious.    All other systems reviewed and are negative.    Physical Exam   BP 129/74    Pulse (!) 59    Temp 98.2 F (36.8 C)    Resp 14    Ht 5\' 10"  (1.778 m)    Wt 97.5 kg    SpO2 98%    BMI 30.85 kg/m     Physical Exam  Vitals and nursing note reviewed.   Constitutional:       General: He is not in acute distress.     Appearance: He is well-developed. He is not diaphoretic.   HENT:      Head: Normocephalic and atraumatic.   Eyes:      Pupils: Pupils are equal, round, and reactive to light.   Cardiovascular:      Rate and Rhythm: Normal rate and regular rhythm.   Pulmonary:      Effort: Pulmonary effort is normal.      Breath sounds: Normal breath sounds.   Abdominal:      General: Bowel sounds are normal. There is no distension.      Palpations: Abdomen is soft. There is no mass.      Tenderness: There is no abdominal tenderness. There is no guarding.   Musculoskeletal:         General: No tenderness or deformity. Normal range of motion.      Cervical back: Normal range of motion and neck supple.   Skin:     General: Skin is warm and dry.      Capillary Refill: Capillary refill takes less than 2 seconds.   Neurological:      Mental Status: He is alert and oriented to person, place, and time.      Cranial Nerves: No cranial nerve deficit.      Coordination: Coordination normal.   Psychiatric:  Behavior: Behavior normal.       Diagnostic Study Results     Labs -     Results       Procedure Component Value Units Date/Time    Comprehensive metabolic panel [[id number]]  (Abnormal) Collected: 09/30/21 0511    Specimen: Blood Updated: 09/30/21 0547     Glucose 104 mg/dL      BUN  10.6 mg/dL      Creatinine 1.7 mg/dL      Sodium 269 mEq/L      Potassium 3.9 mEq/L      Chloride 107 mEq/L      CO2 28 mEq/L      Calcium 9.5 mg/dL      Protein, Total 6.5 g/dL      Albumin 3.8 g/dL      AST (SGOT) 26 U/L      ALT 30 U/L      Alkaline Phosphatase 69 U/L      Bilirubin, Total 0.8 mg/dL      Globulin 2.7 g/dL      Albumin/Globulin Ratio 1.4     Anion Gap 7.0    Lipase [[id number]] Collected: 09/30/21 0511    Specimen: Blood Updated: 09/30/21 0547     Lipase 40 U/L     GFR [[id number]] Collected: 09/30/21 0511     Updated: 09/30/21 0547     EGFR 40.8       Urinalysis Reflex to Microscopic Exam- Reflex to Culture [[id number]]  (Abnormal) Collected: 09/30/21 0511    Specimen: Urine, Clean Catch Updated: 09/30/21 0539     Urine Type Urine, Clean Ca     Color, UA Yellow     Clarity, UA Hazy     Specific Gravity UA 1.015     Urine pH 6.0     Leukocyte Esterase, UA Negative     Nitrite, UA Negative     Protein, UR 30     Glucose, UA Negative     Ketones UA Negative     Urobilinogen, UA Negative mg/dL      Bilirubin, UA Negative     Blood, UA Large     RBC, UA TNTC /hpf      WBC, UA 6 - 10 /hpf     CBC and differential [[id number]] Collected: 09/30/21 0511    Specimen: Blood Updated: 09/30/21 0521     WBC 3.68 x10 3/uL      Hgb 15.8 g/dL      Hematocrit 10.2 %      Platelets 156 x10 3/uL      RBC 5.11 x10 6/uL      MCV 90.2 fL      MCH 30.9 pg      MCHC 34.3 g/dL      RDW 12 %      MPV 10.9 fL      Neutrophils 32.2 %      Lymphocytes Automated 49.5 %      Monocytes 15.8 %      Eosinophils Automated 2.2 %      Basophils Automated 0.3 %      Immature Granulocytes 0.0 %      Nucleated RBC 0.0 /100 WBC      Neutrophils Absolute 1.19 x10 3/uL      Lymphocytes Absolute Automated 1.82 x10 3/uL      Monocytes Absolute Automated 0.58 x10 3/uL      Eosinophils Absolute Automated 0.08 x10 3/uL      Basophils Absolute Automated 0.01 x10  3/uL      Immature Granulocytes Absolute 0.00 x10 3/uL      Absolute NRBC 0.00 x10 3/uL              Radiologic Studies -   Radiology Results (24 Hour)       Procedure Component Value Units Date/Time    CT Abd/Pelvis without Contrast [[id number]] Collected: 09/30/21 9811    Order Status: Completed Updated: 09/30/21 0622    Narrative:      CLINICAL HISTORY:  flank pain, hx of kidney stones    EXAMINATION:  CT ABDOMEN PELVIS WO IV/ WO PO CONT    COMPARISON:  10/21/2019.     TECHNIQUE:  contiguous axial CT images of the abdomen and pelvis are obtained.    Coronal and sagittal reconstructions are performed.   A radiation dose optimization technique is used for this scan.    Contrast:  No contrast is used, which decreases the sensitivity of this exam.     FINDINGS:     ABDOMINAL FINDINGS:     Visualized heart: within normal limits.    Visualized lung bases: small amount of atelectasis in the visualized  lung bases.    Liver: 15 mm hypodensity in right lobe is unchanged    Biliary system: within normal limits.    Pancreas: within normal limits.    Stomach: within normal limits.    Spleen: within normal limits.    Adrenal glands: within normal limits.    Kidneys: Likely small bilateral renal cysts, cannot be fully  characterized due to lack of IV contrast. Some in the left kidney are  subcentimeter, hypodense, likely hemorrhagic or proteinaceous cyst.    Nonobstructing bilateral renal stones measuring up to 4 mm.    3 mm obstructing right vesicoureteral junction stone causes mild  hydroureter, hydronephrosis and perinephric stranding.      PELVIC FINDINGS:     Appendix: Normal    Bladder: within normal limits.    Reproductive organs: No acute abnormality      ABDOMINAL AND PELVIC FINDINGS:     Bowel: The remainder of the bowel appears normal.    Mesentery: within normal limits.    Lymph nodes: within normal limits.    Bones: Mild degenerative changes.     Subcutaneous soft tissues: within normal limits.    Vessels: Trace of atherosclerotic calcifications        Impression:           3 mm obstructing right  vesicoureteral junction stone causes mild  hydroureter, hydronephrosis and perinephric stranding    Nonobstructing bilateral renal stones     Nicki Reaper, MD   09/30/2021 6:20 AM        .    Medical Decision Making   I am the first provider for this patient.    I reviewed the vital signs, available nursing notes, past medical history, past surgical history, family history and social history.    Vital Signs-Reviewed the patient's vital signs.   No data found.    Pulse Oximetry Analysis - 98% on RA wnl    Procedures:    Clinical Decision Support:               Provider Notes: right flank pain ,hematuria c/f kidney stone. Less likely pyelonephritis. Will obtain labs, ct, ua  ED Course as of 10/03/21 1610   Sat Sep 30, 2021   9147 IMPRESSION:         3 mm obstructing right vesicoureteral junction  stone causes mild  hydroureter, hydronephrosis and perinephric stranding     Nonobstructing bilateral renal stones  [MA]      ED Course User Index  [MA] Saamiya Jeppsen, Rochel Brome, MD         Diagnosis     Clinical Impression:   1. Right ureteral stone    2. Renal insufficiency        Treatment Plan:   ED Disposition       ED Disposition   Discharge    Condition   --    Date/Time   Sat Sep 30, 2021  6:58 AM    Comment   [name] discharge to home/self care.    Condition at disposition: Stable                        Darcus Pester, MD  10/03/21 1610

## 2021-11-02 ENCOUNTER — Other Ambulatory Visit: Payer: Self-pay | Admitting: Internal Medicine

## 2021-12-06 ENCOUNTER — Ambulatory Visit: Payer: BC Managed Care – PPO | Admitting: Internal Medicine

## 2021-12-06 ENCOUNTER — Encounter: Payer: Self-pay | Admitting: Internal Medicine

## 2021-12-06 VITALS — BP 137/86 | HR 60 | Temp 97.5°F | Ht 70.0 in | Wt 213.0 lb

## 2021-12-06 DIAGNOSIS — R7303 Prediabetes: Secondary | ICD-10-CM

## 2021-12-06 DIAGNOSIS — F330 Major depressive disorder, recurrent, mild: Secondary | ICD-10-CM

## 2021-12-06 DIAGNOSIS — I10 Essential (primary) hypertension: Secondary | ICD-10-CM

## 2021-12-06 DIAGNOSIS — N200 Calculus of kidney: Secondary | ICD-10-CM

## 2021-12-06 DIAGNOSIS — E782 Mixed hyperlipidemia: Secondary | ICD-10-CM

## 2021-12-06 DIAGNOSIS — N1831 Chronic kidney disease, stage 3a: Secondary | ICD-10-CM

## 2021-12-06 MED ORDER — SERTRALINE HCL 50 MG PO TABS
50.0000 mg | ORAL_TABLET | Freq: Every day | ORAL | 0 refills | Status: DC
Start: 2021-12-06 — End: 2022-04-02

## 2021-12-06 MED ORDER — ROSUVASTATIN CALCIUM 40 MG PO TABS
40.0000 mg | ORAL_TABLET | Freq: Every day | ORAL | 0 refills | Status: DC
Start: 2021-12-06 — End: 2022-04-02

## 2021-12-06 NOTE — Progress Notes (Signed)
Date Time: 12/06/2021 9:51 AM  Patient Name: [name], [name]   DOB: November 03, 1956    Subjective:   [name] is a 65 y.o. male who presents for a follow-up visit    In December 2020 he had a tooth problem and extraction and he was given antibiotics for about a week  Since then he has been having daily 4-5 times diarrhea without blood or mucus but he has some tenesmus he said  I gave him a course of Flagyl this did not help he went to gastroenterology and he had a colonoscopy they found 1 polyp which was not malignant but told him to come back in 10 years  History has intermittent diarrhea and they gave him loperamide    His blood work shows that his cholesterol is high and his blood pressure is still high as well    06/03/2020  He feels very good in himself he has no breathing troubles or chest pains no bowel or urinary problems his medications were reviewed with him he has tolerated the higher dose of the valsartan and its time to check his kidney function    09/09/2020  The gastroenterologist gave him loperamide for his intermittent loose stools and he gave him Metamucil which she has not taken  When he drives the toes in his right foot become a little bit numb and sometimes painful he has to shake his foot that everything is okay he has had DVT in that leg and he was worried  He has been taking his fenofibrate along with rosuvastatin and its time to recheck  He has not been taking Zoloft for his premature ejaculation on a routine basis only as needed which I told him of the wrong way    01/31/2021  He feels very good in himself has no breathing troubles chest pain bowel or urinary problems  However he complains of tingling and numbness in his foot when he is driving less so otherwise    06/02/2021  He does not have a refill on his Valtrex and unfortunately he has an outbreak and he did not have the medicines  He is trying to exercise and lose weight he is not very successful unfortunately  No changes to  the rest of his medications no breathing trouble chest pain no bowel or urinary problems  Medications reviewed and reconciled    09/06/2021  He did see the pain management doctors we did a CAT scan and 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 Loma Newton who referred him to Dr. Doreatha Martin who is a vascular surgeon and  He has no PND orthopnea chest pain or claudication    12/06/2021  He had to go to the emergency room the other day they did find another stone in the ureter he is known to have bilateral kidney stones anyways  He complains of lower buttock pain when he wakes up in the morning he wants something done about that he had had lumbar spine surgery in the past discectomy in 2011  Medications were reviewed and reconciled    Past Medical History:     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  08/12/2018    x2 last 2018    Functional diarrhea 06/03/2020    H/O lumbar discectomy 08/12/2018    2011     H/O: vasectomy 08/12/2018    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    Hyperplastic colonic polyp 08/12/2018    2017 return in 5 years    Hypertension     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    Mixed hyperlipidemia 08/12/2018    Nephrolithiasis 08/12/2018    x2 last 2018     Prediabetes 09/06/2021    Premature ejaculation 08/12/2018    Preop examination 09/29/2019    Stage 3a chronic kidney disease 09/09/2020    Throat fullness 08/28/2019       Past Surgical History:     Past Surgical History:   Procedure Laterality Date    BUNIONECTOMY  02/09/2015    COLONOSCOPY  07/2007    VASECTOMY  05/1992       Family History:     Family History   Problem Relation Age of Onset    Stroke Mother        Social History:     Social History     Socioeconomic  History    Marital status: Divorced     Spouse name: None    Number of children: None    Years of education: None    Highest education level: None   Occupational History    None   Tobacco Use    Smoking status: Never    Smokeless tobacco: Never   Vaping Use    Vaping Use: Never used   Substance and Sexual Activity    Alcohol use: Yes     Comment: occ    Drug use: No    Sexual activity: None   Other Topics Concern    None   Social History Narrative    None     Social Determinants of Health     Financial Resource Strain: Not on file   Food Insecurity: Not on file   Transportation Needs: Not on file   Physical Activity: Not on file   Stress: Not on file   Social Connections: Not on file   Intimate Partner Violence: Not on file   Housing Stability: Not on file       Allergies:   No Known Allergies    Medications:     Current Outpatient Medications   Medication Sig Dispense Refill    fenofibrate (TRICOR) 145 MG tablet TAKE 1 TABLET BY MOUTH EVERY DAY 90 tablet 1    loperamide (IMODIUM A-D) 2 MG tablet Take 1 tablet (2 mg total) by mouth 4 (four) times daily as needed for Diarrhea 40 tablet 3    Multiple Vitamins-Minerals (MULTIVITAMIN WITH MINERALS) tablet Take 1 tablet by mouth daily      ondansetron (ZOFRAN-ODT) 4 MG disintegrating tablet Take 1 tablet (4 mg total) by mouth every 6 (six) hours as needed for Nausea 8 tablet 0    rosuvastatin (CRESTOR) 40 MG tablet Take 1 tablet (40 mg) by mouth daily 90 tablet 0    sertraline (ZOLOFT) 50 MG tablet Take 1 tablet (50 mg) by mouth daily 90 tablet 0    tadalafil (CIALIS) 20 MG tablet Take 1 tablet (20 mg total) by mouth daily as needed for Erectile Dysfunction As needed 18 tablet 3    valACYclovir (VALTREX) 1000 MG tablet Take 1 tablet (1,000 mg total) by mouth 2 (two) times daily 20 tablet 3  valsartan-hydroCHLOROthiazide (DIOVAN-HCT) 320-25 MG per tablet TAKE 1 TABLET BY MOUTH EVERY DAY 90 tablet 1    vitamin D (CHOLECALCIFEROL) 1000 units tablet Take 5,000 Units by  mouth daily      Xarelto 20 MG Tab TAKE 1 TABLET BY MOUTH EVERY DAY WITH DINNER 90 tablet 1     No current facility-administered medications for this visit.       Review of Systems:   A comprehensive review of systems was:   General ROS:   Respiratory ROS: no cough, shortness of breath, or wheezing  Cardiovascular ROS: no chest pain or dyspnea on exertion  Gastrointestinal ROS: occasional intermittent diarrhea  Genito-Urinary ROS: no dysuria, trouble voiding, or hematuria. Recurrent herpes simplex attacks  Musculoskeletal ROS: Bilateral lower buttock pain   neurological ROS: Intermittent numbness of the right toes while driving  Dermatologic ROS: negative for rash    Physical Exam:     Vitals:    12/06/21 0922   BP: 137/86   Pulse: 60   Temp: 97.5 F (36.4 C)   SpO2: 99%     BP Readings from Last 3 Encounters:   12/06/21 137/86   09/30/21 129/74   09/06/21 132/82     Wt Readings from Last 3 Encounters:   12/06/21 96.6 kg (213 lb)   09/30/21 97.5 kg (215 lb)   09/06/21 96.7 kg (213 lb 3.2 oz)   Body mass index is 30.56 kg/m.    General appearance - alert, well appearing, and in no distress  Chest - clear to auscultation, no wheezes, rales or rhonchi, symmetric air entry  Heart - normal rate, regular rhythm, normal S1, S2, no murmurs, rubs, clicks or gallops  Abdomen - soft, nontender, nondistended, no masses or organomegaly  Neurological - alert, oriented, normal speech, no focal findings or movement disorder noted  Extremities - peripheral pulses normal,  no clubbing or cyanosis   trace edema and vertical dermatitis around the right ankle    Labs:     Recent Results (from the past 2016 hour(s))   Urinalysis Reflex to Microscopic Exam- Reflex to Culture    Collection Time: 09/30/21  5:11 AM    Specimen: Urine, Clean Catch   Result Value Ref Range    Urine Type Urine, Clean Ca     Color, UA Yellow Colorless - Yellow    Clarity, UA Hazy Clear - Hazy    Specific Gravity UA 1.015 1.001 - 1.035    Urine pH 6.0 5.0 - 8.0     Leukocyte Esterase, UA Negative Negative    Nitrite, UA Negative Negative    Protein, UR 30 (A) Negative    Glucose, UA Negative Negative    Ketones UA Negative Negative    Urobilinogen, UA Negative 0.2 - 2.0 mg/dL    Bilirubin, UA Negative Negative    Blood, UA Large (A) Negative    RBC, UA TNTC (A) 0 - 5 /hpf    WBC, UA 6 - 10 (A) 0 - 5 /hpf   CBC and differential    Collection Time: 09/30/21  5:11 AM   Result Value Ref Range    WBC 3.68 3.10 - 9.50 x10 3/uL    Hgb 15.8 12.5 - 17.1 g/dL    Hematocrit 16.1 09.6 - 49.6 %    Platelets 156 142 - 346 x10 3/uL    RBC 5.11 4.20 - 5.90 x10 6/uL    MCV 90.2 78.0 - 96.0 fL    MCH 30.9 25.1 -  33.5 pg    MCHC 34.3 31.5 - 35.8 g/dL    RDW 12 11 - 15 %    MPV 10.9 8.9 - 12.5 fL    Neutrophils 32.2 None %    Lymphocytes Automated 49.5 None %    Monocytes 15.8 None %    Eosinophils Automated 2.2 None %    Basophils Automated 0.3 None %    Immature Granulocytes 0.0 None %    Nucleated RBC 0.0 0.0 - 0.0 /100 WBC    Neutrophils Absolute 1.19 1.10 - 6.33 x10 3/uL    Lymphocytes Absolute Automated 1.82 0.42 - 3.22 x10 3/uL    Monocytes Absolute Automated 0.58 0.21 - 0.85 x10 3/uL    Eosinophils Absolute Automated 0.08 0.00 - 0.44 x10 3/uL    Basophils Absolute Automated 0.01 0.00 - 0.08 x10 3/uL    Immature Granulocytes Absolute 0.00 0.00 - 0.07 x10 3/uL    Absolute NRBC 0.00 0.00 - 0.00 x10 3/uL   Comprehensive metabolic panel    Collection Time: 09/30/21  5:11 AM   Result Value Ref Range    Glucose 104 (H) 70 - 100 mg/dL    BUN 16.1 9.0 - 09.6 mg/dL    Creatinine 1.7 (H) 0.5 - 1.5 mg/dL    Sodium 045 409 - 811 mEq/L    Potassium 3.9 3.5 - 5.3 mEq/L    Chloride 107 99 - 111 mEq/L    CO2 28 17 - 29 mEq/L    Calcium 9.5 8.5 - 10.5 mg/dL    Protein, Total 6.5 6.0 - 8.3 g/dL    Albumin 3.8 3.5 - 5.0 g/dL    AST (SGOT) 26 5 - 41 U/L    ALT 30 0 - 55 U/L    Alkaline Phosphatase 69 37 - 117 U/L    Bilirubin, Total 0.8 0.2 - 1.2 mg/dL    Globulin 2.7 2.0 - 3.6 g/dL    Albumin/Globulin  Ratio 1.4 0.9 - 2.2    Anion Gap 7.0 5.0 - 15.0   Lipase    Collection Time: 09/30/21  5:11 AM   Result Value Ref Range    Lipase 40 8 - 78 U/L   GFR    Collection Time: 09/30/21  5:11 AM   Result Value Ref Range    EGFR 40.8         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    Hypertension    H/O lumbar discectomy    Bilateral nephrolithiasis    Mixed hyperlipidemia    Premature ejaculation    Herpes simplex infection of penis    LAFB (left anterior fascicular block)    Throat fullness    COVID-19 virus infection    Adenomatous polyp of colon    Preop examination    Functional diarrhea    Major depressive disorder, recurrent episode, mild    Major depressive disorder, recurrent episode, moderate    Recurrent major depression    Stage 3a chronic kidney disease    Prediabetes       Orders Placed This Encounter   Procedures    Lipid panel     Order Specific Question:   Has the patient fasted?     Answer:   Yes     Order Specific Question:   Release to patient  Answer:   Immediate    Ambulatory referral to Nephrology     Referral Priority:   Routine     Referral Type:   Consultation     Referral Reason:   Specialty Services Required     Referred to Provider:   Jannett Celestine, MD     Requested Specialty:   Nephrology     Number of Visits Requested:   1    Ambulatory referral to Neurology     Referral Priority:   Routine     Referral Type:   Consultation     Referral Reason:   Specialty Services Required     Referred to Provider:   Charlynn Court, MD     Requested Specialty:   Neurology     Number of Visits Requested:   1         1. 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 Hyperlipidemia Not well controlled he is supposed to be on Crestor 40 and TriCor however he does not have Crestor for quite  a while therefore recheck today    3.  Recurrent genital herpes about once a year and he gets Valtrex for 5 days new prescription was given    4.  Hypertension not well-controlled because of poor compliance I admonished his behavior  Valsartan was increased last time to 320/25 and the levels are much better now    5.  History of premature ejaculation and he uses Zoloft for that, And Viagra for erectile dysfunction  He was taking Zoloft as needed I told him he needs to take it every day    6.  History of colonic polyps last colonoscopy was in 2017 and repeat colonoscopy in 01/2020 if on only 1 polyp nonmalignant told him to come back in 10 years    7.  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  History of kidney stones twice that he can remember drinking plenty of fluids    9. Erectile dysfunction and he Wanted an increase in the dose of Cialis therefore we gave him 20 mg    10.  Sensation of fullness in the throat status post steroid treatment by ENT however no improvement he was supposed to get a panendoscopy by Dr. Harrold Donath we did not discuss this today    11.  Diarrhea 4-5 times after antibiotic treatment for tooth extraction Flagyl that I gave him did not work he saw gastroenterology they did a colonoscopy that only found 1 polyp him back in 10 years  However he tells me he gets watery diarrhea still intermittently I therefore advised him to go back and talk to the gastroenterologist, which he did and they gave him loperamide and Metamucil strange enough I told him if he is not happy he should find another gastroenterologist    12.  CKD stage III 8 so far he is quite stable avoid nephrotoxins make sure his blood pressure is well controlled.  I would send him for consultation with Dr. Lauralee Evener from nephrology    13.  Longstanding history of tingling and numbness in his right foot when he is driving less so otherwise I have referred him to neurology for nerve conduction studies    14.  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 Loma Newton who is referring him to Dr. Doreatha Martin from vascular surgery    15.  Bilateral lower buttock pain in a person who had a history of lumbar discectomy in 2011 this happens in the mornings he wanted something done I would send him to neurology Dr. Raphael Gibney    16.  Bilateral nephrolithiasis with intermittent ureteral stones the last 1 was last month November/2022 we deferred referral to urology at this moment in time    He received his COVID-19 immunization  He also received his flu vaccine and shingles vaccine    RTC in 3 months       This note was generated by the Epic EMR system/Speech recognition and may contain inherent errors or omissions not intended by the user. Grammatical errors, random word insertions, deletions and pronoun errors  are occasional consequences of this technology due to software limitations.   Not all errors are caught or corrected. If there are questions or concerns about the content of this note or information contained within the body of this dictation they should be addressed directly with the author for clarification.      Signed by: Nyra Jabs, MD

## 2021-12-07 LAB — LIPID PANEL
Cholesterol / HDL Ratio: 3.7 ratio (ref 0.0–5.0)
Cholesterol: 268 mg/dL — ABNORMAL HIGH (ref 100–199)
HDL: 73 mg/dL (ref >39)
LDL Chol Calculated (NIH): 185 mg/dL — ABNORMAL HIGH (ref 0–99)
Triglycerides: 65 mg/dL (ref 0–149)
VLDL Calculated: 10 mg/dL (ref 5–40)

## 2021-12-08 ENCOUNTER — Ambulatory Visit (HOSPITAL_BASED_OUTPATIENT_CLINIC_OR_DEPARTMENT_OTHER): Payer: BC Managed Care – PPO | Admitting: Neurology

## 2021-12-08 ENCOUNTER — Encounter (HOSPITAL_BASED_OUTPATIENT_CLINIC_OR_DEPARTMENT_OTHER): Payer: Self-pay

## 2021-12-08 ENCOUNTER — Encounter (HOSPITAL_BASED_OUTPATIENT_CLINIC_OR_DEPARTMENT_OTHER): Payer: Self-pay | Admitting: Neurology

## 2021-12-08 VITALS — BP 132/87 | HR 51 | Temp 98.2°F | Resp 16 | Ht 70.0 in | Wt 213.0 lb

## 2021-12-08 DIAGNOSIS — M545 Low back pain, unspecified: Secondary | ICD-10-CM

## 2021-12-08 DIAGNOSIS — G8929 Other chronic pain: Secondary | ICD-10-CM

## 2021-12-08 NOTE — Progress Notes (Signed)
Subjective:      Patient ID: [name] is a 65 y.o. male.    HPI    The patient is a pleasant 65 years old right-handed male who is here for evaluation of bilateral hip pain for the last 3 months.  The patient reports that the pain is mainly in the back, buttocks that seems to shoot down the back of the upper thigh.  It is unpredictable will, may occur without any trigger on waking up maybe 2-3 times a week and may last for few hours.  He also feels tingling and numbness in the toes especially while lying down or while driving the car, otherwise no other paresthesias.  His partner has brought some local ointment that seems to help with the pain, he has not tried taking any Motrin Advil or any other pain medication.  He denies having any weakness in the leg, he does have history of lumbar spine surgery many years ago, he is worried whether he has gotten another herniated disc that may be causing the symptoms.  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  He denies having any trauma, injury or any fall that may have brought on the symptoms.    No Known Allergies    The following portions of the patient's history were reviewed and updated as appropriate: allergies, current medications, past family history, past medical history, past social history, past surgical history, and problem list.    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    x2 last 2018    Functional diarrhea 06/03/2020    H/O lumbar discectomy 08/12/2018    2011     H/O: vasectomy 08/12/2018    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    Hyperplastic colonic polyp 08/12/2018    2017 return in 5 years    Hypertension     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    Mixed hyperlipidemia 08/12/2018    Nephrolithiasis 08/12/2018    x2 last 2018     Prediabetes  09/06/2021    Premature ejaculation 08/12/2018    Preop examination 09/29/2019    Stage 3a chronic kidney disease 09/09/2020    Throat fullness 08/28/2019     Current Outpatient Medications on File Prior to Visit   Medication Sig Dispense Refill    fenofibrate (TRICOR) 145 MG tablet TAKE 1 TABLET BY MOUTH EVERY DAY 90 tablet 1    loperamide (IMODIUM A-D) 2 MG tablet Take 1 tablet (2 mg total) by mouth 4 (four) times daily as needed for Diarrhea 40 tablet 3    Multiple Vitamins-Minerals (MULTIVITAMIN WITH MINERALS) tablet Take 1 tablet by mouth daily      rosuvastatin (CRESTOR) 40 MG tablet Take 1 tablet (40 mg) by mouth daily 90 tablet 0    sertraline (ZOLOFT) 50 MG tablet Take 1 tablet (50 mg) by mouth daily 90 tablet 0    tadalafil (CIALIS) 20 MG tablet Take 1 tablet (20 mg total) by mouth daily as needed for Erectile Dysfunction As needed 18 tablet 3    valACYclovir (VALTREX) 1000 MG tablet Take 1 tablet (1,000 mg total) by mouth 2 (two) times daily 20 tablet 3    valsartan-hydroCHLOROthiazide (DIOVAN-HCT) 320-25 MG per tablet TAKE 1 TABLET BY MOUTH EVERY DAY 90 tablet 1  vitamin D (CHOLECALCIFEROL) 1000 units tablet Take 5,000 Units by mouth daily      Xarelto 20 MG Tab TAKE 1 TABLET BY MOUTH EVERY DAY WITH DINNER 90 tablet 1    ondansetron (ZOFRAN-ODT) 4 MG disintegrating tablet Take 1 tablet (4 mg total) by mouth every 6 (six) hours as needed for Nausea 8 tablet 0     No current facility-administered medications on file prior to visit.     Review of Systems   Constitutional:  Positive for activity change and fatigue.   HENT: Negative.     Eyes: Negative.    Respiratory: Negative.     Cardiovascular: Negative.    Gastrointestinal: Negative.    Endocrine: Negative.    Genitourinary: Negative.    Musculoskeletal:  Positive for gait problem.   Skin: Negative.    Allergic/Immunologic: Negative.    Neurological:  Positive for numbness. Negative for weakness.   Hematological: Negative.    Psychiatric/Behavioral:   Positive for decreased concentration and sleep disturbance. The patient is nervous/anxious.        Family History   Problem Relation Age of Onset    Stroke Mother      Objective:     Vitals:    12/08/21 1048 12/08/21 1050   BP: (!) 142/93 132/87   BP Site: Right arm Left arm   Patient Position: Sitting Sitting   Cuff Size: Medium Medium   Pulse: (!) 56 (!) 51   Resp: 16    Temp: 98.2 F (36.8 C)    TempSrc: Temporal    SpO2: 95%    Weight: 96.6 kg (213 lb)    Height: 1.778 m (5\' 10" )      Neurologic Exam    Constitutional: Vital signs reviewed.   Head: Normocephalic, atraumatic, neck supple no JVD  Eyes: No conjunctival injection. No discharge.   ENT: Mucous membranes moist   Neck: Normal range of motion. Non tender.   Respiratory/Chest: Clear to auscultation. No respiratory distress.   Cardiovascular: Regular rate and rhythm. No murmur.   Lower Extremity: No edema. No cyanosis.   Skin: Warm and dry. No rash.   Lymphatic: No cervical lymphadenopathy.   Psychiatric: Normal affect.     Mental Status: The patient was awake, alert, and oriented. Conversation   was appropriate. Speech was fluent, and the patient followed commands   consistently.  Cranial Nerves: Pupils were equally round and reactive to light. Extraocular  movements were intact, without nystagmus. The patient's face was symmetric,   and facial sensation was intact. Tongue and palate were midline.   Motor Exam: The patient had full strength throughout. Tone and bulk appeared   normal. There were no abnormal movements or tremor noted.   Sensation: Decreased light touch, pinprick and vibration in the lower extremities distally  Coordination: Finger-to-nose and heel-to-shin testing was intact   without dysmetria. Romberg testing was negative.   Gait: Normal, with normal tandem walking.   Deep Tendon Reflexes: 2+ and symmetric throughout.   Toes were downgoing.    Assessment:     65 years old male presenting with bilateral hip pain with mild radiation to the  back of the thighs, history of lumbar discectomy and sciatica  History of DVT on anticoagulation  Stress/anxiety and depression  Chronic kidney disease  Prediabetes      Plan:     The option of symptomatic treatment with one of the neuropathy medication was discussed but he would like to hold it for now as  he is more interested in getting the etiology of his pain, will get MRI of the lumbar spine as well as the pelvis as the pain seems to be more in the buttocks shooting down the back of the thighs.  He will continue using the topical ointment that has helped  He was recommended to avoid lifting or shifting anything heavy that may worsen the symptoms over time  Follow-up in 4 to 6 weeks after the MRI    The patient will return in follow-up as outlined above. If there are any new neurologic symptoms or worsening of current symptoms, the patient is instructed to contact us by telephone.    Lynann Bologna, MD  Neurology, Neurophysiology  [facility]

## 2021-12-18 ENCOUNTER — Other Ambulatory Visit: Payer: Self-pay | Admitting: Internal Medicine

## 2021-12-26 ENCOUNTER — Other Ambulatory Visit: Payer: Self-pay | Admitting: Internal Medicine

## 2022-01-05 ENCOUNTER — Ambulatory Visit: Payer: BC Managed Care – PPO

## 2022-01-05 ENCOUNTER — Ambulatory Visit
Admission: RE | Admit: 2022-01-05 | Discharge: 2022-01-05 | Disposition: A | Discharge status: Home / Self Care | Payer: BC Managed Care – PPO | Source: Ambulatory Visit | Attending: Neurology | Admitting: Neurology

## 2022-01-05 DIAGNOSIS — G8929 Other chronic pain: Secondary | ICD-10-CM | POA: Insufficient documentation

## 2022-01-05 DIAGNOSIS — Q7649 Other congenital malformations of spine, not associated with scoliosis: Secondary | ICD-10-CM | POA: Insufficient documentation

## 2022-01-05 DIAGNOSIS — M5136 Other intervertebral disc degeneration, lumbar region: Secondary | ICD-10-CM | POA: Insufficient documentation

## 2022-01-05 DIAGNOSIS — M545 Low back pain, unspecified: Secondary | ICD-10-CM | POA: Insufficient documentation

## 2022-01-05 DIAGNOSIS — M6788 Other specified disorders of synovium and tendon, other site: Secondary | ICD-10-CM | POA: Insufficient documentation

## 2022-01-16 ENCOUNTER — Telehealth (HOSPITAL_BASED_OUTPATIENT_CLINIC_OR_DEPARTMENT_OTHER): Payer: Self-pay

## 2022-01-16 NOTE — Telephone Encounter (Signed)
Received call from pt requesting F/U on Recent MRI and make appointment. Transferred to scheduling for next appointment. Please advise. Thank you

## 2022-01-16 NOTE — Telephone Encounter (Signed)
Received call from Pt. Wanted a follow up

## 2022-01-19 ENCOUNTER — Encounter (HOSPITAL_BASED_OUTPATIENT_CLINIC_OR_DEPARTMENT_OTHER): Payer: Self-pay | Admitting: Family Nurse Practitioner

## 2022-01-19 ENCOUNTER — Telehealth (INDEPENDENT_AMBULATORY_CARE_PROVIDER_SITE_OTHER): Payer: BC Managed Care – PPO | Admitting: Family Nurse Practitioner

## 2022-01-19 VITALS — Ht 70.0 in | Wt 214.0 lb

## 2022-01-19 DIAGNOSIS — M7918 Myalgia, other site: Secondary | ICD-10-CM

## 2022-01-19 DIAGNOSIS — G8929 Other chronic pain: Secondary | ICD-10-CM

## 2022-01-19 DIAGNOSIS — M545 Low back pain, unspecified: Secondary | ICD-10-CM

## 2022-01-19 NOTE — Progress Notes (Signed)
Subjective:      Patient ID: [name] is a 66 y.o. male.    HPI  The patient is a 66 y.o., male, who presents via video teleconference for follow-up for bilateral hip/buttock pain.  Verbal consent for video telemedicine was obtained.    Patient was initially seen on 12/08/2021 by Dr. Lynann Bologna.    Denied lumbar spine MRI by insurance, approved Pelvic MRI.  Bilateral hip pain started around September 2022.  Overall less pain, but worse with certain movements.   Laying in certain positions exacerbates pain.   Toes on right foot go numb, very painful, especially when driving - intense. Had been going on prior to September.  Mainly pain at base of buttocks.  Better with movement.  Feels ok when goes to bed, feels intense pain upon waking. Uses topical oil, which helps most of the day.   Of note, had lumber disectomy in 2011.      From initial visit:  The patient is a pleasant 66 years old right-handed male who is here for evaluation of bilateral hip pain for the last 3 months.  The patient reports that the pain is mainly in the back, buttocks that seems to shoot down the back of the upper thigh.  It is unpredictable will, may occur without any trigger on waking up maybe 2-3 times a week and may last for few hours.  He also feels tingling and numbness in the toes especially while lying down or while driving the car, otherwise no other paresthesias.  His partner has brought some local ointment that seems to help with the pain, he has not tried taking any Motrin Advil or any other pain medication.  He denies having any weakness in the leg, he does have history of lumbar spine surgery many years ago, he is worried whether he has gotten another herniated disc that may be causing the symptoms.  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  He denies having any trauma, injury or any fall that may have brought on the symptoms.    The following portions of the patient's history were  reviewed and updated as appropriate: allergies, current medications, past family history, past medical history, past social history, past surgical history, and problem list.    Review of Systems  As above.  Current Outpatient Medications on File Prior to Visit   Medication Sig Dispense Refill    fenofibrate (TRICOR) 145 MG tablet TAKE 1 TABLET BY MOUTH EVERY DAY 90 tablet 1    loperamide (IMODIUM A-D) 2 MG tablet Take 1 tablet (2 mg total) by mouth 4 (four) times daily as needed for Diarrhea 40 tablet 3    Multiple Vitamins-Minerals (MULTIVITAMIN WITH MINERALS) tablet Take 1 tablet by mouth daily      ondansetron (ZOFRAN-ODT) 4 MG disintegrating tablet Take 1 tablet (4 mg total) by mouth every 6 (six) hours as needed for Nausea 8 tablet 0    rosuvastatin (CRESTOR) 40 MG tablet Take 1 tablet (40 mg) by mouth daily 90 tablet 0    sertraline (ZOLOFT) 50 MG tablet Take 1 tablet (50 mg) by mouth daily 90 tablet 0    tadalafil (CIALIS) 20 MG tablet Take 1 tablet (20 mg total) by mouth daily as needed for Erectile Dysfunction As needed 18 tablet 3    valACYclovir (VALTREX) 1000 MG tablet Take 1 tablet (1,000 mg total) by mouth 2 (two) times daily 20 tablet 3    valsartan-hydroCHLOROthiazide (DIOVAN-HCT)  320-25 MG per tablet TAKE 1 TABLET BY MOUTH EVERY DAY 90 tablet 1    vitamin D (CHOLECALCIFEROL) 1000 units tablet Take 5,000 Units by mouth daily      Xarelto 20 MG Tab TAKE 1 TABLET BY MOUTH EVERY DAY WITH DINNER 90 tablet 1     No current facility-administered medications on file prior to visit.       Objective:     Vitals:    01/19/22 1420   Weight: 97.1 kg (214 lb)   Height: 1.778 m (5\' 10" )     Body mass index is 30.71 kg/m.    Mental Status: The patient is awake, alert, and oriented. Orlene Erm is appropriate. Speech is fluent, and the patient follows commands consistently.  Cranial Nerves: Extraocular movements are intact. The patient's face is symmetric. Tongue protrudes midline. Shoulder shrug symmetrically  intact.   Motor Exam: The patient moves all four extremities independently against gravity. Tone and bulk appear normal. There are no abnormal movements or tremor noted.   Sensation: Light touch appears intact.   Station and Gait: Normal.      Data Review:    MRI Pelvis WO Contrast - 01/05/2022:    Narrative & Impression   CLINICAL INDICATION:  Bilateral buttock pain     TECHNIQUE: Multiplanar multisequence noncontrast MRI of the pelvis  obtained.     COMPARISON: None available.     INTERPRETATION: No suspicious marrow signal abnormality. No evidence for  acute fracture or AVN. Left-sided hemisacralization of the L5 vertebra.  Disc desiccation in the lower lumbar spine. No hip joint effusions.  Trace fluid in the trochanteric bursa bilaterally. T2 hyperintense  signal surrounds the gluteus minimus tendons bilaterally. Gluteus  minimus, gluteus medius, hamstring, iliopsoas, rectus femoris tendons  intact. No abnormal muscle signal intensity. No soft tissue mass or  fluid collection. Minimal pubic symphysis arthropathy. Visualized  sciatic nerves unremarkable.     IMPRESSION:       1. Bilateral gluteus minimus tendinosis.  2. 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           Mitali Bapna, MD   01/10/2022 8:07 AM     Assessment & Plan:       ICD-10-CM    1. Chronic bilateral low back pain without sciatica  M54.50     G89.27         66 year old male with severe pain in lower back/buttocks.  Pain exacerbated by certain movements and relieved by topical oils.  Reviewed MRI with patient. MRI pelvis showed bilateral gluteus minimus tendinosis.  MRI of the lumbar spine was denied.  Patient has a history of lumbar surgery in 2011. Patient declines treatment with steroids.  Pain has improved over time.  Possible pain is due to overuse injury.    - Minimize strain on lower back, buttocks muscles and surrounding tendons.  - Ordered PT for low back pain for patient to  consider.  - Continue to use topical oils.   - Monitor for improvement in pain.  - Ok to hold off on MRI lumbar spine.    The patient should return for follow-up in 2-3 months or earlier, if needed.  Time spent in video teleconference with patient: 18 minutes.    Darrick Grinder, MSN, FNP-C  [facility] - [facility] Gip  [phone]    Dr. Chales Abrahams was available in a supervisory capacity.      This note was  generated by the [facility] speech recognition program and may contain inherent errors or omissions not intended by the user. Grammatical errors, random word insertions, deletions, pronoun errors and incomplete sentences are occasional consequences of this technology due to software limitations. Not all errors are caught or corrected. If there are questions or concerns about the content of this note or information contained within the body of this dictation they should be addressed directly with the author for clarification.

## 2022-01-23 ENCOUNTER — Encounter (HOSPITAL_BASED_OUTPATIENT_CLINIC_OR_DEPARTMENT_OTHER): Payer: Self-pay

## 2022-02-04 ENCOUNTER — Encounter (HOSPITAL_BASED_OUTPATIENT_CLINIC_OR_DEPARTMENT_OTHER): Payer: Self-pay | Admitting: Family Nurse Practitioner

## 2022-02-22 ENCOUNTER — Other Ambulatory Visit: Payer: Self-pay | Admitting: Internal Medicine

## 2022-03-04 ENCOUNTER — Other Ambulatory Visit: Payer: Self-pay | Admitting: Internal Medicine

## 2022-03-05 NOTE — Telephone Encounter (Signed)
3 months appt

## 2022-03-07 ENCOUNTER — Encounter: Payer: Self-pay | Admitting: Internal Medicine

## 2022-03-07 ENCOUNTER — Ambulatory Visit: Payer: BC Managed Care – PPO | Admitting: Internal Medicine

## 2022-03-07 VITALS — BP 119/80 | HR 60 | Temp 97.3°F | Ht 70.0 in | Wt 215.0 lb

## 2022-03-07 DIAGNOSIS — I10 Essential (primary) hypertension: Secondary | ICD-10-CM

## 2022-03-07 DIAGNOSIS — E782 Mixed hyperlipidemia: Secondary | ICD-10-CM

## 2022-03-07 DIAGNOSIS — Z9889 Other specified postprocedural states: Secondary | ICD-10-CM

## 2022-03-07 DIAGNOSIS — K635 Polyp of colon: Secondary | ICD-10-CM

## 2022-03-07 DIAGNOSIS — K591 Functional diarrhea: Secondary | ICD-10-CM

## 2022-03-07 MED ORDER — TADALAFIL 20 MG PO TABS
20.0000 mg | ORAL_TABLET | Freq: Every day | ORAL | 3 refills | Status: DC | PRN
Start: 2022-03-07 — End: 2022-12-07

## 2022-03-07 NOTE — Progress Notes (Signed)
Date Time: 03/07/2022 9:28 AM  Patient Name: [name], [name]   DOB: Oct 15, 1956    Subjective:   [name] is a 66 y.o. male who presents for a follow-up visit    (Historically)  In December 2020 he had a tooth problem and extraction and he was given antibiotics for about a week  Since then he has been having daily 4-5 times diarrhea without blood or mucus but he has some tenesmus he said  I gave him a course of Flagyl this did not help he went to gastroenterology and he had a colonoscopy they found 1 polyp which was not malignant but told him to come back in 10 years  History has intermittent diarrhea and they gave him loperamide    His blood work shows that his cholesterol is high and his blood pressure is still high as well    06/03/2020  He feels very good in himself he has no breathing troubles or chest pains no bowel or urinary problems his medications were reviewed with him he has tolerated the higher dose of the valsartan and its time to check his kidney function    09/09/2020  The gastroenterologist gave him loperamide for his intermittent loose stools and he gave him Metamucil which she has not taken  When he drives the toes in his right foot become a little bit numb and sometimes painful he has to shake his foot that everything is okay he has had DVT in that leg and he was worried  He has been taking his fenofibrate along with rosuvastatin and its time to recheck  He has not been taking Zoloft for his premature ejaculation on a routine basis only as needed which I told him of the wrong way    01/31/2021  He feels very good in himself has no breathing troubles chest pain bowel or urinary problems  However he complains of tingling and numbness in his foot when he is driving less so otherwise    06/02/2021  He does not have a refill on his Valtrex and unfortunately he has an outbreak and he did not have the medicines  He is trying to exercise and lose weight he is not very successful  unfortunately  No changes to the rest of his medications no breathing trouble chest pain no bowel or urinary problems  Medications reviewed and reconciled    09/06/2021  He did see the pain management doctors we did a CAT scan and 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 Loma Newton who referred him to Dr. Doreatha Martin who is a vascular surgeon and  He has no PND orthopnea chest pain or claudication    12/06/2021  He had to go to the emergency room the other day they did find another stone in the ureter he is known to have bilateral kidney stones anyways  He complains of lower buttock pain when he wakes up in the morning he wants something done about that he had had lumbar spine surgery in the past discectomy in 2011  Medications were reviewed and reconciled    03/07/2022  He did see the nephrologist the urologist and the neurologist  No changes have been made to his medications which were reviewed and reconciled by myself  He continues to complain 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 I told him really there is not much to do unless he really wants Botox injections    Past Medical History:     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    x2 last 2018    Functional diarrhea 06/03/2020    H/O lumbar discectomy 08/12/2018    2011     H/O: vasectomy 08/12/2018    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    Hyperplastic colonic polyp 08/12/2018    2017 return in 5 years    Hypertension     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    Mixed hyperlipidemia 08/12/2018    Nephrolithiasis 08/12/2018    x2 last 2018     Prediabetes 09/06/2021    Premature ejaculation 08/12/2018    Preop examination 09/29/2019    Stage 3a chronic  kidney disease 09/09/2020    Throat fullness 08/28/2019       Past Surgical History:     Past Surgical History:   Procedure Laterality Date    BUNIONECTOMY  02/09/2015    COLONOSCOPY  07/2007    VASECTOMY  05/1992       Family History:     Family History   Problem Relation Age of Onset    Stroke Mother        Social History:     Social History     Socioeconomic History    Marital status: Significant Other     Spouse name: None    Number of children: None    Years of education: None    Highest education level: None   Occupational History    None   Tobacco Use    Smoking status: Never    Smokeless tobacco: Never   Vaping Use    Vaping Use: Never used   Substance and Sexual Activity    Alcohol use: Yes     Alcohol/week: 7.0 standard drinks     Types: 7 Glasses of wine per week     Comment: 1 glass daily    Drug use: No    Sexual activity: None   Other Topics Concern    None   Social History Narrative    None     Social Determinants of Health     Financial Resource Strain: Medium Risk    Difficulty of Paying Living Expenses: Somewhat hard   Food Insecurity: No Food Insecurity    Worried About Programme researcher, broadcasting/film/video in the Last Year: Never true    Ran Out of Food in the Last Year: Never true   Transportation Needs: No Transportation Needs    Lack of Transportation (Medical): No    Lack of Transportation (Non-Medical): No   Physical Activity: Insufficiently Active    Days of Exercise per Week: 3 days    Minutes of Exercise per Session: 30 min   Stress: No Stress Concern Present    Feeling of Stress : Only a little   Social Connections: Socially Isolated    Frequency of Communication with Friends and Family: More than three times a week    Frequency of Social Gatherings with Friends and Family: Never    Attends Religious Services: Never    Database administrator or Organizations: No    Attends Banker Meetings: Not on  file    Marital Status: Divorced   Catering manager Violence: Not At Risk    Fear of Current or  Ex-Partner: No    Emotionally Abused: No    Physically Abused: No    Sexually Abused: No   Housing Stability: Low Risk     Unable to Pay for Housing in the Last Year: No    Number of Places Lived in the Last Year: 1    Unstable Housing in the Last Year: No       Allergies:   No Known Allergies    Medications:     Current Outpatient Medications   Medication Sig Dispense Refill    fenofibrate (TRICOR) 145 MG tablet TAKE 1 TABLET BY MOUTH EVERY DAY 90 tablet 1    Multiple Vitamins-Minerals (MULTIVITAMIN WITH MINERALS) tablet Take 1 tablet by mouth daily      rosuvastatin (CRESTOR) 40 MG tablet Take 1 tablet (40 mg) by mouth daily 90 tablet 0    sertraline (ZOLOFT) 50 MG tablet Take 1 tablet (50 mg) by mouth daily 90 tablet 0    tadalafil (CIALIS) 20 MG tablet Take 1 tablet (20 mg) by mouth daily as needed for Erectile Dysfunction 10 tablet 3    valACYclovir (VALTREX) 1000 MG tablet Take 1 tablet (1,000 mg total) by mouth 2 (two) times daily 20 tablet 3    valsartan-hydroCHLOROthiazide (DIOVAN-HCT) 320-25 MG per tablet TAKE 1 TABLET BY MOUTH EVERY DAY 90 tablet 1    vitamin D (CHOLECALCIFEROL) 1000 units tablet Take 5,000 Units by mouth daily      Xarelto 20 MG Tab TAKE 1 TABLET BY MOUTH EVERY DAY WITH DINNER 90 tablet 1     No current facility-administered medications for this visit.       Review of Systems:   A comprehensive review of systems was:   General ROS:   Respiratory ROS: no cough, shortness of breath, or wheezing  Cardiovascular ROS: no chest pain or dyspnea on exertion  Gastrointestinal ROS: occasional intermittent diarrhea  Genito-Urinary ROS: no dysuria, trouble voiding, or hematuria. Recurrent herpes simplex attacks  Musculoskeletal ROS: Bilateral lower buttock pain   neurological ROS: Intermittent numbness of the right toes while driving  Dermatologic ROS: Chronic bilateral hand intermittent hyperhidrosis    Physical Exam:     Vitals:    03/07/22 0904   BP: 119/80   Pulse: 60   Temp: 97.3 F (36.3 C)    SpO2: 98%     BP Readings from Last 3 Encounters:   03/07/22 119/80   12/08/21 132/87   12/06/21 137/86     Wt Readings from Last 3 Encounters:   03/07/22 97.5 kg (215 lb)   01/19/22 97.1 kg (214 lb)   12/08/21 96.6 kg (213 lb)   Body mass index is 30.85 kg/m.    General appearance - alert, well appearing, and in no distress  Chest - clear to auscultation, no wheezes, rales or rhonchi, symmetric air entry  Heart - normal rate, regular rhythm, normal S1, S2, no murmurs, rubs, clicks or gallops  Abdomen - soft, nontender, nondistended, no masses or organomegaly  Neurological - alert, oriented, normal speech, no focal findings or movement disorder noted  Extremities - peripheral pulses normal,  no clubbing or cyanosis   trace edema and vertical dermatitis around the right ankle    Labs:     No results found for this or any previous visit (from the past 2016 hour(s)).  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    Hypertension    H/O lumbar discectomy    Bilateral nephrolithiasis    Mixed hyperlipidemia    Premature ejaculation    Herpes simplex infection of penis    LAFB (left anterior fascicular block)    Throat fullness    COVID-19 virus infection    Adenomatous polyp of colon    Preop examination    Functional diarrhea    Major depressive disorder, recurrent episode, mild    Major depressive disorder, recurrent episode, moderate    Recurrent major depression    Stage 3a chronic kidney disease    Prediabetes       Orders Placed This Encounter   Procedures    Lipid panel     Order Specific Question:   Has the patient fasted?     Answer:   Yes     Order Specific Question:   Release to patient     Answer:   Immediate         1. 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 Hyperlipidemia Not well controlled he is supposed to be on Crestor 40 and TriCor however he does not have Crestor for quite a while therefore recheck today    3.  Recurrent genital herpes about once a year and he gets Valtrex for 5 days new prescription was given    4.  Hypertension not well-controlled because of poor compliance I admonished his behavior  Valsartan was increased last time to 320/25 and the levels are much better now    5.  History of premature ejaculation and he uses Zoloft for that, And Viagra for erectile dysfunction  He was taking Zoloft as needed I told him he needs to take it every day    6.  History of colonic polyps last colonoscopy was in 2017 and repeat colonoscopy in 01/2020 if on only 1 polyp nonmalignant told him to come back in 10 years    7.  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  History of kidney stones twice that he can remember drinking plenty of fluids.  Follow-up with urology    9. Erectile dysfunction and he Wanted an increase in the dose of Cialis therefore we gave him 20 mg    10.  Sensation of fullness in the throat status post steroid treatment by ENT however no improvement he was supposed to get a panendoscopy by Dr. Harrold DonathNathan we did not discuss this today    11.  Diarrhea 4-5 times after antibiotic treatment for tooth extraction Flagyl that I gave him did not work he saw gastroenterology they did a colonoscopy that only found 1 polyp him back in 10 years  However he tells me he gets watery diarrhea still intermittently I therefore advised him to go back and talk to the gastroenterologist, which he did and they gave him loperamide and Metamucil strange enough I told him if he is not happy he should find another gastroenterologist    12.  CKD stage III 8 so far he is quite stable avoid nephrotoxins make sure his blood pressure is well controlled.  I would send him for consultation with Dr. Lauralee Evener from nephrology    13.  Longstanding  history of tingling and numbness in his right foot when he is driving less so otherwise I have referred him to neurology for nerve conduction studies    14.  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 Loma Newton who is referring him to Dr. Doreatha Martin from vascular surgery    15.  Bilateral lower buttock pain in a person who had a history of lumbar discectomy in 2011 this happens in the mornings he wanted something done I would send him to neurology Dr. Mikey Kirschner refused MRI of the spine but they did MRI of the buttocks which showed gluteus minimus tendinosis and he was referred to physical therapy    16.  Bilateral nephrolithiasis with intermittent ureteral stones the last 1 was last month November/2022 we deferred referral to urology at this moment in time    17.  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 Grammatical errors, random word insertions, deletions and pronoun errors  are occasional consequences of this technology due to software limitations.   Not all errors are caught or corrected. If there are questions or concerns about the content of this note or information contained within the body of this dictation they should be addressed directly with the author for clarification.      Signed by: Nyra Jabs, MD

## 2022-03-08 ENCOUNTER — Encounter: Payer: Self-pay | Admitting: Internal Medicine

## 2022-03-31 ENCOUNTER — Other Ambulatory Visit: Payer: Self-pay | Admitting: Internal Medicine

## 2022-04-10 ENCOUNTER — Other Ambulatory Visit: Payer: Self-pay | Admitting: Urology

## 2022-04-10 DIAGNOSIS — N200 Calculus of kidney: Secondary | ICD-10-CM

## 2022-04-24 ENCOUNTER — Ambulatory Visit
Admission: RE | Admit: 2022-04-24 | Discharge: 2022-04-24 | Disposition: A | Discharge status: Home / Self Care | Payer: BC Managed Care – PPO | Source: Ambulatory Visit | Attending: Urology | Admitting: Urology

## 2022-04-24 DIAGNOSIS — N200 Calculus of kidney: Secondary | ICD-10-CM | POA: Insufficient documentation

## 2022-04-29 ENCOUNTER — Other Ambulatory Visit: Payer: Self-pay | Admitting: Internal Medicine

## 2022-05-31 LAB — LIPID PANEL
Cholesterol / HDL Ratio: 3.3 ratio (ref 0.0–5.0)
Cholesterol: 244 mg/dL — ABNORMAL HIGH (ref 100–199)
HDL: 73 mg/dL (ref >39)
LDL Chol Calculated (NIH): 159 mg/dL — ABNORMAL HIGH (ref 0–99)
Triglycerides: 73 mg/dL (ref 0–149)
VLDL Calculated: 12 mg/dL (ref 5–40)

## 2022-06-06 ENCOUNTER — Encounter: Payer: Self-pay | Admitting: Internal Medicine

## 2022-06-06 ENCOUNTER — Ambulatory Visit: Payer: BC Managed Care – PPO | Admitting: Internal Medicine

## 2022-06-06 VITALS — BP 135/93 | HR 50 | Temp 97.2°F | Ht 70.0 in | Wt 214.3 lb

## 2022-06-06 DIAGNOSIS — R7303 Prediabetes: Secondary | ICD-10-CM

## 2022-06-06 DIAGNOSIS — F331 Major depressive disorder, recurrent, moderate: Secondary | ICD-10-CM

## 2022-06-06 DIAGNOSIS — N200 Calculus of kidney: Secondary | ICD-10-CM

## 2022-06-06 DIAGNOSIS — N1831 Chronic kidney disease, stage 3a: Secondary | ICD-10-CM

## 2022-06-06 DIAGNOSIS — I10 Essential (primary) hypertension: Secondary | ICD-10-CM

## 2022-06-06 MED ORDER — REPATHA SURECLICK 140 MG/ML SC SOAJ
140.0000 mg | SUBCUTANEOUS | 0 refills | Status: DC
Start: 2022-06-06 — End: 2022-09-07

## 2022-06-06 NOTE — Progress Notes (Signed)
Date Time: 06/06/2022 10:00 AM  Patient Name: [name], [name]   DOB: [birth year]    Subjective:   Tymel Conely is a 66 y.o. male who presents for a follow-up visit    (Historically)  In December 2020 he had a tooth problem and extraction and he was given antibiotics for about a week  Since then he has been having daily 4-5 times diarrhea without blood or mucus but he has some tenesmus he said  I gave him a course of Flagyl this did not help he went to gastroenterology and he had a colonoscopy they found 1 polyp which was not malignant but told him to come back in 10 years  History has intermittent diarrhea and they gave him loperamide    His blood work shows that his cholesterol is high and his blood pressure is still high as well    06/03/2020  He feels very good in himself he has no breathing troubles or chest pains no bowel or urinary problems his medications were reviewed with him he has tolerated the higher dose of the valsartan and its time to check his kidney function    09/09/2020  The gastroenterologist gave him loperamide for his intermittent loose stools and he gave him Metamucil which she has not taken  When he drives the toes in his right foot become a little bit numb and sometimes painful he has to shake his foot that everything is okay he has had DVT in that leg and he was worried  He has been taking his fenofibrate along with rosuvastatin and its time to recheck  He has not been taking Zoloft for his premature ejaculation on a routine basis only as needed which I told him of the wrong way    01/31/2021  He feels very good in himself has no breathing troubles chest pain bowel or urinary problems  However he complains of tingling and numbness in his foot when he is driving less so otherwise    06/02/2021  He does not have a refill on his Valtrex and unfortunately he has an outbreak and he did not have the medicines  He is trying to exercise and lose weight he is not very successful  unfortunately  No changes to the rest of his medications no breathing trouble chest pain no bowel or urinary problems  Medications reviewed and reconciled    09/06/2021  He did see the pain management doctors we did a CAT scan and 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 Loma Newton who referred him to Dr. Doreatha Martin who is a vascular surgeon and  He has no PND orthopnea chest pain or claudication    12/06/2021  He had to go to the emergency room the other day they did find another stone in the ureter he is known to have bilateral kidney stones anyways  He complains of lower buttock pain when he wakes up in the morning he wants something done about that he had had lumbar spine surgery in the past discectomy in 2011  Medications were reviewed and reconciled    03/07/2022  He did see the nephrologist the urologist and the neurologist  No changes have been made to his medications which were reviewed and reconciled by myself  He continues to complain 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 I told him really there is not much to do unless he really wants Botox injections    06/06/2022  Unfortunately in spite of Crestor and fenofibrate his cholesterol is still at 168 LDL  No breathing troubles or chest pains no bowel or urinary problems he has no other complaints    Past Medical History:     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    x2 last 2018    Functional diarrhea 06/03/2020    H/O lumbar discectomy 08/12/2018    2011     H/O: vasectomy 08/12/2018    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    Hyperplastic colonic polyp 08/12/2018    2017 return in 5 years    Hypertension     LAFB (left anterior fascicular block)  08/12/2018    Mixed hyperlipidemia 08/12/2018    Nephrolithiasis 08/12/2018    x2 last 2018     Prediabetes 09/06/2021    Premature ejaculation 08/12/2018    Preop examination 09/29/2019    Stage 3a chronic kidney disease 09/09/2020    Throat fullness 08/28/2019       Past Surgical History:     Past Surgical History:   Procedure Laterality Date    BUNIONECTOMY  02/09/2015    COLONOSCOPY  07/2007    VASECTOMY  05/1992       Family History:     Family History   Problem Relation Age of Onset    Stroke Mother        Social History:     Social History     Socioeconomic History    Marital status: Significant Other     Spouse name: None    Number of children: None    Years of education: None    Highest education level: None   Occupational History    None   Tobacco Use    Smoking status: Never    Smokeless tobacco: Never   Vaping Use    Vaping status: Never Used   Substance and Sexual Activity    Alcohol use: Yes     Alcohol/week: 7.0 standard drinks of alcohol     Types: 7 Glasses of wine per week     Comment: 1 glass daily    Drug use: No    Sexual activity: None   Other Topics Concern    None   Social History Narrative    None     Social Determinants of Health     Financial Resource Strain: Medium Risk (12/08/2021)    Overall Financial Resource Strain (CARDIA)     Difficulty of Paying Living Expenses: Somewhat hard   Food Insecurity: No Food Insecurity (12/08/2021)    Hunger Vital Sign     Worried About Running Out of Food in the Last Year: Never true     Ran Out of Food in the Last Year: Never true   Transportation Needs: No Transportation Needs (12/08/2021)    PRAPARE - Therapist, art (Medical): No     Lack of Transportation (Non-Medical): No   Physical Activity: Insufficiently Active (12/08/2021)    Exercise Vital Sign     Days of Exercise per Week: 3 days     Minutes of Exercise per Session: 30 min   Stress:  No Stress Concern Present (12/08/2021)    [employer] of Occupational Health -  Occupational Stress Questionnaire     Feeling of Stress : Only a little   Social Connections: Socially Isolated (12/08/2021)    Social Connection and Isolation Panel [NHANES]     Frequency of Communication with Friends and Family: More than three times a week     Frequency of Social Gatherings with Friends and Family: Never     Attends Religious Services: Never     Database administrator or Organizations: No     Attends Engineer, structural: Not on file     Marital Status: Divorced   Intimate Partner Violence: Not At Risk (12/08/2021)    Humiliation, Afraid, Rape, and Kick questionnaire     Fear of Current or Ex-Partner: No     Emotionally Abused: No     Physically Abused: No     Sexually Abused: No   Housing Stability: Low Risk  (12/08/2021)    Housing Stability Vital Sign     Unable to Pay for Housing in the Last Year: No     Number of Places Lived in the Last Year: 1     Unstable Housing in the Last Year: No       Allergies:   No Known Allergies    Medications:     Current Outpatient Medications   Medication Sig Dispense Refill    fenofibrate (TRICOR) 145 MG tablet TAKE 1 TABLET BY MOUTH EVERY DAY 90 tablet 1    Multiple Vitamins-Minerals (MULTIVITAMIN WITH MINERALS) tablet Take 1 tablet by mouth daily      rosuvastatin (CRESTOR) 40 MG tablet TAKE 1 TABLET BY MOUTH EVERY DAY 90 tablet 0    sertraline (ZOLOFT) 50 MG tablet TAKE 1 TABLET BY MOUTH EVERY DAY 90 tablet 0    tadalafil (CIALIS) 20 MG tablet Take 1 tablet (20 mg) by mouth daily as needed for Erectile Dysfunction 10 tablet 3    valACYclovir (VALTREX) 1000 MG tablet Take 1 tablet (1,000 mg total) by mouth 2 (two) times daily 20 tablet 3    valsartan-hydroCHLOROthiazide (DIOVAN-HCT) 320-25 MG per tablet TAKE 1 TABLET BY MOUTH EVERY DAY 90 tablet 1    vitamin D (CHOLECALCIFEROL) 1000 units tablet Take 5 tablets (5,000 Units) by mouth daily      Xarelto 20 MG Tab TAKE 1 TABLET BY MOUTH EVERY DAY WITH DINNER 90 tablet 1    evolocumab (Repatha  SureClick) 140 MG/ML subcutaneous auto-injector Inject 1 mL (140 mg) into the skin every 14 (fourteen) days 6 each 0     No current facility-administered medications for this visit.       Review of Systems:   A comprehensive review of systems was:   General ROS:   Respiratory ROS: no cough, shortness of breath, or wheezing  Cardiovascular ROS: no chest pain or dyspnea on exertion  Gastrointestinal ROS: occasional intermittent diarrhea  Genito-Urinary ROS: no dysuria, trouble voiding, or hematuria. Recurrent herpes simplex attacks  Musculoskeletal ROS: Bilateral lower buttock pain   neurological ROS: Intermittent numbness of the right toes while driving  Dermatologic ROS: Chronic bilateral hand intermittent hyperhidrosis    Physical Exam:     Vitals:    06/06/22 0938   BP: (!) 135/93   Pulse: (!) 50   Temp: 97.2 F (36.2 C)   SpO2: 98%     BP Readings from Last 3 Encounters:   06/06/22 (!) 135/93   03/07/22 119/80  12/08/21 132/87     Wt Readings from Last 3 Encounters:   06/06/22 97.2 kg (214 lb 4.8 oz)   03/07/22 97.5 kg (215 lb)   01/19/22 97.1 kg (214 lb)   Body mass index is 30.75 kg/m.    General appearance - alert, well appearing, and in no distress  Chest - clear to auscultation, no wheezes, rales or rhonchi, symmetric air entry  Heart - normal rate, regular rhythm, normal S1, S2, no murmurs, rubs, clicks or gallops  Abdomen - soft, nontender, nondistended, no masses or organomegaly  Neurological - alert, oriented, normal speech, no focal findings or movement disorder noted  Extremities - peripheral pulses normal,  no clubbing or cyanosis   trace edema and vertical dermatitis around the right ankle    Labs:     Recent Results (from the past 2016 hour(s))   Lipid panel    Collection Time: 05/30/22  8:09 AM   Result Value Ref Range    Cholesterol 244 (H) 100 - 199 mg/dL    Triglycerides 73 0 - 149 mg/dL    HDL 73 >40 mg/dL    VLDL Calculated 12 5 - 40 mg/dL    LDL Chol Calculated (NIH) 981 (H) 0 - 99 mg/dL     Cholesterol / HDL Ratio 3.3 0.0 - 5.0 ratio         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        1. 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 Hyperlipidemia Not well controlled he is supposed to be on Crestor 40 and TriCor however he does not have Crestor for quite a while now that he is back on his medications for 3 months with an LDL of 168 we will discontinue both and start him on Repatha 140 mg every 2 weeks if his insurance accepts    3.  Recurrent genital herpes about once a year and he gets Valtrex for 5 days new prescription was given    4.  Hypertension not well-controlled because of poor compliance I admonished his behavior  Valsartan was increased last time to 320/25 and the levels are much better now    5.  History of premature ejaculation and he uses Zoloft for that, And Viagra for erectile dysfunction  He was taking Zoloft as needed I told him he needs to take it every day    6.  History of colonic polyps last colonoscopy was in 2017 and repeat colonoscopy in 01/2020 if on only  1 polyp nonmalignant told him to come back in 10 years    7.  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   History of kidney stones twice that he can remember drinking plenty of fluids.  Follow-up with urology    9. Erectile dysfunction and he Wanted an increase in the dose of Cialis therefore we gave him 20 mg    10.  Sensation of fullness in the throat status post steroid treatment by ENT however no improvement he was supposed to get a panendoscopy by Dr. Harrold Donath we did not discuss this today    11.  Diarrhea 4-5 times after antibiotic treatment for tooth extraction Flagyl that I gave him did not work he saw gastroenterology they did a colonoscopy that only found 1 polyp him back in 10 years  However he tells me he gets watery diarrhea still intermittently I therefore advised him to go back and talk to the gastroenterologist, which he did and they gave him loperamide and Metamucil strange enough I told him if he is not happy he should find another gastroenterologist    12.  CKD stage III 8 so far he is quite stable avoid nephrotoxins make sure his blood pressure is well controlled.  I would send him for consultation with Dr. Lauralee Evener from nephrology, he continues to follow-up with them and they continue to tell him everything is stable    13.  Longstanding history of tingling and numbness in his right foot when he is driving less so otherwise I have referred him to neurology for nerve conduction studies    14.  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 Loma Newton who is referring him to Dr. Doreatha Martin from vascular surgery.  He does not remember seeing either one of them we will try and get his report    15.  Bilateral lower buttock pain in a person who had a history of lumbar discectomy in 2011 this happens in the mornings he wanted something done I would send him to neurology Dr. Mikey Kirschner refused MRI of the spine but they did MRI of the buttocks which showed gluteus minimus tendinosis and he was referred to physical therapy    16.  Bilateral  nephrolithiasis with intermittent ureteral stones the last 1 was last month November/2022 we deferred referral to urology at this moment in time    17.  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 Grammatical errors, random word insertions, deletions and pronoun errors  are occasional consequences of this technology due to software limitations.   Not all errors are caught or corrected. If there are questions or concerns about the content of this note or information contained within the body of this dictation they should be addressed directly with the author for clarification.      Signed by: Nyra Jabs, MD

## 2022-07-01 ENCOUNTER — Other Ambulatory Visit: Payer: Self-pay | Admitting: Internal Medicine

## 2022-07-27 ENCOUNTER — Other Ambulatory Visit: Payer: Self-pay | Admitting: Internal Medicine

## 2022-07-27 MED ORDER — VALACYCLOVIR HCL 1 G PO TABS
1000.0000 mg | ORAL_TABLET | Freq: Two times a day (BID) | ORAL | 3 refills | Status: DC
Start: 2022-07-27 — End: 2022-07-27

## 2022-07-27 MED ORDER — VALACYCLOVIR HCL 1 G PO TABS
1000.0000 mg | ORAL_TABLET | Freq: Two times a day (BID) | ORAL | 1 refills | Status: DC
Start: 2022-07-27 — End: 2023-06-14

## 2022-08-28 ENCOUNTER — Other Ambulatory Visit: Payer: Self-pay | Admitting: Internal Medicine

## 2022-08-28 NOTE — Telephone Encounter (Signed)
Time for his 3 months follow-up visit

## 2022-08-30 NOTE — Telephone Encounter (Signed)
Scheduled for 09/07/2022

## 2022-09-04 ENCOUNTER — Other Ambulatory Visit: Payer: Self-pay | Admitting: Internal Medicine

## 2022-09-04 DIAGNOSIS — N1831 Chronic kidney disease, stage 3a: Secondary | ICD-10-CM

## 2022-09-05 ENCOUNTER — Other Ambulatory Visit: Payer: Self-pay | Admitting: Internal Medicine

## 2022-09-06 LAB — BASIC METABOLIC PANEL
BUN / Creatinine Ratio: 15 (ref 10–24)
BUN: 19 mg/dL (ref 8–27)
CO2: 25 mmol/L (ref 20–29)
Calcium: 9.9 mg/dL (ref 8.6–10.2)
Chloride: 101 mmol/L (ref 96–106)
Creatinine: 1.26 mg/dL (ref 0.76–1.27)
Glucose: 98 mg/dL (ref 70–99)
Potassium: 4.1 mmol/L (ref 3.5–5.2)
Sodium: 141 mmol/L (ref 134–144)
eGFR: 64 mL/min/{1.73_m2} (ref >59)

## 2022-09-06 LAB — LIPID PANEL
Cholesterol / HDL Ratio: 2.5 ratio (ref 0.0–5.0)
Cholesterol: 187 mg/dL (ref 100–199)
HDL: 76 mg/dL (ref >39)
LDL Chol Calculated (NIH): 96 mg/dL (ref 0–99)
Triglycerides: 84 mg/dL (ref 0–149)
VLDL Calculated: 15 mg/dL (ref 5–40)

## 2022-09-06 LAB — HEMOGLOBIN A1C: Hemoglobin A1C: 5.5 % (ref 4.8–5.6)

## 2022-09-07 ENCOUNTER — Ambulatory Visit: Payer: BC Managed Care – PPO | Admitting: Internal Medicine

## 2022-09-07 ENCOUNTER — Encounter: Payer: Self-pay | Admitting: Internal Medicine

## 2022-09-07 VITALS — BP 130/82 | HR 62 | Temp 97.1°F | Ht 70.0 in | Wt 211.0 lb

## 2022-09-07 DIAGNOSIS — I10 Essential (primary) hypertension: Secondary | ICD-10-CM

## 2022-09-07 DIAGNOSIS — F330 Major depressive disorder, recurrent, mild: Secondary | ICD-10-CM

## 2022-09-07 DIAGNOSIS — E782 Mixed hyperlipidemia: Secondary | ICD-10-CM

## 2022-09-07 MED ORDER — REPATHA SURECLICK 140 MG/ML SC SOAJ
140.0000 mg | SUBCUTANEOUS | 0 refills | Status: DC
Start: 2022-09-07 — End: 2022-11-12

## 2022-09-07 NOTE — Progress Notes (Signed)
Date Time: 09/07/2022 9:32 AM  Patient Name: [name], [name]   DOB: May 22, 1956    Subjective:   Kingstin Heims is a 66 y.o. male who presents for a follow-up visit    (Historically)  In December 2020 he had a tooth problem and extraction and he was given antibiotics for about a week  Since then he has been having daily 4-5 times diarrhea without blood or mucus but he has some tenesmus he said  I gave him a course of Flagyl this did not help he went to gastroenterology and he had a colonoscopy they found 1 polyp which was not malignant but told him to come back in 10 years  History has intermittent diarrhea and they gave him loperamide    His blood work shows that his cholesterol is high and his blood pressure is still high as well    06/03/2020  He feels very good in himself he has no breathing troubles or chest pains no bowel or urinary problems his medications were reviewed with him he has tolerated the higher dose of the valsartan and its time to check his kidney function    09/09/2020  The gastroenterologist gave him loperamide for his intermittent loose stools and he gave him Metamucil which she has not taken  When he drives the toes in his right foot become a little bit numb and sometimes painful he has to shake his foot that everything is okay he has had DVT in that leg and he was worried  He has been taking his fenofibrate along with rosuvastatin and its time to recheck  He has not been taking Zoloft for his premature ejaculation on a routine basis only as needed which I told him of the wrong way    01/31/2021  He feels very good in himself has no breathing troubles chest pain bowel or urinary problems  However he complains of tingling and numbness in his foot when he is driving less so otherwise    06/02/2021  He does not have a refill on his Valtrex and unfortunately he has an outbreak and he did not have the medicines  He is trying to exercise and lose weight he is not very successful  unfortunately  No changes to the rest of his medications no breathing trouble chest pain no bowel or urinary problems  Medications reviewed and reconciled    09/06/2021  He did see the pain management doctors we did a CAT scan and 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 Loma Newton who referred him to Dr. Doreatha Martin who is a vascular surgeon and  He has no PND orthopnea chest pain or claudication    12/06/2021  He had to go to the emergency room the other day they did find another stone in the ureter he is known to have bilateral kidney stones anyways  He complains of lower buttock pain when he wakes up in the morning he wants something done about that he had had lumbar spine surgery in the past discectomy in 2011  Medications were reviewed and reconciled    03/07/2022  He did see the nephrologist the urologist and the neurologist  No changes have been made to his medications which were reviewed and reconciled by myself  He continues to complain 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 I told him really there is not much to do unless he really wants Botox injections    06/06/2022  Unfortunately in spite of Crestor and fenofibrate his cholesterol is still at 168 LDL  No breathing troubles or chest pains no bowel or urinary problems he has no other complaints    Past Medical History:     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    x2 last 2018    Functional diarrhea 06/03/2020    H/O lumbar discectomy 08/12/2018    2011     H/O: vasectomy 08/12/2018    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    Hyperplastic colonic polyp 08/12/2018    2017 return in 5 years    Hypertension     LAFB (left anterior fascicular block)  08/12/2018    Mixed hyperlipidemia 08/12/2018    Nephrolithiasis 08/12/2018    x2 last 2018     Prediabetes 09/06/2021    Premature ejaculation 08/12/2018    Preop examination 09/29/2019    Stage 3a chronic kidney disease 09/09/2020    Throat fullness 08/28/2019       Past Surgical History:     Past Surgical History:   Procedure Laterality Date    BUNIONECTOMY  02/09/2015    COLONOSCOPY  07/2007    VASECTOMY  05/1992       Family History:     Family History   Problem Relation Age of Onset    Stroke Mother        Social History:     Social History     Socioeconomic History    Marital status: Significant Other     Spouse name: None    Number of children: None    Years of education: None    Highest education level: None   Occupational History    None   Tobacco Use    Smoking status: Never    Smokeless tobacco: Never   Vaping Use    Vaping Use: Never used   Substance and Sexual Activity    Alcohol use: Yes     Alcohol/week: 7.0 standard drinks of alcohol     Types: 7 Glasses of wine per week     Comment: 1 glass daily    Drug use: No    Sexual activity: None   Other Topics Concern    None   Social History Narrative    None     Social Determinants of Health     Financial Resource Strain: Medium Risk (12/08/2021)    Overall Financial Resource Strain (CARDIA)     Difficulty of Paying Living Expenses: Somewhat hard   Food Insecurity: No Food Insecurity (12/08/2021)    Hunger Vital Sign     Worried About Running Out of Food in the Last Year: Never true     Ran Out of Food in the Last Year: Never true   Transportation Needs: No Transportation Needs (12/08/2021)    PRAPARE - Therapist, art (Medical): No     Lack of Transportation (Non-Medical): No   Physical Activity: Insufficiently Active (12/08/2021)    Exercise Vital Sign     Days of Exercise per Week: 3 days     Minutes of Exercise per Session: 30 min   Stress:  No Stress Concern Present (12/08/2021)    [employer] of Occupational Health -  Occupational Stress Questionnaire     Feeling of Stress : Only a little   Social Connections: Socially Isolated (12/08/2021)    Social Connection and Isolation Panel [NHANES]     Frequency of Communication with Friends and Family: More than three times a week     Frequency of Social Gatherings with Friends and Family: Never     Attends Religious Services: Never     Database administrator or Organizations: No     Attends Engineer, structural: Not on file     Marital Status: Divorced   Intimate Partner Violence: Not At Risk (12/08/2021)    Humiliation, Afraid, Rape, and Kick questionnaire     Fear of Current or Ex-Partner: No     Emotionally Abused: No     Physically Abused: No     Sexually Abused: No   Housing Stability: Low Risk  (12/08/2021)    Housing Stability Vital Sign     Unable to Pay for Housing in the Last Year: No     Number of Places Lived in the Last Year: 1     Unstable Housing in the Last Year: No       Allergies:   No Known Allergies    Medications:     Current Outpatient Medications   Medication Sig Dispense Refill    evolocumab (Repatha SureClick) 140 MG/ML subcutaneous auto-injector Inject 1 mL (140 mg) into the skin every 14 (fourteen) days 6 each 0    Multiple Vitamins-Minerals (MULTIVITAMIN WITH MINERALS) tablet Take 1 tablet by mouth daily      sertraline (ZOLOFT) 50 MG tablet TAKE 1 TABLET BY MOUTH EVERY DAY 90 tablet 0    tadalafil (CIALIS) 20 MG tablet Take 1 tablet (20 mg) by mouth daily as needed for Erectile Dysfunction 10 tablet 3    valACYclovir (VALTREX) 1000 MG tablet Take 1 tablet (1,000 mg) by mouth 2 (two) times daily 20 tablet 1    valsartan-hydroCHLOROthiazide (DIOVAN-HCT) 320-25 MG per tablet TAKE 1 TABLET BY MOUTH EVERY DAY 90 tablet 1    vitamin D (CHOLECALCIFEROL) 1000 units tablet Take 5 tablets (5,000 Units) by mouth daily      Xarelto 20 MG Tab TAKE 1 TABLET BY MOUTH EVERY DAY WITH DINNER 90 tablet 1     No current facility-administered medications for this visit.        Review of Systems:   A comprehensive review of systems was:   General ROS:   Respiratory ROS: no cough, shortness of breath, or wheezing  Cardiovascular ROS: no chest pain or dyspnea on exertion  Gastrointestinal ROS: occasional intermittent diarrhea  Genito-Urinary ROS: no dysuria, trouble voiding, or hematuria. Recurrent herpes simplex attacks  Musculoskeletal ROS: Bilateral lower buttock pain   neurological ROS: Intermittent numbness of the right toes while driving  Dermatologic ROS: Chronic bilateral hand intermittent hyperhidrosis    Physical Exam:     Vitals:    09/07/22 0912   BP: 130/82   Pulse: 62   Temp: 97.1 F (36.2 C)   SpO2: 96%     BP Readings from Last 3 Encounters:   09/07/22 130/82   06/06/22 (!) 135/93   03/07/22 119/80     Wt Readings from Last 3 Encounters:   09/07/22 95.7 kg (211 lb)   06/06/22 97.2 kg (214 lb 4.8 oz)   03/07/22 97.5 kg (215 lb)   Body  mass index is 30.28 kg/m.    General appearance - alert, well appearing, and in no distress  Chest - clear to auscultation, no wheezes, rales or rhonchi, symmetric air entry  Heart - normal rate, regular rhythm, normal S1, S2, no murmurs, rubs, clicks or gallops  Abdomen - soft, nontender, nondistended, no masses or organomegaly  Neurological - alert, oriented, normal speech, no focal findings or movement disorder noted  Extremities - peripheral pulses normal,  no clubbing or cyanosis   trace edema and vertical dermatitis around the right ankle    Labs:     Recent Results (from the past 2016 hour(s))   Basic Metabolic Panel    Collection Time: 09/05/22  8:09 AM   Result Value Ref Range    Glucose 98 70 - 99 mg/dL    BUN 19 8 - 27 mg/dL    Creatinine 1.61 0.96 - 1.27 mg/dL    eGFR 64 >04 VW/UJW/1.19    BUN / Creatinine Ratio 15 10 - 24    Sodium 141 134 - 144 mmol/L    Potassium 4.1 3.5 - 5.2 mmol/L    Chloride 101 96 - 106 mmol/L    CO2 25 20 - 29 mmol/L    Calcium 9.9 8.6 - 10.2 mg/dL   Lipid panel    Collection Time: 09/05/22  8:09 AM    Result Value Ref Range    Cholesterol 187 100 - 199 mg/dL    Triglycerides 84 0 - 149 mg/dL    HDL 76 >14 mg/dL    VLDL Calculated 15 5 - 40 mg/dL    LDL Chol Calculated (NIH) 96 0 - 99 mg/dL    Cholesterol / HDL Ratio 2.5 0.0 - 5.0 ratio   Hemoglobin A1C    Collection Time: 09/05/22  8:09 AM   Result Value Ref Range    Hemoglobin A1C 5.5 4.8 - 5.6 %         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        1. 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 Hyperlipidemia Not well controlled he is supposed to be on Crestor 40 and TriCor however he does not have Crestor for quite a while now that he is back on his medications for 3 months with an LDL of 168 we will discontinue both and start him on Repatha 140 mg every 2 weeks if his insurance accepts    3.  Recurrent genital herpes about once a year and he  gets Valtrex for 5 days new prescription was given    4.  Hypertension not well-controlled because of poor compliance I admonished his behavior  Valsartan was increased last time to 320/25 and the levels are much better now    5.  History of premature ejaculation and he uses Zoloft for that, And Viagra for erectile  dysfunction  He was taking Zoloft as needed I told him he needs to take it every day    6.  History of colonic polyps last colonoscopy was in 2017 and repeat colonoscopy in 01/2020 if on only 1 polyp nonmalignant told him to come back in 10 years    7.  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  History of kidney stones twice that he can remember drinking plenty of fluids.  Follow-up with urology    9. Erectile dysfunction and he Wanted an increase in the dose of Cialis therefore we gave him 20 mg    10.  Sensation of fullness in the throat status post steroid treatment by ENT however no improvement he was supposed to get a panendoscopy by Dr. Harrold Donath we did not discuss this today    11.  Diarrhea 4-5 times after antibiotic treatment for tooth extraction Flagyl that I gave him did not work he saw gastroenterology they did a colonoscopy that only found 1 polyp him back in 10 years  However he tells me he gets watery diarrhea still intermittently I therefore advised him to go back and talk to the gastroenterologist, which he did and they gave him loperamide and Metamucil strange enough I told him if he is not happy he should find another gastroenterologist    12.  CKD stage III 8 so far he is quite stable avoid nephrotoxins make sure his blood pressure is well controlled.  I would send him for consultation with Dr. Lauralee Evener from nephrology, he continues to follow-up with them and they continue to tell him everything is stable    13.  Longstanding history of tingling and numbness in his right foot when he is driving less so otherwise I have referred him to neurology for nerve conduction studies    14.  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 Loma Newton who is referring him to Dr. Doreatha Martin from vascular surgery.  He does not remember seeing either one of them we will try and get his report    15.  Bilateral lower buttock pain in a  person who had a history of lumbar discectomy in 2011 this happens in the mornings he wanted something done I would send him to neurology Dr. Mikey Kirschner refused MRI of the spine but they did MRI of the buttocks which showed gluteus minimus tendinosis and he was referred to physical therapy    16.  Bilateral nephrolithiasis with intermittent ureteral stones the last 1 was last month November/2022 we deferred referral to urology at this moment in time    17.  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 Grammatical errors, random word insertions, deletions and pronoun errors  are occasional consequences of this technology due to software limitations.   Not all errors are caught or corrected. If there are questions or concerns about the content of this note or information contained within the body of this dictation they should be addressed directly with the author for clarification.      Signed by: Nyra Jabs, MD

## 2022-09-13 ENCOUNTER — Other Ambulatory Visit: Payer: Self-pay | Admitting: Urology

## 2022-09-13 DIAGNOSIS — N200 Calculus of kidney: Secondary | ICD-10-CM

## 2022-09-21 ENCOUNTER — Ambulatory Visit
Admission: RE | Admit: 2022-09-21 | Discharge: 2022-09-21 | Disposition: A | Discharge status: Home / Self Care | Payer: BC Managed Care – PPO | Source: Ambulatory Visit | Attending: Urology | Admitting: Urology

## 2022-09-21 DIAGNOSIS — N200 Calculus of kidney: Secondary | ICD-10-CM | POA: Insufficient documentation

## 2022-10-05 ENCOUNTER — Encounter: Payer: Self-pay | Admitting: Internal Medicine

## 2022-10-07 ENCOUNTER — Other Ambulatory Visit: Payer: Self-pay | Admitting: Internal Medicine

## 2022-10-08 ENCOUNTER — Other Ambulatory Visit: Payer: Self-pay | Admitting: Internal Medicine

## 2022-10-23 ENCOUNTER — Other Ambulatory Visit: Payer: Self-pay | Admitting: Urology

## 2022-10-23 DIAGNOSIS — N200 Calculus of kidney: Secondary | ICD-10-CM

## 2022-10-24 ENCOUNTER — Encounter: Payer: Self-pay | Admitting: Internal Medicine

## 2022-10-24 ENCOUNTER — Ambulatory Visit: Payer: BC Managed Care – PPO | Admitting: Internal Medicine

## 2022-10-24 VITALS — BP 134/82 | HR 56 | Temp 97.4°F | Ht 70.0 in | Wt 209.0 lb

## 2022-10-24 DIAGNOSIS — Z01818 Encounter for other preprocedural examination: Secondary | ICD-10-CM

## 2022-10-24 DIAGNOSIS — I82411 Acute embolism and thrombosis of right femoral vein: Secondary | ICD-10-CM

## 2022-10-24 DIAGNOSIS — I10 Essential (primary) hypertension: Secondary | ICD-10-CM

## 2022-10-24 DIAGNOSIS — N200 Calculus of kidney: Secondary | ICD-10-CM

## 2022-10-24 MED ORDER — FLUOCINONIDE 0.05 % EX CREA
TOPICAL_CREAM | Freq: Every day | CUTANEOUS | 1 refills | Status: DC
Start: 2022-10-24 — End: 2023-03-15

## 2022-10-24 NOTE — Addendum Note (Signed)
Addended by: Earvin Hansen on: 10/24/2022 10:59 AM     Modules accepted: Orders

## 2022-10-24 NOTE — Progress Notes (Signed)
Preop examination      Date Time: 10/24/2022 9:38 AM  Patient Name: [name], [name]   DOB: [birth year]    Subjective:   [name] is a 66 y.o. male who presents for a preop examination    He is known to have a history of DVT of the right leg twice in 2018, hypertension, left anterior fascicular block, mixed hyperlipidemia, colonic polyps, bilateral nephrolithiasis, history of herpes simplex infection of the penis, history of stage IIIa chronic kidney disease, history of lumbar discectomy, history of vasectomy, major depressive disorder, history of prediabetes    (Historically)  In December 2020 he had a tooth problem and extraction and he was given antibiotics for about a week  Since then he has been having daily 4-5 times diarrhea without blood or mucus but he has some tenesmus he said  I gave him a course of Flagyl this did not help he went to gastroenterology and he had a colonoscopy they found 1 polyp which was not malignant but told him to come back in 10 years  History has intermittent diarrhea and they gave him loperamide    His blood work shows that his cholesterol is high and his blood pressure is still high as well    06/03/2020  He feels very good in himself he has no breathing troubles or chest pains no bowel or urinary problems his medications were reviewed with him he has tolerated the higher dose of the valsartan and its time to check his kidney function    09/09/2020  The gastroenterologist gave him loperamide for his intermittent loose stools and he gave him Metamucil which she has not taken  When he drives the toes in his right foot become a little bit numb and sometimes painful he has to shake his foot that everything is okay he has had DVT in that leg and he was worried  He has been taking his fenofibrate along with rosuvastatin and its time to recheck  He has not been taking Zoloft for his premature ejaculation on a routine basis only as needed which I told him of the wrong  way    01/31/2021  He feels very good in himself has no breathing troubles chest pain bowel or urinary problems  However he complains of tingling and numbness in his foot when he is driving less so otherwise    06/02/2021  He does not have a refill on his Valtrex and unfortunately he has an outbreak and he did not have the medicines  He is trying to exercise and lose weight he is not very successful unfortunately  No changes to the rest of his medications no breathing trouble chest pain no bowel or urinary problems  Medications reviewed and reconciled    09/06/2021  He did see the pain management doctors we did a CAT scan and 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 Loma Newton who referred him to Dr. Doreatha Martin who is a vascular surgeon and  He has no PND orthopnea chest pain or claudication    12/06/2021  He had to go to the emergency room the other day they did find another stone in the ureter he is known to have bilateral kidney stones anyways  He complains of lower buttock pain when he wakes up in the morning he wants something done  about that he had had lumbar spine surgery in the past discectomy in 2011  Medications were reviewed and reconciled    03/07/2022  He did see the nephrologist the urologist and the neurologist  No changes have been made to his medications which were reviewed and reconciled by myself  He continues to complain of 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 I told him really there is not much to do unless he really wants Botox injections    06/06/2022  Unfortunately in spite of Crestor and fenofibrate his cholesterol is still at 168 LDL  No breathing troubles or chest pains no bowel or urinary problems he has no other complaints    10/24/2022  He is here for preop clearance  He is going for an ESWL in a couple of weeks  Since I saw him last time  no new complaints he continues on taking all of his current medications without any problems    Past Medical History:     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    x2 last 2018    Functional diarrhea 06/03/2020    H/O lumbar discectomy 08/12/2018    2011     H/O: vasectomy 08/12/2018    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    Hyperplastic colonic polyp 08/12/2018    2017 return in 5 years    Hypertension     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    Mixed hyperlipidemia 08/12/2018    Nephrolithiasis 08/12/2018    x2 last 2018     Prediabetes 09/06/2021    Premature ejaculation 08/12/2018    Preop examination 09/29/2019    Stage 3a chronic kidney disease 09/09/2020    Throat fullness 08/28/2019       Past Surgical History:     Past Surgical History:   Procedure Laterality Date    BUNIONECTOMY  02/09/2015    COLONOSCOPY  07/2007    VASECTOMY  05/1992       Family History:     Family History   Problem Relation Age of Onset    Stroke Mother        Social History:     Social History     Socioeconomic History    Marital status: Significant Other     Spouse name: None    Number of children: None    Years of education: None    Highest education level: None   Occupational History    None   Tobacco Use    Smoking status: Never    Smokeless tobacco: Never   Vaping Use    Vaping Use: Never used   Substance and Sexual Activity    Alcohol use: Yes     Alcohol/week: 7.0 standard drinks of alcohol     Types: 7 Glasses of wine per week     Comment: 1 glass daily    Drug use: No    Sexual activity: None   Other Topics Concern    None   Social History Narrative    None     Social Determinants of Health     Financial Resource Strain: Medium Risk (12/08/2021)    Overall Financial Resource Strain (CARDIA)     Difficulty of Paying Living Expenses: Somewhat hard   Food Insecurity:  No Food Insecurity (12/08/2021)    Hunger Vital Sign     Worried About Running Out of Food in the Last Year: Never true     Ran Out of Food in the Last Year: Never true   Transportation Needs: No Transportation Needs (12/08/2021)    PRAPARE - Therapist, art (Medical): No     Lack of Transportation (Non-Medical): No   Physical Activity: Insufficiently Active (12/08/2021)    Exercise Vital Sign     Days of Exercise per Week: 3 days     Minutes of Exercise per Session: 30 min   Stress: No Stress Concern Present (12/08/2021)    [employer] of Occupational Health - Occupational Stress Questionnaire     Feeling of Stress : Only a little   Social Connections: Socially Isolated (12/08/2021)    Social Connection and Isolation Panel [NHANES]     Frequency of Communication with Friends and Family: More than three times a week     Frequency of Social Gatherings with Friends and Family: Never     Attends Religious Services: Never     Database administrator or Organizations: No     Attends Engineer, structural: Not on file     Marital Status: Divorced   Intimate Partner Violence: Not At Risk (12/08/2021)    Humiliation, Afraid, Rape, and Kick questionnaire     Fear of Current or Ex-Partner: No     Emotionally Abused: No     Physically Abused: No     Sexually Abused: No   Housing Stability: Low Risk  (12/08/2021)    Housing Stability Vital Sign     Unable to Pay for Housing in the Last Year: No     Number of Places Lived in the Last Year: 1     Unstable Housing in the Last Year: No       Allergies:   No Known Allergies    Medications:     Current Outpatient Medications   Medication Sig Dispense Refill    evolocumab (Repatha SureClick) 140 MG/ML subcutaneous auto-injector Inject 1 mL (140 mg) into the skin every 14 (fourteen) days 6 each 0    fluocinonide (LIDEX) 0.05 % cream Apply topically daily 15 g 1    Multiple Vitamins-Minerals (MULTIVITAMIN WITH MINERALS) tablet Take 1 tablet by  mouth daily      sertraline (ZOLOFT) 50 MG tablet TAKE 1 TABLET BY MOUTH EVERY DAY 90 tablet 0    tadalafil (CIALIS) 20 MG tablet Take 1 tablet (20 mg) by mouth daily as needed for Erectile Dysfunction 10 tablet 3    valACYclovir (VALTREX) 1000 MG tablet Take 1 tablet (1,000 mg) by mouth 2 (two) times daily 20 tablet 1    valsartan-hydroCHLOROthiazide (DIOVAN-HCT) 320-25 MG per tablet TAKE 1 TABLET BY MOUTH EVERY DAY 90 tablet 1    vitamin D (CHOLECALCIFEROL) 1000 units tablet Take 5 tablets (5,000 Units) by mouth daily      Xarelto 20 MG Tab TAKE 1 TABLET BY MOUTH EVERY DAY WITH DINNER 90 tablet 1     No current facility-administered medications for this visit.       Review of Systems:   A comprehensive review of systems was:   General ROS:   Respiratory ROS: no cough, shortness of breath, or wheezing  Cardiovascular ROS: no chest pain or dyspnea on exertion  Gastrointestinal ROS: occasional intermittent diarrhea  Genito-Urinary ROS: no dysuria, trouble voiding, or hematuria. Recurrent  herpes simplex attacks  Musculoskeletal ROS: Bilateral lower buttock pain intermittently  neurological ROS: Intermittent numbness of the right toes while driving  Dermatologic ROS: Chronic bilateral hand intermittent hyperhidrosis    Physical Exam:     Vitals:    10/24/22 0902   BP: 134/82   Pulse: (!) 56   Temp: 97.4 F (36.3 C)   SpO2: 98%     BP Readings from Last 3 Encounters:   10/24/22 134/82   09/07/22 130/82   06/06/22 (!) 135/93     Wt Readings from Last 3 Encounters:   10/24/22 94.8 kg (209 lb)   09/07/22 95.7 kg (211 lb)   06/06/22 97.2 kg (214 lb 4.8 oz)   Body mass index is 29.99 kg/m.    General appearance - alert, well appearing, and in no distress  Chest - clear to auscultation, no wheezes, rales or rhonchi, symmetric air entry  Heart - normal rate, regular rhythm, normal S1, S2, no murmurs, rubs, clicks or gallops  Abdomen - soft, nontender, nondistended, no masses or organomegaly  Neurological - alert, oriented,  normal speech, no focal findings or movement disorder noted  Extremities - peripheral pulses normal,  no clubbing or cyanosis   trace edema and vertical dermatitis around the right ankle    ECG shows sinus bradycardia at the rate of 47 bpm    Labs:     Recent Results (from the past 2016 hour(s))   Basic Metabolic Panel    Collection Time: 09/05/22  8:09 AM   Result Value Ref Range    Glucose 98 70 - 99 mg/dL    BUN 19 8 - 27 mg/dL    Creatinine 1.61 0.96 - 1.27 mg/dL    eGFR 64 >04 VW/UJW/1.19    BUN / Creatinine Ratio 15 10 - 24    Sodium 141 134 - 144 mmol/L    Potassium 4.1 3.5 - 5.2 mmol/L    Chloride 101 96 - 106 mmol/L    CO2 25 20 - 29 mmol/L    Calcium 9.9 8.6 - 10.2 mg/dL   Lipid panel    Collection Time: 09/05/22  8:09 AM   Result Value Ref Range    Cholesterol 187 100 - 199 mg/dL    Triglycerides 84 0 - 149 mg/dL    HDL 76 >14 mg/dL    VLDL Calculated 15 5 - 40 mg/dL    LDL Chol Calculated (NIH) 96 0 - 99 mg/dL    Cholesterol / HDL Ratio 2.5 0.0 - 5.0 ratio   Hemoglobin A1C    Collection Time: 09/05/22  8:09 AM   Result Value Ref Range    Hemoglobin A1C 5.5 4.8 - 5.6 %         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    Hypertension    H/O lumbar discectomy    Bilateral nephrolithiasis    Mixed hyperlipidemia    Premature ejaculation    Herpes simplex infection of penis    LAFB (left anterior fascicular block)    Throat fullness    COVID-19 virus infection    Adenomatous polyp of colon    Preop examination    Functional diarrhea    Major depressive disorder, recurrent episode, mild    Major depressive disorder, recurrent episode, moderate    Recurrent major  depression    Stage 3a chronic kidney disease    Prediabetes       Orders Placed This Encounter   Procedures    Urine culture     Order Specific Question:   Release to patient     Answer:   Immediate    CBC and differential      Order Specific Question:   Release to patient     Answer:   Immediate    Basic Metabolic Panel     Order Specific Question:   Has the patient fasted?     Answer:   Yes     Order Specific Question:   Release to patient     Answer:   Immediate    Urinalysis     Order Specific Question:   URINE TYPE     Answer:   Clean Catch     Order Specific Question:   Release to patient     Answer:   Immediate    Ambulatory referral to Hematology     Standing Status:   Future     Standing Expiration Date:   10/25/2023     Referral Priority:   Routine     Referral Type:   Consultation     Referral Reason:   Specialty Services Required     Referred to Provider:   Fredric Mare, MD     Requested Specialty:   Hematology     Number of Visits Requested:   1         1. History of recurrent right lower extremity DVT he is on Xarelto and he has intermittent trace edema and chronic varicose dermatitis  I made him a referral for consultation with Dr. 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 Hyperlipidemia Not well controlled he is supposed to be on Crestor 40 and TriCor however he does not have Crestor for quite a while now that he is back on his medications for 3 months with an LDL of 168 we will discontinue both and start him on Repatha 140 mg every 2 weeks if his insurance accepts    3.  Recurrent genital herpes about once a year and he gets Valtrex for 5 days new prescription was given    4.  Hypertension not well-controlled because of poor compliance I admonished his behavior  Valsartan was increased last time to 320/25 and the levels are much better now    5.  History of premature ejaculation and he uses Zoloft for that, And Viagra for erectile dysfunction  He was taking Zoloft as needed I told him he needs to take it every day    6.  History of colonic polyps last colonoscopy was in 2017 and repeat colonoscopy in 01/2020 if on only 1 polyp nonmalignant told him to come back in 10 years    7.  Left anterior fascicular  block nothing to do about that    8.  History of kidney stones twice that he can remember drinking plenty of fluids.  Follow-up with urology    9. Erectile dysfunction and he Wanted an increase in the dose of Cialis therefore we gave him 20 mg    10.  Sensation of fullness in the throat status post steroid treatment by ENT however no improvement he was supposed to get a panendoscopy by Dr. Harrold Donath we did not discuss this today    11.  Diarrhea 4-5 times after antibiotic treatment for tooth  extraction Flagyl that I gave him did not work he saw gastroenterology they did a colonoscopy that only found 1 polyp him back in 10 years  However he tells me he gets watery diarrhea still intermittently I therefore advised him to go back and talk to the gastroenterologist, which he did and they gave him loperamide and Metamucil strange enough I told him if he is not happy he should find another gastroenterologist    12.  CKD stage III 8 so far he is quite stable avoid nephrotoxins make sure his blood pressure is well controlled.  I would send him for consultation with Dr. Lauralee Evener from nephrology, he continues to follow-up with them and they continue to tell him everything is stable    13.  Longstanding history of tingling and numbness in his right foot when he is driving less so otherwise I have referred him to neurology for nerve conduction studies    14.  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 Loma Newton who is referring him to Dr. Doreatha Martin from vascular surgery.  He does not remember seeing either one of them we will try and get his report    15.  Bilateral lower buttock pain in a person who had a history of lumbar discectomy in 2011 this happens in the mornings he wanted something done I would send him to neurology Dr. Mikey Kirschner refused MRI of the spine but they did MRI of the buttocks which showed gluteus minimus tendinosis and he was referred to  physical therapy    16.  Bilateral nephrolithiasis with intermittent ureteral stones the last 1 was last month November/2022 we deferred referral to urology at this moment in time    17.  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  He is medically cleared for his ESWL procedure  He will hold his anticoagulation for a week prior to surgery and he can restart after that  EKG and labs will be faxed with this clearance    He received his COVID-19 immunization  He also received his flu vaccine and shingles vaccine    RTC in 3 months       This note was generated by the Epic EMR system/Speech recognition and may contain inherent errors or omissions not intended by the user. Grammatical errors, random word insertions, deletions and pronoun errors  are occasional consequences of this technology due to software limitations.   Not all errors are caught or corrected. If there are questions or concerns about the content of this note or information contained within the body of this dictation they should be addressed directly with the author for clarification.      Signed by: Nyra Jabs, MD

## 2022-10-25 LAB — URINALYSIS
Bilirubin, UA: NEGATIVE
Blood, UA: NEGATIVE
Glucose, UA: NEGATIVE
Ketones UA: NEGATIVE
Leukocyte Esterase, UA: NEGATIVE
Nitrite, UA: NEGATIVE
Protein, UR: NEGATIVE
Specific Gravity UA: 1.021 (ref 1.005–1.030)
Urine pH: 7 (ref 5.0–7.5)
Urobilinogen, UA: 0.2 mg/dL (ref 0.2–1.0)

## 2022-10-25 LAB — CBC AND DIFFERENTIAL
Baso(Absolute): 0 10*3/uL (ref 0.0–0.2)
Basophils Automated: 0 %
Eosinophils Absolute: 0 10*3/uL (ref 0.0–0.4)
Eosinophils Automated: 1 %
Hematocrit: 50.1 % (ref 37.5–51.0)
Hemoglobin: 17.1 g/dL (ref 13.0–17.7)
Immature Granulocytes Absolute: 0 10*3/uL (ref 0.0–0.1)
Immature Granulocytes: 0 %
Lymphocytes Absolute: 1.7 10*3/uL (ref 0.7–3.1)
Lymphocytes Automated: 52 %
MCH: 30.1 pg (ref 26.6–33.0)
MCHC: 34.1 g/dL (ref 31.5–35.7)
MCV: 88 fL (ref 79–97)
Monocytes Absolute: 0.4 10*3/uL (ref 0.1–0.9)
Monocytes: 11 %
Neutrophils Absolute Count: 1.2 10*3/uL — ABNORMAL LOW (ref 1.4–7.0)
Neutrophils: 36 %
Platelets: 161 10*3/uL (ref 150–450)
RBC: 5.68 x10E6/uL (ref 4.14–5.80)
RDW: 12.1 % (ref 11.6–15.4)
WBC: 3.2 10*3/uL — ABNORMAL LOW (ref 3.4–10.8)

## 2022-10-25 LAB — BASIC METABOLIC PANEL
BUN / Creatinine Ratio: 12 (ref 10–24)
BUN: 15 mg/dL (ref 8–27)
CO2: 26 mmol/L (ref 20–29)
Calcium: 9.7 mg/dL (ref 8.6–10.2)
Chloride: 102 mmol/L (ref 96–106)
Creatinine: 1.29 mg/dL — ABNORMAL HIGH (ref 0.76–1.27)
Glucose: 98 mg/dL (ref 70–99)
Potassium: 4.2 mmol/L (ref 3.5–5.2)
Sodium: 141 mmol/L (ref 134–144)
eGFR: 62 mL/min/{1.73_m2} (ref >59)

## 2022-10-27 LAB — URINE CULTURE: Result 1:: NO GROWTH

## 2022-10-31 ENCOUNTER — Ambulatory Visit
Admission: RE | Admit: 2022-10-31 | Discharge: 2022-10-31 | Disposition: A | Discharge status: Home / Self Care | Payer: BC Managed Care – PPO | Source: Ambulatory Visit | Attending: Urology | Admitting: Urology

## 2022-10-31 DIAGNOSIS — N200 Calculus of kidney: Secondary | ICD-10-CM | POA: Insufficient documentation

## 2022-11-07 ENCOUNTER — Other Ambulatory Visit: Payer: Self-pay | Admitting: Internal Medicine

## 2022-11-09 HISTORY — PX: OTHER SURGICAL HISTORY: SHX169

## 2022-11-11 ENCOUNTER — Other Ambulatory Visit: Payer: Self-pay | Admitting: Internal Medicine

## 2022-11-19 ENCOUNTER — Other Ambulatory Visit: Payer: Self-pay | Admitting: Urology

## 2022-11-19 DIAGNOSIS — N200 Calculus of kidney: Secondary | ICD-10-CM

## 2022-11-23 ENCOUNTER — Ambulatory Visit
Admission: RE | Admit: 2022-11-23 | Discharge: 2022-11-23 | Disposition: A | Discharge status: Home / Self Care | Payer: BC Managed Care – PPO | Source: Ambulatory Visit | Attending: Urology | Admitting: Urology

## 2022-11-23 DIAGNOSIS — N200 Calculus of kidney: Secondary | ICD-10-CM | POA: Insufficient documentation

## 2022-12-07 ENCOUNTER — Ambulatory Visit: Payer: BC Managed Care – PPO | Admitting: Internal Medicine

## 2022-12-07 ENCOUNTER — Other Ambulatory Visit: Payer: Self-pay | Admitting: Internal Medicine

## 2022-12-07 ENCOUNTER — Encounter: Payer: Self-pay | Admitting: Internal Medicine

## 2022-12-07 VITALS — BP 119/81 | HR 62 | Temp 97.6°F | Ht 70.0 in | Wt 204.0 lb

## 2022-12-07 DIAGNOSIS — N1831 Chronic kidney disease, stage 3a: Secondary | ICD-10-CM

## 2022-12-07 DIAGNOSIS — I82411 Acute embolism and thrombosis of right femoral vein: Secondary | ICD-10-CM

## 2022-12-07 DIAGNOSIS — E782 Mixed hyperlipidemia: Secondary | ICD-10-CM

## 2022-12-07 DIAGNOSIS — F330 Major depressive disorder, recurrent, mild: Secondary | ICD-10-CM

## 2022-12-07 DIAGNOSIS — I10 Essential (primary) hypertension: Secondary | ICD-10-CM

## 2022-12-07 DIAGNOSIS — N200 Calculus of kidney: Secondary | ICD-10-CM

## 2022-12-07 MED ORDER — REPATHA SURECLICK 140 MG/ML SC SOAJ
SUBCUTANEOUS | 2 refills | Status: DC
Start: 2022-12-07 — End: 2022-12-10

## 2022-12-07 MED ORDER — GABAPENTIN 100 MG PO CAPS
100.0000 mg | ORAL_CAPSULE | Freq: Three times a day (TID) | ORAL | 0 refills | Status: DC | PRN
Start: 2022-12-07 — End: 2023-03-15

## 2022-12-07 NOTE — Progress Notes (Signed)
Preop examination    Date Time: 12/07/2022 9:44 AM  Patient Name: [name], [name]   DOB: 1956-11-08    Subjective:   [name] is a 66 y.o. male who presents for a routine medical follow-up visit    He is known to have a history of DVT of the right leg twice in 2018, hypertension, left anterior fascicular block, mixed hyperlipidemia, colonic polyps, bilateral nephrolithiasis, history of herpes simplex infection of the penis, history of stage IIIa chronic kidney disease, history of lumbar discectomy, history of vasectomy, major depressive disorder, history of prediabetes    (Historically)  In December 2020 he had a tooth problem and extraction and he was given antibiotics for about a week  Since then he has been having daily 4-5 times diarrhea without blood or mucus but he has some tenesmus he said  I gave him a course of Flagyl this did not help he went to gastroenterology and he had a colonoscopy they found 1 polyp which was not malignant but told him to come back in 10 years  History has intermittent diarrhea and they gave him loperamide    His blood work shows that his cholesterol is high and his blood pressure is still high as well    06/03/2020  He feels very good in himself he has no breathing troubles or chest pains no bowel or urinary problems his medications were reviewed with him he has tolerated the higher dose of the valsartan and its time to check his kidney function    09/09/2020  The gastroenterologist gave him loperamide for his intermittent loose stools and he gave him Metamucil which she has not taken  When he drives the toes in his right foot become a little bit numb and sometimes painful he has to shake his foot that everything is okay he has had DVT in that leg and he was worried  He has been taking his fenofibrate along with rosuvastatin and its time to recheck  He has not been taking Zoloft for his premature ejaculation on a routine basis only as needed which I told him of the wrong  way    01/31/2021  He feels very good in himself has no breathing troubles chest pain bowel or urinary problems  However he complains of tingling and numbness in his foot when he is driving less so otherwise    06/02/2021  He does not have a refill on his Valtrex and unfortunately he has an outbreak and he did not have the medicines  He is trying to exercise and lose weight he is not very successful unfortunately  No changes to the rest of his medications no breathing trouble chest pain no bowel or urinary problems  Medications reviewed and reconciled    09/06/2021  He did see the pain management doctors we did a CAT scan and 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 Loma Newton who referred him to Dr. Doreatha Martin who is a vascular surgeon and  He has no PND orthopnea chest pain or claudication    12/06/2021  He had to go to the emergency room the other day they did find another stone in the ureter he is known to have bilateral kidney stones anyways  He complains of lower buttock pain when he wakes up in the morning he wants something done  about that he had had lumbar spine surgery in the past discectomy in 2011  Medications were reviewed and reconciled    03/07/2022  He did see the nephrologist the urologist and the neurologist  No changes have been made to his medications which were reviewed and reconciled by myself  He continues to complain of 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 I told him really there is not much to do unless he really wants Botox injections    06/06/2022  Unfortunately in spite of Crestor and fenofibrate his cholesterol is still at 168 LDL  No breathing troubles or chest pains no bowel or urinary problems he has no other complaints    10/24/2022  He is here for preop clearance  He is going for an ESWL in a couple of weeks  Since I saw him last time  no new complaints he continues on taking all of his current medications without any problems    12/07/2022  He did have his ESWL and he has a follow-up appointment next week  He continues to complain of the neuropathic pain in his right foot when he is driving or sometimes even at nighttime  Medications all reviewed and reconciled with him    Past Medical History:     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    x2 last 2018    Functional diarrhea 06/03/2020    H/O lumbar discectomy 08/12/2018    2011     H/O: vasectomy 08/12/2018    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    Hyperplastic colonic polyp 08/12/2018    2017 return in 5 years    Hypertension     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    Mixed hyperlipidemia 08/12/2018    Nephrolithiasis 08/12/2018    x2 last 2018     Prediabetes 09/06/2021    Premature ejaculation 08/12/2018    Preop examination 09/29/2019    Stage 3a chronic kidney disease 09/09/2020    Throat fullness 08/28/2019       Past Surgical History:     Past Surgical History:   Procedure Laterality Date    BUNIONECTOMY  02/09/2015    COLONOSCOPY  07/2007    VASECTOMY  05/1992       Family History:     Family History   Problem Relation Age of Onset    Stroke Mother        Social History:     Social History     Socioeconomic History    Marital status: Significant Other     Spouse name: None    Number of children: None    Years of education: None    Highest education level: None   Occupational History    None   Tobacco Use    Smoking status: Never    Smokeless tobacco: Never   Vaping Use    Vaping Use: Never used   Substance and Sexual Activity    Alcohol use: Yes     Alcohol/week: 7.0 standard drinks of alcohol     Types: 7 Glasses of wine per week     Comment: 1 glass daily    Drug use: No    Sexual activity: None   Other Topics Concern     None  Social History Narrative    None     Social Determinants of Health     Financial Resource Strain: Medium Risk (12/08/2021)    Overall Financial Resource Strain (CARDIA)     Difficulty of Paying Living Expenses: Somewhat hard   Food Insecurity: No Food Insecurity (12/08/2021)    Hunger Vital Sign     Worried About Running Out of Food in the Last Year: Never true     Ran Out of Food in the Last Year: Never true   Transportation Needs: No Transportation Needs (12/08/2021)    PRAPARE - Therapist, art (Medical): No     Lack of Transportation (Non-Medical): No   Physical Activity: Insufficiently Active (12/08/2021)    Exercise Vital Sign     Days of Exercise per Week: 3 days     Minutes of Exercise per Session: 30 min   Stress: No Stress Concern Present (12/08/2021)    [employer] of Occupational Health - Occupational Stress Questionnaire     Feeling of Stress : Only a little   Social Connections: Socially Isolated (12/08/2021)    Social Connection and Isolation Panel [NHANES]     Frequency of Communication with Friends and Family: More than three times a week     Frequency of Social Gatherings with Friends and Family: Never     Attends Religious Services: Never     Database administrator or Organizations: No     Attends Engineer, structural: Not on file     Marital Status: Divorced   Intimate Partner Violence: Not At Risk (12/08/2021)    Humiliation, Afraid, Rape, and Kick questionnaire     Fear of Current or Ex-Partner: No     Emotionally Abused: No     Physically Abused: No     Sexually Abused: No   Housing Stability: Low Risk  (12/08/2021)    Housing Stability Vital Sign     Unable to Pay for Housing in the Last Year: No     Number of Places Lived in the Last Year: 1     Unstable Housing in the Last Year: No       Allergies:   No Known Allergies    Medications:     Current Outpatient Medications   Medication Sig Dispense Refill    fluocinonide (LIDEX) 0.05 % cream Apply  topically daily 15 g 1    Multiple Vitamins-Minerals (MULTIVITAMIN WITH MINERALS) tablet Take 1 tablet by mouth daily      sertraline (ZOLOFT) 50 MG tablet TAKE 1 TABLET BY MOUTH EVERY DAY 90 tablet 0    valACYclovir (VALTREX) 1000 MG tablet Take 1 tablet (1,000 mg) by mouth 2 (two) times daily 20 tablet 1    valsartan-hydroCHLOROthiazide (DIOVAN-HCT) 320-25 MG per tablet TAKE 1 TABLET BY MOUTH EVERY DAY 90 tablet 1    vitamin D (CHOLECALCIFEROL) 1000 units tablet Take 5 tablets (5,000 Units) by mouth daily      Xarelto 20 MG Tab TAKE 1 TABLET BY MOUTH EVERY DAY WITH DINNER 90 tablet 1    evolocumab (Repatha SureClick) 140 MG/ML subcutaneous auto-injector INJECT 1 ML INTO THE SKIN EVERY 14 DAYS 1 mL 2    gabapentin (NEURONTIN) 100 MG capsule Take 1 capsule (100 mg) by mouth 3 (three) times daily as needed (pain) 30 capsule 0     No current facility-administered medications for this visit.       Review of Systems:   A  comprehensive review of systems was:   General ROS:   Respiratory ROS: no cough, shortness of breath, or wheezing  Cardiovascular ROS: no chest pain or dyspnea on exertion  Gastrointestinal ROS: occasional intermittent diarrhea  Genito-Urinary ROS: no dysuria, trouble voiding, or hematuria. Recurrent herpes simplex attacks  Musculoskeletal ROS: Bilateral lower buttock pain intermittently  neurological ROS: Intermittent numbness of the right toes while driving mostly but at nighttime also  Dermatologic ROS: Chronic bilateral hand intermittent hyperhidrosis    Physical Exam:     Vitals:    12/07/22 0853   BP: 119/81   Pulse: 62   Temp: 97.6 F (36.4 C)   SpO2: 98%     BP Readings from Last 3 Encounters:   12/07/22 119/81   10/24/22 134/82   09/07/22 130/82     Wt Readings from Last 3 Encounters:   12/07/22 92.5 kg (204 lb)   10/24/22 94.8 kg (209 lb)   09/07/22 95.7 kg (211 lb)   Body mass index is 29.27 kg/m.    General appearance - alert, well appearing, and in no distress  Chest - clear to  auscultation, no wheezes, rales or rhonchi, symmetric air entry  Heart - normal rate, regular rhythm, normal S1, S2, no murmurs, rubs, clicks or gallops  Abdomen - soft, nontender, nondistended, no masses or organomegaly  Neurological - alert, oriented, normal speech, no focal findings or movement disorder noted  Extremities - peripheral pulses normal,  no clubbing or cyanosis   trace edema and vertical dermatitis around the right ankle    ECG shows sinus bradycardia at the rate of 47 bpm    Labs:     Recent Results (from the past 2016 hour(s))   CBC and differential    Collection Time: 10/24/22 10:00 AM   Result Value Ref Range    WBC 3.2 (L) 3.4 - 10.8 x10E3/uL    RBC 5.68 4.14 - 5.80 x10E6/uL    Hemoglobin 17.1 13.0 - 17.7 g/dL    Hematocrit 27.2 53.6 - 51.0 %    MCV 88 79 - 97 fL    MCH 30.1 26.6 - 33.0 pg    MCHC 34.1 31.5 - 35.7 g/dL    RDW 64.4 03.4 - 74.2 %    Platelets 161 150 - 450 x10E3/uL    Neutrophils 36 Not Estab. %    Lymphocytes Automated 52 Not Estab. %    Monocytes 11 Not Estab. %    Eosinophils Automated 1 Not Estab. %    Basophils Automated 0 Not Estab. %    Neutrophils Absolute Count 1.2 (L) 1.4 - 7.0 x10E3/uL    Lymphocytes Absolute 1.7 0.7 - 3.1 x10E3/uL    Monocytes Absolute 0.4 0.1 - 0.9 x10E3/uL    Eosinophils Absolute 0.0 0.0 - 0.4 x10E3/uL    Baso(Absolute) 0.0 0.0 - 0.2 x10E3/uL    Immature Granulocytes 0 Not Estab. %    Immature Granulocytes Absolute 0.0 0.0 - 0.1 x10E3/uL   Basic Metabolic Panel    Collection Time: 10/24/22 10:00 AM   Result Value Ref Range    Glucose 98 70 - 99 mg/dL    BUN 15 8 - 27 mg/dL    Creatinine 5.95 (H) 0.76 - 1.27 mg/dL    eGFR 62 >63 OV/FIE/3.32    BUN / Creatinine Ratio 12 10 - 24    Sodium 141 134 - 144 mmol/L    Potassium 4.2 3.5 - 5.2 mmol/L    Chloride 102 96 - 106  mmol/L    CO2 26 20 - 29 mmol/L    Calcium 9.7 8.6 - 10.2 mg/dL   Urinalysis    Collection Time: 10/24/22 10:00 AM   Result Value Ref Range    Specific Gravity UA 1.021 1.005 - 1.030     Urine pH 7.0 5.0 - 7.5    Color, UA Yellow Yellow    Clarity, UA Clear Clear    Leukocyte Esterase, UA Negative Negative    Protein, UR Negative Negative/Trace    Glucose, UA Negative Negative    Ketones UA Negative Negative    Blood, UA Negative Negative    Bilirubin, UA Negative Negative    Urobilinogen, UA 0.2 0.2 - 1.0 mg/dL    Nitrite, UA Negative Negative    Urinalysis Microscopic Comment    Urine culture    Collection Time: 10/24/22 10:00 AM    Specimen: Urine, Clean Catch   Result Value Ref Range    Urine Culture, Routine Final report     Result 1: No growth          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        1. History of recurrent right lower extremity DVT he is on Xarelto and he has intermittent trace edema and chronic varicose dermatitis  I made him a referral for consultation with Dr. 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 Hyperlipidemia Not well controlled he is supposed to be on Crestor 40 and TriCor however he does not have Crestor for quite a while now that he is back on his medications for 3 months with an LDL of 168 we will discontinue both and start him on Repatha 140 mg every 2 weeks if his insurance accepts    3.  Recurrent genital herpes about once a year and he gets Valtrex for 5 days new  prescription was given    4.  Hypertension not well-controlled because of poor compliance I admonished his behavior  Valsartan was increased last time to 320/25 and the levels are much better now    5.  History of premature ejaculation and he uses Zoloft for that, And Viagra for erectile dysfunction  He was taking Zoloft as needed I told him he needs to take it every day    6.  History of colonic polyps last colonoscopy was in 2017 and repeat colonoscopy in 01/2020 if on only 1 polyp nonmalignant told him to come back in 10 years    7.  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  History of kidney stones twice  that he can remember drinking plenty of fluids.  Follow-up with urology    9. Erectile dysfunction and he Wanted an increase in the dose of Cialis therefore we gave him 20 mg    10.  Sensation of fullness in the throat status post steroid treatment by ENT however no improvement he was supposed to get a panendoscopy by Dr. Harrold DonathNathan we did not discuss this today    11.  Diarrhea 4-5 times after antibiotic treatment for tooth extraction Flagyl that I gave him did not work he saw gastroenterology they did a colonoscopy that only found 1 polyp him back in 10 years  However he tells me he gets watery diarrhea still intermittently I therefore advised him to go back and talk to the gastroenterologist, which he did and they gave him loperamide and Metamucil strange enough I told him if he is not happy he should find another gastroenterologist    12.  CKD stage III 8 so far he is quite stable avoid nephrotoxins make sure his blood pressure is well controlled.  I would send him for consultation with Dr. Lauralee EvenerShabshab from nephrology, he continues to follow-up with them and they continue to tell him everything is stable.  Continue to follow-up with him    13.  Longstanding history of tingling and numbness in his right foot when he is driving less so otherwise I have referred him to neurology for nerve  conduction studies but he never went I told him not to worry about it for now I gave him gabapentin to use on a as needed basis    14.  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 Loma NewtonNgo who is referring him to Dr. Doreatha MartinSam from vascular surgery.  He does not remember seeing either one of them we will try and get his report    15.  Bilateral lower buttock pain in a person who had a history of lumbar discectomy in 2011 this happens in the mornings he wanted something done I would send him to neurology Dr. Mikey KirschnerJain  Insurance refused MRI of the spine but they did MRI of the buttocks which showed gluteus minimus tendinosis and he was referred to physical therapy    16.  Bilateral nephrolithiasis with intermittent ureteral stones the last 1 was last month November/2022   He did have ESWL recently and he will follow-up with urology next week    17.  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  Vitamin D deficiency recheck next visit And take it from there in the meantime he can stop taking his supplements    He received his COVID-19 immunization  He also received his flu vaccine and shingles vaccine    RTC in 3 months       This note was generated by the [employer] EMR system/Speech recognition and may contain inherent errors or omissions not intended by the user. Grammatical errors, random word insertions, deletions and pronoun errors  are occasional consequences of this technology due to software limitations.   Not all errors are caught or corrected. If there are questions or concerns about the content of this note or information contained within the body  of this dictation they should be addressed directly with the author for  clarification.      Signed by: Nyra Jabs, MD

## 2022-12-26 ENCOUNTER — Telehealth: Payer: BC Managed Care – PPO | Admitting: Internal Medicine

## 2022-12-28 ENCOUNTER — Telehealth: Payer: BC Managed Care – PPO | Admitting: Internal Medicine

## 2022-12-28 ENCOUNTER — Encounter: Payer: Self-pay | Admitting: Internal Medicine

## 2022-12-28 DIAGNOSIS — J208 Acute bronchitis due to other specified organisms: Secondary | ICD-10-CM

## 2022-12-28 HISTORY — DX: Acute bronchitis due to other specified organisms: J20.8

## 2022-12-28 MED ORDER — AZITHROMYCIN 250 MG PO TABS
ORAL_TABLET | ORAL | 0 refills | Status: DC
Start: 2022-12-28 — End: 2023-03-15

## 2022-12-28 MED ORDER — PROMETHAZINE-DM 6.25-15 MG/5ML PO SYRP
5.0000 mL | ORAL_SOLUTION | Freq: Four times a day (QID) | ORAL | 0 refills | Status: DC | PRN
Start: 2022-12-28 — End: 2023-03-15

## 2022-12-28 NOTE — Progress Notes (Signed)
Telemedicine visit      Date Time: 12/28/2022 11:14 AM  Patient Name: [name], [name]   DOB: December 18, 1956    Subjective:   [name] is a 67 y.o. male who presents for an urgent visit  This is a telemedicine visit  Verbal consent was obtained    He is known to have a history of DVT of the right leg twice in 2018, hypertension, left anterior fascicular block, mixed hyperlipidemia, colonic polyps, bilateral nephrolithiasis, history of herpes simplex infection of the penis, history of stage IIIa chronic kidney disease, history of lumbar discectomy, history of vasectomy, major depressive disorder, history of prediabetes    (Historically)  In December 2020 he had a tooth problem and extraction and he was given antibiotics for about a week  Since then he has been having daily 4-5 times diarrhea without blood or mucus but he has some tenesmus he said  I gave him a course of Flagyl this did not help he went to gastroenterology and he had a colonoscopy they found 1 polyp which was not malignant but told him to come back in 10 years  History has intermittent diarrhea and they gave him loperamide    His blood work shows that his cholesterol is high and his blood pressure is still high as well    06/03/2020  He feels very good in himself he has no breathing troubles or chest pains no bowel or urinary problems his medications were reviewed with him he has tolerated the higher dose of the valsartan and its time to check his kidney function    09/09/2020  The gastroenterologist gave him loperamide for his intermittent loose stools and he gave him Metamucil which she has not taken  When he drives the toes in his right foot become a little bit numb and sometimes painful he has to shake his foot that everything is okay he has had DVT in that leg and he was worried  He has been taking his fenofibrate along with rosuvastatin and its time to recheck  He has not been taking Zoloft for his premature ejaculation on a routine basis only as  needed which I told him of the wrong way    01/31/2021  He feels very good in himself has no breathing troubles chest pain bowel or urinary problems  However he complains of tingling and numbness in his foot when he is driving less so otherwise    06/02/2021  He does not have a refill on his Valtrex and unfortunately he has an outbreak and he did not have the medicines  He is trying to exercise and lose weight he is not very successful unfortunately  No changes to the rest of his medications no breathing trouble chest pain no bowel or urinary problems  Medications reviewed and reconciled    09/06/2021  He did see the pain management doctors we did a CAT scan and 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 Loma Newton who referred him to Dr. Doreatha Martin who is a vascular surgeon and  He has no PND orthopnea chest pain or claudication    12/06/2021  He had to go to the emergency room the other day they did find another stone in the ureter he is known to have bilateral kidney stones anyways  He complains of lower buttock pain  when he wakes up in the morning he wants something done about that he had had lumbar spine surgery in the past discectomy in 2011  Medications were reviewed and reconciled    03/07/2022  He did see the nephrologist the urologist and the neurologist  No changes have been made to his medications which were reviewed and reconciled by myself  He continues to complain of 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 I told him really there is not much to do unless he really wants Botox injections    06/06/2022  Unfortunately in spite of Crestor and fenofibrate his cholesterol is still at 168 LDL  No breathing troubles or chest pains no bowel or urinary problems he has no other complaints    10/24/2022  He is here for preop clearance  He is going for an ESWL in a couple  of weeks  Since I saw him last time no new complaints he continues on taking all of his current medications without any problems    12/07/2022  He did have his ESWL and he has a follow-up appointment next week  He continues to complain of the neuropathic pain in his right foot when he is driving or sometimes even at nighttime  Medications all reviewed and reconciled with him    12/28/2022  For about 5 days he has been having a hacking cough runny nose sinus congestion body aches fevers and chills and he tested 3 times for COVID-19 and he was negative over-the-counter medications are not helping very much    Past Medical History:     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    H/O lumbar discectomy 08/12/2018    2011     H/O: vasectomy 08/12/2018    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    Hyperplastic colonic polyp 08/12/2018    2017 return in 5 years    Hypertension     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    Mixed hyperlipidemia 08/12/2018    Nephrolithiasis 08/12/2018    x2 last 2018     Prediabetes 09/06/2021    Premature ejaculation 08/12/2018    Preop examination 09/29/2019    Stage 3a chronic kidney disease 09/09/2020    Throat fullness 08/28/2019       Past Surgical History:     Past Surgical History:   Procedure Laterality Date    BUNIONECTOMY  02/09/2015    COLONOSCOPY  07/2007    VASECTOMY  05/1992       Family History:     Family History   Problem Relation Age of Onset    Stroke Mother        Social History:     Social History     Socioeconomic History    Marital status: Significant Other     Spouse name: Not on file    Number of children: Not on file    Years of education: Not on file    Highest education level: Not  on file   Occupational History    Not on file   Tobacco Use    Smoking status: Never    Smokeless tobacco:  Never   Vaping Use    Vaping Use: Never used   Substance and Sexual Activity    Alcohol use: Yes     Alcohol/week: 7.0 standard drinks of alcohol     Types: 7 Glasses of wine per week     Comment: 1 glass daily    Drug use: No    Sexual activity: Not on file   Other Topics Concern    Not on file   Social History Narrative    Not on file     Social Determinants of Health     Financial Resource Strain: Low Risk  (12/28/2022)    Overall Financial Resource Strain (CARDIA)     Difficulty of Paying Living Expenses: Not very hard   Food Insecurity: No Food Insecurity (12/28/2022)    Hunger Vital Sign     Worried About Running Out of Food in the Last Year: Never true     Ran Out of Food in the Last Year: Never true   Transportation Needs: No Transportation Needs (12/28/2022)    PRAPARE - Therapist, art (Medical): No     Lack of Transportation (Non-Medical): No   Physical Activity: Sufficiently Active (12/28/2022)    Exercise Vital Sign     Days of Exercise per Week: 4 days     Minutes of Exercise per Session: 40 min   Stress: No Stress Concern Present (12/28/2022)    [employer] of Occupational Health - Occupational Stress Questionnaire     Feeling of Stress : Only a little   Social Connections: Socially Isolated (12/28/2022)    Social Connection and Isolation Panel [NHANES]     Frequency of Communication with Friends and Family: Three times a week     Frequency of Social Gatherings with Friends and Family: Once a week     Attends Religious Services: Never     Database administrator or Organizations: No     Attends Banker Meetings: Never     Marital Status: Divorced   Catering manager Violence: Not At Risk (12/28/2022)    Humiliation, Afraid, Rape, and Kick questionnaire     Fear of Current or Ex-Partner: No     Emotionally Abused: No     Physically Abused: No     Sexually Abused: No    Housing Stability: Low Risk  (12/28/2022)    Housing Stability Vital Sign     Unable to Pay for Housing in the Last Year: No     Number of Places Lived in the Last Year: 1     Unstable Housing in the Last Year: No       Allergies:   No Known Allergies    Medications:     Current Outpatient Medications   Medication Sig Dispense Refill    azithromycin (Zithromax Z-Pak) 250 MG tablet Take 2 tablets (500 mg) on  Day 1,  followed by 1 tablet (250 mg) once daily on Days 2 through 5. 6 tablet 0    evolocumab (Repatha SureClick) 140 MG/ML subcutaneous auto-injector INJECT 1 ML INTO THE SKIN EVERY 14 DAYS 1 each 0    fluocinonide (LIDEX) 0.05 % cream Apply topically daily 15 g 1    gabapentin (NEURONTIN) 100 MG capsule Take 1 capsule (100 mg) by mouth 3 (three) times daily as needed (pain) 30 capsule 0    Multiple Vitamins-Minerals (MULTIVITAMIN WITH MINERALS) tablet Take 1 tablet by mouth daily  promethazine-dextromethorphan (PROMETHAZINE-DM) 6.25-15 MG/5ML syrup Take 5 mLs by mouth 4 (four) times daily as needed for Cough 180 mL 0    sertraline (ZOLOFT) 50 MG tablet TAKE 1 TABLET BY MOUTH EVERY DAY 90 tablet 0    valACYclovir (VALTREX) 1000 MG tablet Take 1 tablet (1,000 mg) by mouth 2 (two) times daily 20 tablet 1    valsartan-hydroCHLOROthiazide (DIOVAN-HCT) 320-25 MG per tablet TAKE 1 TABLET BY MOUTH EVERY DAY 90 tablet 1    vitamin D (CHOLECALCIFEROL) 1000 units tablet Take 5 tablets (5,000 Units) by mouth daily      Xarelto 20 MG Tab TAKE 1 TABLET BY MOUTH EVERY DAY WITH DINNER 90 tablet 1     No current facility-administered medications for this visit.       Review of Systems:   A comprehensive review of systems was:   General ROS:   Respiratory ROS: Cough expectoration body aches and pains no wheezing  Cardiovascular ROS: no chest pain or dyspnea on exertion  Gastrointestinal ROS: occasional intermittent diarrhea  Genito-Urinary ROS: no dysuria, trouble voiding, or hematuria. Recurrent herpes simplex  attacks  Musculoskeletal ROS: Bilateral lower buttock pain intermittently  neurological ROS: Intermittent numbness of the right toes while driving mostly but at nighttime also  Dermatologic ROS: Chronic bilateral hand intermittent hyperhidrosis    Physical Exam:     There were no vitals filed for this visit.    BP Readings from Last 3 Encounters:   12/07/22 119/81   10/24/22 134/82   09/07/22 130/82     Wt Readings from Last 3 Encounters:   12/07/22 92.5 kg (204 lb)   10/24/22 94.8 kg (209 lb)   09/07/22 95.7 kg (211 lb)   There is no height or weight on file to calculate BMI.    Only visual examination was conducted during this telemedicine visit    Labs:     Recent Results (from the past 2016 hour(s))   CBC and differential    Collection Time: 10/24/22 10:00 AM   Result Value Ref Range    WBC 3.2 (L) 3.4 - 10.8 x10E3/uL    RBC 5.68 4.14 - 5.80 x10E6/uL    Hemoglobin 17.1 13.0 - 17.7 g/dL    Hematocrit 16.1 09.6 - 51.0 %    MCV 88 79 - 97 fL    MCH 30.1 26.6 - 33.0 pg    MCHC 34.1 31.5 - 35.7 g/dL    RDW 04.5 40.9 - 81.1 %    Platelets 161 150 - 450 x10E3/uL    Neutrophils 36 Not Estab. %    Lymphocytes Automated 52 Not Estab. %    Monocytes 11 Not Estab. %    Eosinophils Automated 1 Not Estab. %    Basophils Automated 0 Not Estab. %    Neutrophils Absolute Count 1.2 (L) 1.4 - 7.0 x10E3/uL    Lymphocytes Absolute 1.7 0.7 - 3.1 x10E3/uL    Monocytes Absolute 0.4 0.1 - 0.9 x10E3/uL    Eosinophils Absolute 0.0 0.0 - 0.4 x10E3/uL    Baso(Absolute) 0.0 0.0 - 0.2 x10E3/uL    Immature Granulocytes 0 Not Estab. %    Immature Granulocytes Absolute 0.0 0.0 - 0.1 x10E3/uL   Basic Metabolic Panel    Collection Time: 10/24/22 10:00 AM   Result Value Ref Range    Glucose 98 70 - 99 mg/dL    BUN 15 8 - 27 mg/dL    Creatinine 9.14 (H) 0.76 - 1.27 mg/dL    eGFR 62 >  59 mL/min/1.73    BUN / Creatinine Ratio 12 10 - 24    Sodium 141 134 - 144 mmol/L    Potassium 4.2 3.5 - 5.2 mmol/L    Chloride 102 96 - 106 mmol/L    CO2 26 20 - 29  mmol/L    Calcium 9.7 8.6 - 10.2 mg/dL   Urinalysis    Collection Time: 10/24/22 10:00 AM   Result Value Ref Range    Specific Gravity UA 1.021 1.005 - 1.030    Urine pH 7.0 5.0 - 7.5    Color, UA Yellow Yellow    Clarity, UA Clear Clear    Leukocyte Esterase, UA Negative Negative    Protein, UR Negative Negative/Trace    Glucose, UA Negative Negative    Ketones UA Negative Negative    Blood, UA Negative Negative    Bilirubin, UA Negative Negative    Urobilinogen, UA 0.2 0.2 - 1.0 mg/dL    Nitrite, UA Negative Negative    Urinalysis Microscopic Comment    Urine culture    Collection Time: 10/24/22 10:00 AM    Specimen: Urine, Clean Catch   Result Value Ref Range    Urine Culture, Routine Final report     Result 1: No growth          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    Hypertension    H/O lumbar discectomy    Bilateral nephrolithiasis    Mixed hyperlipidemia    Premature ejaculation    Herpes simplex infection of penis    LAFB (left anterior fascicular block)    Throat fullness    COVID-19 virus infection    Adenomatous polyp of colon    Functional diarrhea    Major depressive disorder, recurrent episode, mild    Major depressive disorder, recurrent episode, moderate    Recurrent major depression    Stage 3a chronic kidney disease    Prediabetes    Acute bronchitis due to other specified organisms       No orders of the defined types were placed in this encounter.        1. History of recurrent right lower extremity DVT he is on Xarelto and he has intermittent trace edema and chronic varicose dermatitis  I made him a referral for consultation with Dr. 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 Hyperlipidemia Not well controlled he is supposed to be on Crestor 40 and TriCor however he does not have Crestor for quite a while now that he is back on his medications for 3  months with an LDL of 168 we will discontinue both and start him on Repatha 140 mg every 2 weeks if his insurance accepts    3.  Recurrent genital herpes about once a year and he gets Valtrex for 5 days new prescription was given    4.  Hypertension not well-controlled because of poor compliance I admonished his behavior  Valsartan was increased last time to 320/25 and the levels are much better now    5.  History of premature ejaculation and he uses Zoloft for that, And Viagra for erectile dysfunction  He was taking Zoloft as needed I told him he needs to take it every day    6.  History of colonic  polyps last colonoscopy was in 2017 and repeat colonoscopy in 01/2020 if on only 1 polyp nonmalignant told him to come back in 10 years    7.  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  History of kidney stones twice that he can remember drinking plenty of fluids.  Follow-up with urology    9. Erectile dysfunction and he Wanted an increase in the dose of Cialis therefore we gave him 20 mg    10.  Sensation of fullness in the throat status post steroid treatment by ENT however no improvement he was supposed to get a panendoscopy by Dr. Harrold Donath we did not discuss this today    11.  Diarrhea 4-5 times after antibiotic treatment for tooth extraction Flagyl that I gave him did not work he saw gastroenterology they did a colonoscopy that only found 1 polyp him back in 10 years  However he tells me he gets watery diarrhea still intermittently I therefore advised him to go back and talk to the gastroenterologist, which he did and they gave him loperamide and Metamucil strange enough I told him if he is not happy he should find another gastroenterologist    12.  CKD stage III 8 so far he is quite stable avoid nephrotoxins make sure his blood pressure is well controlled.  I would send him for consultation with Dr. Lauralee Evener from nephrology, he continues to follow-up with them and they continue to tell him  everything is stable.  Continue to follow-up with him    13.  Longstanding history of tingling and numbness in his right foot when he is driving less so otherwise I have referred him to neurology for nerve conduction studies but he never went I told him not to worry about it for now I gave him gabapentin to use on a as needed basis    14.  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 Loma Newton who is referring him to Dr. Doreatha Martin from vascular surgery.  He does not remember seeing either one of them we will try and get his report    15.  Bilateral lower buttock pain in a person who had a history of lumbar discectomy in 2011 this happens in the mornings he wanted something done I would send him to neurology Dr. Mikey Kirschner refused MRI of the spine but they did MRI of the buttocks which showed gluteus minimus tendinosis and he was referred to physical therapy    16.  Bilateral nephrolithiasis with intermittent ureteral stones the last 1 was last month November/2022   He did have ESWL recently and he will follow-up with urology next week    17.  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  Vitamin D deficiency recheck next visit And take it from there in the meantime he can stop taking his supplements    19.  Acute bronchitis he tested negative for COVID-19 3 times it has been going on for 5 days he is not feeling any better over-the-counter medications are not helping  Z-Pak along with Phenergan DM along with Allegra-D or Zyrtec-D and nonsteroidals and drink plenty of fluids  He received his COVID-19 immunization  He also received his flu vaccine and shingles vaccine    RTC in 3 months       This note was generated by the Epic  EMR system/Speech recognition and may contain inherent errors or omissions not intended by the user. Grammatical errors, random word insertions, deletions and pronoun errors  are occasional consequences of this technology due to software limitations.   Not all errors are caught or corrected. If there are questions or concerns about the content of this note or information contained within the body of this dictation they should be addressed directly with the author for clarification.      Signed by: Lenise Herald, MD

## 2023-01-14 ENCOUNTER — Other Ambulatory Visit: Payer: Self-pay | Admitting: Internal Medicine

## 2023-01-23 ENCOUNTER — Ambulatory Visit: Payer: BC Managed Care – PPO | Admitting: Internal Medicine

## 2023-03-08 ENCOUNTER — Ambulatory Visit: Payer: BC Managed Care – PPO | Admitting: Internal Medicine

## 2023-03-10 ENCOUNTER — Other Ambulatory Visit: Payer: Self-pay | Admitting: Internal Medicine

## 2023-03-11 ENCOUNTER — Encounter: Payer: Self-pay | Admitting: Internal Medicine

## 2023-03-11 ENCOUNTER — Other Ambulatory Visit: Payer: Self-pay | Admitting: Internal Medicine

## 2023-03-11 DIAGNOSIS — I10 Essential (primary) hypertension: Secondary | ICD-10-CM

## 2023-03-11 DIAGNOSIS — E782 Mixed hyperlipidemia: Secondary | ICD-10-CM

## 2023-03-11 DIAGNOSIS — N1831 Chronic kidney disease, stage 3a: Secondary | ICD-10-CM

## 2023-03-11 DIAGNOSIS — R7303 Prediabetes: Secondary | ICD-10-CM

## 2023-03-13 LAB — URINALYSIS WITH MICROSCOPIC
Bilirubin, UA: NEGATIVE
Blood, UA: NEGATIVE
Glucose, UA: NEGATIVE
Ketones UA: NEGATIVE
Leukocyte Esterase, UA: NEGATIVE
Nitrite, UA: NEGATIVE
Protein, UR: NEGATIVE
Specific Gravity UA: 1.022 (ref 1.005–1.030)
Urine pH: 5.5 (ref 5.0–7.5)
Urobilinogen, UA: 0.2 mg/dL (ref 0.2–1.0)

## 2023-03-13 LAB — CBC AND DIFFERENTIAL
Baso(Absolute): 0 10*3/uL (ref 0.0–0.2)
Basophils Automated: 0 %
Eosinophils Absolute: 0.1 10*3/uL (ref 0.0–0.4)
Eosinophils Automated: 2 %
Hematocrit: 49.3 % (ref 37.5–51.0)
Hemoglobin: 17.2 g/dL (ref 13.0–17.7)
Immature Granulocytes Absolute: 0 10*3/uL (ref 0.0–0.1)
Immature Granulocytes: 0 %
Lymphocytes Absolute: 1.7 10*3/uL (ref 0.7–3.1)
Lymphocytes Automated: 51 %
MCH: 31.2 pg (ref 26.6–33.0)
MCHC: 34.9 g/dL (ref 31.5–35.7)
MCV: 89 fL (ref 79–97)
Monocytes Absolute: 0.4 10*3/uL (ref 0.1–0.9)
Monocytes: 11 %
Neutrophils Absolute Count: 1.2 10*3/uL — ABNORMAL LOW (ref 1.4–7.0)
Neutrophils: 36 %
Platelets: 146 10*3/uL — ABNORMAL LOW (ref 150–450)
RBC: 5.52 x10E6/uL (ref 4.14–5.80)
RDW: 13.1 % (ref 11.6–15.4)
WBC: 3.4 10*3/uL (ref 3.4–10.8)

## 2023-03-13 LAB — IRON DEFICIENCY PROFILE
Ferritin: 99 ng/mL (ref 30–400)
Iron Saturation: 33 % (ref 15–55)
Iron: 95 ug/dL (ref 38–169)
TIBC: 291 ug/dL (ref 250–450)
UIBC: 196 ug/dL (ref 111–343)

## 2023-03-13 LAB — LIPID PANEL
Cholesterol / HDL Ratio: 2.5 ratio (ref 0.0–5.0)
Cholesterol: 208 mg/dL — ABNORMAL HIGH (ref 100–199)
HDL: 83 mg/dL (ref >39)
LDL Chol Calculated (NIH): 110 mg/dL — ABNORMAL HIGH (ref 0–99)
Triglycerides: 87 mg/dL (ref 0–149)
VLDL Calculated: 15 mg/dL (ref 5–40)

## 2023-03-13 LAB — PSA, TOTAL AND FREE
PSA, Free Pct: 27.3 %
PSA, Free: 0.3 ng/mL
Prostate Specific Antigen, Total: 1.1 ng/mL (ref 0.0–4.0)

## 2023-03-13 LAB — HEMOGLOBIN A1C: Hemoglobin A1C: 5.6 % (ref 4.8–5.6)

## 2023-03-13 LAB — BASIC METABOLIC PANEL
BUN / Creatinine Ratio: 13 (ref 10–24)
BUN: 18 mg/dL (ref 8–27)
CO2: 25 mmol/L (ref 20–29)
Calcium: 10.2 mg/dL (ref 8.6–10.2)
Chloride: 105 mmol/L (ref 96–106)
Creatinine: 1.39 mg/dL — ABNORMAL HIGH (ref 0.76–1.27)
Glucose: 108 mg/dL — ABNORMAL HIGH (ref 70–99)
Potassium: 4.2 mmol/L (ref 3.5–5.2)
Sodium: 145 mmol/L — ABNORMAL HIGH (ref 134–144)
eGFR: 56 mL/min/{1.73_m2} — ABNORMAL LOW (ref >59)

## 2023-03-13 LAB — VITAMIN B12: Vitamin B-12: 977 pg/mL (ref 232–1245)

## 2023-03-13 LAB — HEPATIC FUNCTION PANEL
ALT: 26 IU/L (ref 0–44)
AST (SGOT): 29 IU/L (ref 0–40)
Albumin: 4.3 g/dL (ref 3.9–4.9)
Alkaline Phosphatase: 113 IU/L (ref 44–121)
Bilirubin Direct: 0.14 mg/dL (ref 0.00–0.40)
Bilirubin, Total: 0.5 mg/dL (ref 0.0–1.2)
Protein, Total: 7 g/dL (ref 6.0–8.5)

## 2023-03-13 LAB — REFLEX - MICROSCOPIC EXAMINATION
Bacteria, UA: NONE SEEN
Casts, UA: NONE SEEN /lpf
Epithelial Cells (non renal): NONE SEEN /hpf (ref 0–10)
RBC, UA: NONE SEEN /hpf (ref 0–2)
WBC, UA: NONE SEEN /hpf (ref 0–5)

## 2023-03-13 LAB — VITAMIN D,25 OH,TOTAL: Vitamin D 25-Hydroxy: 60.4 ng/mL (ref 30.0–100.0)

## 2023-03-13 LAB — TSH: TSH: 1.49 u[IU]/mL (ref 0.450–4.500)

## 2023-03-15 ENCOUNTER — Encounter: Payer: Self-pay | Admitting: Internal Medicine

## 2023-03-15 ENCOUNTER — Ambulatory Visit: Payer: BC Managed Care – PPO | Admitting: Internal Medicine

## 2023-03-15 VITALS — BP 139/85 | HR 63 | Temp 97.3°F | Ht 70.0 in | Wt 208.0 lb

## 2023-03-15 DIAGNOSIS — Z0000 Encounter for general adult medical examination without abnormal findings: Secondary | ICD-10-CM

## 2023-03-15 DIAGNOSIS — I10 Essential (primary) hypertension: Secondary | ICD-10-CM

## 2023-03-15 DIAGNOSIS — I82411 Acute embolism and thrombosis of right femoral vein: Secondary | ICD-10-CM

## 2023-03-15 DIAGNOSIS — N200 Calculus of kidney: Secondary | ICD-10-CM

## 2023-03-15 DIAGNOSIS — Z9889 Other specified postprocedural states: Secondary | ICD-10-CM

## 2023-03-15 DIAGNOSIS — D126 Benign neoplasm of colon, unspecified: Secondary | ICD-10-CM

## 2023-03-15 DIAGNOSIS — F331 Major depressive disorder, recurrent, moderate: Secondary | ICD-10-CM

## 2023-03-15 DIAGNOSIS — E782 Mixed hyperlipidemia: Secondary | ICD-10-CM

## 2023-03-15 HISTORY — DX: Encounter for general adult medical examination without abnormal findings: Z00.00

## 2023-03-15 NOTE — Progress Notes (Signed)
Annual physical examination      Date Time: 03/15/2023 9:26 AM  Patient Name: [name], [name]   DOB: February 16, 1956    Subjective:   [name] is a 67 y.o. male who presents for his annual physical examination    He is known to have a history of DVT of the right leg twice in 2018, hypertension, left anterior fascicular block, mixed hyperlipidemia, colonic polyps, bilateral nephrolithiasis, history of herpes simplex infection of the penis, history of stage IIIa chronic kidney disease, history of lumbar discectomy, history of vasectomy, major depressive disorder, history of prediabetes    (Historically)  In December 2020 he had a tooth problem and extraction and he was given antibiotics for about a week  Since then he has been having daily 4-5 times diarrhea without blood or mucus but he has some tenesmus he said  I gave him a course of Flagyl this did not help he went to gastroenterology and he had a colonoscopy they found 1 polyp which was not malignant but told him to come back in 10 years  History has intermittent diarrhea and they gave him loperamide    His blood work shows that his cholesterol is high and his blood pressure is still high as well    06/03/2020  He feels very good in himself he has no breathing troubles or chest pains no bowel or urinary problems his medications were reviewed with him he has tolerated the higher dose of the valsartan and its time to check his kidney function    09/09/2020  The gastroenterologist gave him loperamide for his intermittent loose stools and he gave him Metamucil which she has not taken  When he drives the toes in his right foot become a little bit numb and sometimes painful he has to shake his foot that everything is okay he has had DVT in that leg and he was worried  He has been taking his fenofibrate along with rosuvastatin and its time to recheck  He has not been taking Zoloft for his premature ejaculation on a routine basis only as needed which I told him of the  wrong way    01/31/2021  He feels very good in himself has no breathing troubles chest pain bowel or urinary problems  However he complains of tingling and numbness in his foot when he is driving less so otherwise    06/02/2021  He does not have a refill on his Valtrex and unfortunately he has an outbreak and he did not have the medicines  He is trying to exercise and lose weight he is not very successful unfortunately  No changes to the rest of his medications no breathing trouble chest pain no bowel or urinary problems  Medications reviewed and reconciled    09/06/2021  He did see the pain management doctors we did a CAT scan and 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 Raelene Bott who referred him to Dr. Inocente Salles who is a vascular surgeon and  He has no PND orthopnea chest pain or claudication    12/06/2021  He had to go to the emergency room the other day they did find another stone in the ureter he is known to have bilateral kidney stones anyways  He complains of lower buttock pain when he wakes up in the morning he wants  something done about that he had had lumbar spine surgery in the past discectomy in 2011  Medications were reviewed and reconciled    03/07/2022  He did see the nephrologist the urologist and the neurologist  No changes have been made to his medications which were reviewed and reconciled by myself  He continues to complain of sweating in his hands ever since his time in the [place] I told him really there is not much to do unless he really wants Botox injections    06/06/2022  Unfortunately in spite of Crestor and fenofibrate his cholesterol is still at 168 LDL  No breathing troubles or chest pains no bowel or urinary problems he has no other complaints    10/24/2022  He is here for preop clearance  He is going for an ESWL in a couple of weeks  Since I saw him last  time no new complaints he continues on taking all of his current medications without any problems    12/07/2022  He did have his ESWL and he has a follow-up appointment next week  He continues to complain of the neuropathic pain in his right foot when he is driving or sometimes even at nighttime  Medications all reviewed and reconciled with him    12/28/2022  For about 5 days he has been having a hacking cough runny nose sinus congestion body aches fevers and chills and he tested 3 times for COVID-19 and he was negative over-the-counter medications are not helping very much    03/15/2023  This is his annual physical examination  He continues to have some complaints regarding his buttocks his right trochanteric region and his right groin mostly when he sleeps at nighttime  He continues to follow-up with hematology and nephrology  No breathing troubles or chest pains no bowel or urinary problems  Medications reviewed and reconciled  Unfortunately the ESWL that he had for his renal stone failed they offered him surgery and he declined    Past Medical History:     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    H/O lumbar discectomy 08/12/2018    2011     H/O: vasectomy 08/12/2018    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    Hyperplastic colonic polyp 08/12/2018    2017 return in 5 years    Hypertension     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    Mixed hyperlipidemia 08/12/2018    Nephrolithiasis 08/12/2018    x2 last 2018     Prediabetes 09/06/2021    Premature ejaculation 08/12/2018    Preop examination 09/29/2019    Routine history and physical examination of adult 03/15/2023    Stage 3a chronic kidney disease 09/09/2020    Throat  fullness 08/28/2019       Past Surgical History:     Past Surgical History:   Procedure Laterality Date    BUNIONECTOMY  02/09/2015    COLONOSCOPY  07/2007    VASECTOMY  05/1992       Family History:     Family History   Problem Relation Age of Onset    Stroke Mother  Social History:     Social History     Socioeconomic History    Marital status: Significant Other     Spouse name: None    Number of children: None    Years of education: None    Highest education level: None   Occupational History    None   Tobacco Use    Smoking status: Never    Smokeless tobacco: Never   Vaping Use    Vaping status: Never Used   Substance and Sexual Activity    Alcohol use: Yes     Alcohol/week: 7.0 standard drinks of alcohol     Types: 7 Glasses of wine per week     Comment: 1 glass daily    Drug use: No    Sexual activity: None   Other Topics Concern    None   Social History Narrative    None     Social Determinants of Health     Financial Resource Strain: Medium Risk (03/11/2023)    Overall Financial Resource Strain (CARDIA)     Difficulty of Paying Living Expenses: Somewhat hard   Food Insecurity: No Food Insecurity (03/11/2023)    Hunger Vital Sign     Worried About Running Out of Food in the Last Year: Never true     Ran Out of Food in the Last Year: Never true   Transportation Needs: No Transportation Needs (03/11/2023)    PRAPARE - Armed forces logistics/support/administrative officer (Medical): No     Lack of Transportation (Non-Medical): No   Physical Activity: Insufficiently Active (03/11/2023)    Exercise Vital Sign     Days of Exercise per Week: 3 days     Minutes of Exercise per Session: 40 min   Stress: Stress Concern Present (03/11/2023)    [place]     Feeling of Stress : To some extent   Social Connections: Moderately Isolated (03/11/2023)    Social Connection and Isolation Panel [NHANES]     Frequency of Communication with Friends and Family: More than three  times a week     Frequency of Social Gatherings with Friends and Family: Once a week     Attends Religious Services: Never     Marine scientist or Organizations: No     Attends Archivist Meetings: Never     Marital Status: Living with partner   Intimate Partner Violence: Not At Risk (03/11/2023)    Humiliation, Afraid, Rape, and Kick questionnaire     Fear of Current or Ex-Partner: No     Emotionally Abused: No     Physically Abused: No     Sexually Abused: No   Housing Stability: Low Risk  (03/11/2023)    Housing Stability Vital Sign     Unable to Pay for Housing in the Last Year: No     Number of Places Lived in the Last Year: 1     Unstable Housing in the Last Year: No       Allergies:   No Known Allergies    Medications:     Current Outpatient Medications   Medication Sig Dispense Refill    evolocumab (Repatha SureClick) [id number] MG/ML subcutaneous auto-injector INJECT 1 ML INTO THE SKIN EVERY 14 DAYS 6 each 0    Multiple Vitamins-Minerals (MULTIVITAMIN WITH MINERALS) tablet Take 1 tablet by mouth daily      sertraline (ZOLOFT) 50 MG tablet TAKE 1  TABLET BY MOUTH EVERY DAY 90 tablet 0    valACYclovir (VALTREX) 1000 MG tablet Take 1 tablet (1,000 mg) by mouth 2 (two) times daily 20 tablet 1    valsartan-hydroCHLOROthiazide (DIOVAN-HCT) 320-25 MG per tablet TAKE 1 TABLET BY MOUTH EVERY DAY 90 tablet 1    vitamin D (CHOLECALCIFEROL) 1000 units tablet Take 5 tablets (5,000 Units) by mouth daily      Xarelto 20 MG Tab TAKE 1 TABLET BY MOUTH EVERY DAY WITH DINNER 90 tablet 1     No current facility-administered medications for this visit.       Review of Systems:   A comprehensive review of systems was:   General ROS:   Respiratory ROS: No cough shortness of breath or wheezing   cardiovascular ROS: no chest pain or dyspnea on exertion  Gastrointestinal ROS: occasional intermittent diarrhea  Genito-Urinary ROS: no dysuria, trouble voiding, or hematuria. Recurrent herpes simplex attacks  Musculoskeletal ROS:  Bilateral lower buttock pain intermittently  neurological ROS: Intermittent numbness of the right toes while driving mostly but at nighttime also  Dermatologic ROS: Chronic bilateral hand intermittent hyperhidrosis    Physical Exam:     Vitals:    03/15/23 0840   BP: 139/85   Pulse: 63   Temp: 97.3 F (36.3 C)   SpO2: 97%       BP Readings from Last 3 Encounters:   03/15/23 139/85   12/07/22 119/81   10/24/22 134/82     Wt Readings from Last 3 Encounters:   03/15/23 94.3 kg (208 lb)   12/07/22 92.5 kg (204 lb)   10/24/22 94.8 kg (209 lb)   Body mass index is 29.84 kg/m.    General appearance - alert, well appearing, and in no distress  Chest - clear to auscultation, no wheezes, rales or rhonchi, symmetric air entry  Heart - normal rate, regular rhythm, normal S1, S2, no murmurs, rubs, clicks or gallops  Abdomen - soft, nontender, nondistended, no masses or organomegaly  Neurological - alert, oriented, normal speech, no focal findings or movement disorder noted  Extremities - peripheral pulses normal,  no clubbing or cyanosis   trace edema and vertical dermatitis around the right ankle     ECG shows normal sinus rhythm    Labs:     Recent Results (from the past 2016 hour(s))   Basic Metabolic Panel    Collection Time: 03/12/23  8:20 AM   Result Value Ref Range    Glucose 108 (H) 70 - 99 mg/dL    BUN 18 8 - 27 mg/dL    Creatinine 1.39 (H) 0.76 - 1.27 mg/dL    eGFR 56 (L) >59 mL/min/1.73    BUN / Creatinine Ratio 13 10 - 24    Sodium 145 (H) 134 - 144 mmol/L    Potassium 4.2 3.5 - 5.2 mmol/L    Chloride 105 96 - 106 mmol/L    CO2 25 20 - 29 mmol/L    Calcium 10.2 8.6 - 10.2 mg/dL   CBC and differential    Collection Time: 03/12/23  8:20 AM   Result Value Ref Range    WBC 3.4 3.4 - 10.8 x10E3/uL    RBC 5.52 4.14 - 5.80 x10E6/uL    Hemoglobin 17.2 13.0 - 17.7 g/dL    Hematocrit 49.3 37.5 - 51.0 %    MCV 89 79 - 97 fL    MCH 31.2 26.6 - 33.0 pg    MCHC 34.9 31.5 - 35.7 g/dL  RDW 13.1 11.6 - 15.4 %    Platelets 146 (L)  150 - 450 x10E3/uL    Neutrophils 36 Not Estab. %    Lymphocytes Automated 51 Not Estab. %    Monocytes 11 Not Estab. %    Eosinophils Automated 2 Not Estab. %    Basophils Automated 0 Not Estab. %    Neutrophils Absolute Count 1.2 (L) 1.4 - 7.0 x10E3/uL    Lymphocytes Absolute 1.7 0.7 - 3.1 x10E3/uL    Monocytes Absolute 0.4 0.1 - 0.9 x10E3/uL    Eosinophils Absolute 0.1 0.0 - 0.4 x10E3/uL    Baso(Absolute) 0.0 0.0 - 0.2 x10E3/uL    Immature Granulocytes 0 Not Estab. %    Immature Granulocytes Absolute 0.0 0.0 - 0.1 x10E3/uL   Hemoglobin A1C    Collection Time: 03/12/23  8:20 AM   Result Value Ref Range    Hemoglobin A1C 5.6 4.8 - 5.6 %   Hepatic function panel (LFT)    Collection Time: 03/12/23  8:20 AM   Result Value Ref Range    Protein, Total 7.0 6.0 - 8.5 g/dL    Albumin 4.3 3.9 - 4.9 g/dL    Bilirubin, Total 0.5 0.0 - 1.2 mg/dL    Bilirubin Direct 0.14 0.00 - 0.40 mg/dL    Alkaline Phosphatase 113 44 - 121 IU/L    AST (SGOT) 29 0 - 40 IU/L    ALT 26 0 - 44 IU/L   Iron Deficiency Profile    Collection Time: 03/12/23  8:20 AM   Result Value Ref Range    TIBC 291 250 - 450 ug/dL    UIBC 196 111 - 343 ug/dL    Iron 95 38 - 169 ug/dL    Iron Saturation 33 15 - 55 %    Ferritin 99 30 - 400 ng/mL   Lipid panel    Collection Time: 03/12/23  8:20 AM   Result Value Ref Range    Cholesterol 208 (H) 100 - 199 mg/dL    Triglycerides 87 0 - 149 mg/dL    HDL 83 >39 mg/dL    VLDL Calculated 15 5 - 40 mg/dL    LDL Chol Calculated (NIH) 110 (H) 0 - 99 mg/dL    Cholesterol / HDL Ratio 2.5 0.0 - 5.0 ratio   PSA, total and free    Collection Time: 03/12/23  8:20 AM   Result Value Ref Range    Prostate Specific Antigen, Total 1.1 0.0 - 4.0 ng/mL    PSA, Free 0.30 N/A ng/mL    PSA, Free Pct 27.3 %   TSH    Collection Time: 03/12/23  8:20 AM   Result Value Ref Range    TSH 1.490 0.450 - 4.500 uIU/mL   Urinalysis with microscopic    Collection Time: 03/12/23  8:20 AM   Result Value Ref Range    Specific Gravity UA 1.022 1.005 - 1.030     Urine pH 5.5 5.0 - 7.5    Color, UA Yellow Yellow    Clarity, UA Clear Clear    Leukocyte Esterase, UA Negative Negative    Protein, UR Negative Negative/Trace    Glucose, UA Negative Negative    Ketones UA Negative Negative    Blood, UA Negative Negative    Bilirubin, UA Negative Negative    Urobilinogen, UA 0.2 0.2 - 1.0 mg/dL    Nitrite, UA Negative Negative    Urinalysis Microscopic Comment     Microscopic Examination: See below:  Vitamin B12    Collection Time: 03/12/23  8:20 AM   Result Value Ref Range    Vitamin B-12 977 232 - 1,245 pg/mL   Vitamin D,25 OH, Total    Collection Time: 03/12/23  8:20 AM   Result Value Ref Range    Vitamin D 25-Hydroxy 60.4 30.0 - 100.0 ng/mL   Reflex - Microscopic Examination    Collection Time: 03/12/23  8:20 AM   Result Value Ref Range    WBC, UA None seen 0 - 5 /hpf    RBC, UA None seen 0 - 2 /hpf    Epithelial Cells (non renal) None seen 0 - 10 /hpf    Casts, UA None seen None seen /lpf    Bacteria, UA None seen None seen/Few         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    Hypertension    H/O lumbar discectomy    Bilateral nephrolithiasis    Mixed hyperlipidemia    Premature ejaculation    Herpes simplex infection of penis    LAFB (left anterior fascicular block)    Throat fullness    COVID-19 virus infection    Adenomatous polyp of colon    Functional diarrhea    Major depressive disorder, recurrent episode, mild    Major depressive disorder, recurrent episode, moderate    Recurrent major depression    Stage 3a chronic kidney disease    Prediabetes    Acute bronchitis due to other specified organisms    Routine history and physical examination of adult       No orders of the defined types were placed in this encounter.        1. History of recurrent right lower extremity DVT he is on Xarelto and he has intermittent trace edema and chronic  varicose dermatitis  I made him a referral for consultation with Dr. Halford Chessman from hematology  I told him this is lifelong    2. Hyperlipidemia Not well controlled he is supposed to be on Crestor 40 and TriCor however he does not have Crestor for quite a while now that he is back on his medications for 3 months with an LDL of 168 we will discontinue both and start him on Repatha 140 mg every 2 weeks if his insurance accepts  And his lipid profile is excellent    3.  Recurrent genital herpes about once a year and he gets Valtrex for 5 days new prescription was given    4.  Hypertension not well-controlled because of poor compliance I admonished his behavior  Valsartan was increased last time to 320/25 and the levels are much better now    5.  History of premature ejaculation and he uses Zoloft for that, And Viagra for erectile dysfunction  He was taking Zoloft as needed I told him he needs to take it every day    6.  History of colonic polyps last colonoscopy was in 2017 and repeat colonoscopy in 01/2020 if on only 1 polyp nonmalignant told him to come back in 10 years    7.  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  History of kidney stones twice that he can remember drinking plenty of fluids.  Follow-up with urology    9. Erectile dysfunction  and he Wanted an increase in the dose of Cialis therefore we gave him 20 mg    10.  Sensation of fullness in the throat status post steroid treatment by ENT however no improvement he was supposed to get a panendoscopy by Dr. Ovid Curd we did not discuss this today    98.  Diarrhea 4-5 times after antibiotic treatment for tooth extraction Flagyl that I gave him did not work he saw gastroenterology they did a colonoscopy that only found 1 polyp him back in 10 years  However he tells me he gets watery diarrhea still intermittently I therefore advised him to go back and talk to the gastroenterologist, which he did and they gave him loperamide and Metamucil strange  enough I told him if he is not happy he should find another gastroenterologist    12.  CKD stage III 8 so far he is quite stable avoid nephrotoxins make sure his blood pressure is well controlled.  I would send him for consultation with Dr. Rosealee Albee from nephrology, he continues to follow-up with them and they continue to tell him everything is stable.  Continue to follow-up with him    13.  Longstanding history of tingling and numbness in his right foot when he is driving less so otherwise I have referred him to neurology for nerve conduction studies but he never went I told him not to worry about it for now I gave him gabapentin to use on a as needed basis    14.  Aortic infrarenal dissection he thinks that this is longstanding ever since he was a patient with [facility] he was seen by Dr. Raelene Bott who is referring him to Dr. Inocente Salles from vascular surgery.  He does not remember seeing either one of them we will try and get his report    15.  Bilateral lower buttock pain in a person who had a history of lumbar discectomy in 2011 this happens in the mornings he wanted something done I would send him to neurology Dr. Jeb Levering refused MRI of the spine but they did MRI of the buttocks which showed gluteus minimus tendinosis and he was referred to physical therapy    16.  Bilateral nephrolithiasis with intermittent ureteral stones the last 1 was last month November/2022   He did have ESWL recently and he will follow-up with urology next week    17.  Chronic intermittent bilateral hand hyperhidrosis ever since his time in the [employer] we talked about Botox injections I told him this is really not needed at this moment in time he said he will talk to the [place] maybe he can get some disability benefits    18.  Vitamin D deficiency recheck next visit And take it from there in the meantime he can stop taking his supplements    19.  Acute bronchitis he tested negative for COVID-19 3 times it has been going on for 5 days he is  not feeling any better over-the-counter medications are not helping  Z-Pak along with Phenergan DM along with Allegra-D or Zyrtec-D and nonsteroidals and drink plenty of fluids    He received his COVID-19 immunization  He also received his flu vaccine and shingles vaccine    RTC in 3 months       This note was generated by the Epic EMR system/Speech recognition and may contain inherent errors or omissions not intended by the user. Grammatical errors, random word insertions, deletions and pronoun errors  are occasional consequences  of this technology due to software limitations.   Not all errors are caught or corrected. If there are questions or concerns about the content of this note or information contained within the body of this dictation they should be addressed directly with the author for clarification.      Signed by: Lenise Herald, MD

## 2023-04-07 ENCOUNTER — Other Ambulatory Visit: Payer: Self-pay | Admitting: Internal Medicine

## 2023-04-10 ENCOUNTER — Encounter (HOSPITAL_BASED_OUTPATIENT_CLINIC_OR_DEPARTMENT_OTHER): Payer: Self-pay | Admitting: Physician Assistant

## 2023-04-10 ENCOUNTER — Ambulatory Visit (HOSPITAL_BASED_OUTPATIENT_CLINIC_OR_DEPARTMENT_OTHER): Payer: BC Managed Care – PPO | Admitting: Physician Assistant

## 2023-04-10 VITALS — BP 127/84 | HR 55 | Temp 97.6°F | Resp 16 | Ht 70.0 in | Wt 207.6 lb

## 2023-04-10 DIAGNOSIS — G8929 Other chronic pain: Secondary | ICD-10-CM

## 2023-04-10 DIAGNOSIS — R202 Paresthesia of skin: Secondary | ICD-10-CM

## 2023-04-10 DIAGNOSIS — R200 Anesthesia of skin: Secondary | ICD-10-CM

## 2023-04-10 DIAGNOSIS — M545 Low back pain, unspecified: Secondary | ICD-10-CM

## 2023-04-10 DIAGNOSIS — M7918 Myalgia, other site: Secondary | ICD-10-CM

## 2023-04-10 NOTE — Progress Notes (Signed)
[facility] Follow up Visit    Subjective:      HPI  The patient is a 67 y.o., male, who presents for follow-up for bilateral lower back and buttocks pain.     He has been experiencing an exacerbation of his bilateral low back pain over the past few months. He describes the pain as a throbbing and radiates down both buttocks. He has tingling of the toes on the right side. He denies any weakness of the legs.   He has been using a Frankincense/Myrrh neuropathic oil for pain which has helped some.   He was prescribed gabapentin by his primary care doctor but did not notice a significant improvement so he stopped the medication.   He had previous MRI pelvis which showed bilateral gluteus minimus tendinosis.   Previously, his MRI of lumbar spine was denied by insurance.  He has history of prior lumbar spine fusion in 2011.   He was referred for physical therapy but was not able to start it yet. He is open to starting it now.    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     Patient was last seen on 01/19/22 by Dr. Chales Abrahams.     From previous visit:    Patient was initially seen on 12/08/2021 by Dr. Lynann Bologna.  Denied lumbar spine MRI by insurance, approved Pelvic MRI.  Bilateral hip pain started around September 2022.  Overall less pain, but worse with certain movements.   Laying in certain positions exacerbates pain.   Toes on right foot go numb, very painful, especially when driving - intense. Had been going on prior to September.  Mainly pain at base of buttocks.  Better with movement.  Feels ok when goes to bed, feels intense pain upon waking. Uses topical oil, which helps most of the day.   Of note, had lumber disectomy in 2011.     From initial visit:  The patient is a pleasant 67 years old right-handed male who is here for evaluation of bilateral hip pain for the last 3 months.  The patient reports that the pain is mainly in the back, buttocks that seems to shoot down the back of  the upper thigh.  It is unpredictable will, may occur without any trigger on waking up maybe 2-3 times a week and may last for few hours.  He also feels tingling and numbness in the toes especially while lying down or while driving the car, otherwise no other paresthesias.  His partner has brought some local ointment that seems to help with the pain, he has not tried taking any Motrin Advil or any other pain medication.  He denies having any weakness in the leg, he does have history of lumbar spine surgery many years ago, he is worried whether he has gotten another herniated disc that may be causing the symptoms.  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  He denies having any trauma, injury or any fall that may have brought on the symptoms.       The following portions of the patient's history were reviewed and updated as appropriate: allergies, current medications, past family history, past medical history, past social history, past surgical history, and problem list.    Review of Systems  As above.  Current Outpatient Medications on File Prior to Visit   Medication Sig Dispense Refill    Cholecalciferol (vitamin D3) 125 MCG (5000 UT) tablet  Take 1 tablet (5,000 Units) by mouth daily      evolocumab (Repatha SureClick) 140 MG/ML subcutaneous auto-injector INJECT 1 ML INTO THE SKIN EVERY 14 DAYS 6 each 0    loperamide (IMODIUM) 2 MG capsule Take 1 capsule (2 mg) by mouth as needed for Diarrhea      Multiple Vitamins-Minerals (MULTIVITAMIN WITH MINERALS) tablet Take 1 tablet by mouth daily      sertraline (ZOLOFT) 50 MG tablet TAKE 1 TABLET BY MOUTH EVERY DAY (Patient taking differently: Take 1 tablet (50 mg) by mouth as needed) 90 tablet 0    valACYclovir (VALTREX) 1000 MG tablet Take 1 tablet (1,000 mg) by mouth 2 (two) times daily (Patient taking differently: Take 1 tablet (1,000 mg) by mouth as needed) 20 tablet 1    valsartan-hydroCHLOROthiazide (DIOVAN-HCT) 320-25 MG per  tablet TAKE 1 TABLET BY MOUTH EVERY DAY 90 tablet 1    Xarelto 20 MG Tab TAKE 1 TABLET BY MOUTH EVERY DAY WITH DINNER 90 tablet 1     No current facility-administered medications on file prior to visit.       Objective:     Vitals:    04/10/23 1147 04/10/23 1151   BP: 127/84    BP Site: Left arm    Patient Position: Sitting    Cuff Size: Large    Pulse: (!) 53 (!) 55   Resp: 16    Temp: 97.6 F (36.4 C)    TempSrc: Temporal    SpO2: 99%    Weight: 94.2 kg (207 lb 9.6 oz)    Height: 1.778 m (5\' 10" )      Body mass index is 29.79 kg/m.    Mental Status: The patient is awake, alert, and oriented. Orlene Erm is appropriate. Speech is fluent, and the patient follows commands consistently.  Cranial Nerves: Cranial nerves II-XII grossly intact. The patient's face is symmetric. Hearing intact to conversational speech. Tongue protrudes midline and palate elevates symmetrically.  Shoulder shrug symmetrically intact.  Motor Exam: The patient has full strength throughout the proximal and distal muscles of the arms and legs. There is pain elicited in the right hip and groin upon hip flexion. Tone and bulk appear normal. There are no abnormal movements or tremor noted.   Sensation: Light touch is intact.   Coordination: Finger-to-nose-to-finger testing is intact without dysmetria. There is no Romberg sign.   Station and Gait: Normal, with normal tandem walking.       Data Review:    MRI Pelvis WO Contrast [[id number]] (Order [id number])  Status: Final result     Study Result    Narrative & Impression   CLINICAL INDICATION:  Bilateral buttock pain     TECHNIQUE: Multiplanar multisequence noncontrast MRI of the pelvis  obtained.     COMPARISON: None available.     INTERPRETATION: No suspicious marrow signal abnormality. No evidence for  acute fracture or AVN. Left-sided hemisacralization of the L5 vertebra.  Disc desiccation in the lower lumbar spine. No hip joint effusions.  Trace fluid in the trochanteric bursa bilaterally. T2  hyperintense  signal surrounds the gluteus minimus tendons bilaterally. Gluteus  minimus, gluteus medius, hamstring, iliopsoas, rectus femoris tendons  intact. No abnormal muscle signal intensity. No soft tissue mass or  fluid collection. Minimal pubic symphysis arthropathy. Visualized  sciatic nerves unremarkable.     IMPRESSION:       1. Bilateral gluteus minimus tendinosis.  2. 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           Mitali Bapna, MD   01/10/2022 8:07 AM     Assessment & Plan:      Diagnosis ICD-10-CM Associated Order   1. Chronic bilateral low back pain without sciatica  M54.50 Referral to Physical Therapy - EXTERNAL    G89.29 MRI Lumbar Spine WO Contrast      2. Buttock pain  M79.18 Referral to Physical Therapy - EXTERNAL     MRI Lumbar Spine WO Contrast      3. Numbness and tingling of right lower extremity  R20.0 MRI Lumbar Spine WO Contrast    R20.2         -Will attempt to authorize MRI Lumbar Spine WO for further evaluation of his acute on chronic bilateral low back pain. This test is medically necessary due to his prolonged symptoms and prior history of spinal surgery.  -Referral for PT provided to assist with lower back/hip flexor strengthening   -Pt prefers to avoid prescription medications and stick with naturopathic treatments. He did not have much of a response to Gabapentin prescribed by his PCP. Recommend tylenol 1,000mg  up to TID PRN for pain control.       The patient should return for follow-up in 3-4 months with Dr. Chales Abrahams or earlier, if needed.    Helyn App, Danny Lawless, PA-C  [facility]     Phone #: [phone]  Fax #: [phone] [place])  [address]  [place], [place]. [address]       Dr. Hamilton Capri was available in a supervisory capacity.

## 2023-04-10 NOTE — Patient Instructions (Signed)
Our plan:     -Will attempt to authorize MRI Lumbar Spine WO for further evaluation of his acute on chronic bilateral low back pain. This test is medically necessary due to his prolonged symptoms and prior history of spinal surgery.  -Referral for PT provided to assist with lower back/hip flexor strengthening   -Pt prefers to avoid prescription medications and stick with naturopathic treatments. He did not have much of a response to Gabapentin prescribed by his PCP. Recommend tylenol 1,000mg  up to TID PRN for pain control.       The patient should return for follow-up in 3-4 months with Dr. Chales Abrahams or earlier, if needed.        Today's Visit:      In today's visit, I reviewed your medications and records relating your health - prior testing, blood work, reports of other health care providers present in your electronic medical record.     If you have pertinent records from any non-[facility] doctors that you would like to review, please have them sent to [place] or bring to them to your next office visit.     A copy of today's visit will be sent to your referring doctor and/or primary care doctor, if you have one listed in our system.    Let me know if there are things we could have done better for your office visit.    Patient satisfaction survey:      If you receive a patient satisfaction survey, I would greatly appreciate it if you would complete it. We value your feedback.     Contact me online:      Patient Portal online - Please sign up for MyChart -- this is the best way to communicate with your team here.  There is a messaging feature, where you can [place] messages anytime of day.  It is the best way to communicate with [place] and get test results, medication refills, or ask questions.     You can expect to get a response within 24-48 hours during weekdays.  If you do not receive a timely response, resend your request and inform us. My goal is for every question answered ever day.  Average response for a phone call maybe 3 days  due to the volume we receive (which is why MyChart is preferred).    If you are having a medical emergency -- call 911, DO NOT SEND A MESSAGE THROUGH MYCHART.     Coupons for medication:      If you have any trouble affording your medications, check out www.goodrx.com for coupons and competitive prices in your area.  If you need further assistance, let us know so we can work with you and your insurance to make sure you get the best care.    Thank you for trusting me with your health.      Take care,      Helyn App, MPAS, PA-C  [facility]

## 2023-04-17 ENCOUNTER — Other Ambulatory Visit: Payer: Self-pay | Admitting: Urology

## 2023-04-17 DIAGNOSIS — N200 Calculus of kidney: Secondary | ICD-10-CM

## 2023-04-19 ENCOUNTER — Ambulatory Visit
Admission: RE | Admit: 2023-04-19 | Discharge: 2023-04-19 | Disposition: A | Discharge status: Home / Self Care | Payer: BC Managed Care – PPO | Source: Ambulatory Visit | Attending: Urology | Admitting: Urology

## 2023-04-19 DIAGNOSIS — N200 Calculus of kidney: Secondary | ICD-10-CM

## 2023-05-14 ENCOUNTER — Other Ambulatory Visit (INDEPENDENT_AMBULATORY_CARE_PROVIDER_SITE_OTHER): Payer: Self-pay | Admitting: Physician Assistant

## 2023-05-16 ENCOUNTER — Telehealth (HOSPITAL_BASED_OUTPATIENT_CLINIC_OR_DEPARTMENT_OTHER): Payer: BC Managed Care – PPO | Admitting: Physician Assistant

## 2023-05-29 ENCOUNTER — Encounter (HOSPITAL_BASED_OUTPATIENT_CLINIC_OR_DEPARTMENT_OTHER): Payer: Self-pay | Admitting: Neurology

## 2023-05-29 ENCOUNTER — Telehealth (INDEPENDENT_AMBULATORY_CARE_PROVIDER_SITE_OTHER): Payer: BC Managed Care – PPO | Admitting: Neurology

## 2023-05-29 VITALS — Ht 70.0 in | Wt 201.0 lb

## 2023-05-29 DIAGNOSIS — M545 Low back pain, unspecified: Secondary | ICD-10-CM

## 2023-05-29 DIAGNOSIS — G8929 Other chronic pain: Secondary | ICD-10-CM

## 2023-05-29 MED ORDER — GABAPENTIN 300 MG PO CAPS
300.0000 mg | ORAL_CAPSULE | Freq: Three times a day (TID) | ORAL | 2 refills | Status: DC
Start: 2023-05-29 — End: 2023-12-26

## 2023-05-29 NOTE — Progress Notes (Signed)
Subjective:      Patient ID: [name] is a 67 y.o. male.    HPI    The patient is a pleasant 67 years old right-handed male who is here for follow-up.  He was seen initially for evaluation of bilateral hip pain for the last 3 months.  He was seen by Rosie Fate, our nurse practitioner and recommended MRI of the lumbar spine and physical therapy.  The pain has been persistent some days are worse than others including tingling numbness and paresthesias.  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    Prior note: The patient reports that the pain is mainly in the back, buttocks that seems to shoot down the back of the upper thigh.  It is unpredictable will, may occur without any trigger on waking up maybe 2-3 times a week and may last for few hours.  He also feels tingling and numbness in the toes especially while lying down or while driving the car, otherwise no other paresthesias.  His partner has brought some local ointment that seems to help with the pain, he has not tried taking any Motrin Advil or any other pain medication.  He denies having any weakness in the leg, he does have history of lumbar spine surgery many years ago, he is worried whether he has gotten another herniated disc that may be causing the symptoms.  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  He denies having any trauma, injury or any fall that may have brought on the symptoms.    No Known Allergies    The following portions of the patient's history were reviewed and updated as appropriate: allergies, current medications, past family history, past medical history, past social history, past surgical history, and problem list.    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    Mixed hyperlipidemia 08/12/2018    Nephrolithiasis 08/12/2018    x2 last 2018     Prediabetes 09/06/2021    Premature ejaculation 08/12/2018    Preop examination 09/29/2019    Routine history and physical examination of adult 03/15/2023    Stage 3a chronic kidney disease 09/09/2020    Throat fullness 08/28/2019     Current Outpatient Medications on File Prior to Visit   Medication Sig Dispense Refill    Cholecalciferol (vitamin D3) 125 MCG (5000 UT) tablet Take 1 tablet (5,000 Units) by mouth daily      evolocumab (Repatha SureClick) 140 MG/ML subcutaneous auto-injector INJECT 1 ML INTO THE SKIN EVERY 14 DAYS (Patient taking differently: Inject 1 mL (140 mg) into the skin every 14 (fourteen) days INJECT 1 ML INTO THE SKIN EVERY 14 DAYS) 6 each 0    Multiple Vitamins-Minerals (MULTIVITAMIN WITH MINERALS) tablet Take 1 tablet by mouth daily  valACYclovir (VALTREX) 1000 MG tablet Take 1 tablet (1,000 mg) by mouth 2 (two) times daily (Patient taking differently: Take 1 tablet (1,000 mg) by mouth as needed) 20 tablet 1    valsartan-hydroCHLOROthiazide (DIOVAN-HCT) 320-25 MG per tablet TAKE 1 TABLET BY MOUTH EVERY DAY 90 tablet 1    Xarelto 20 MG Tab TAKE 1 TABLET BY MOUTH EVERY DAY WITH DINNER 90 tablet 1    loperamide (IMODIUM) 2 MG capsule Take 1 capsule (2 mg) by mouth as needed for Diarrhea      sertraline (ZOLOFT) 50 MG tablet TAKE 1 TABLET BY MOUTH EVERY DAY (Patient taking differently: Take 1 tablet (50 mg) by mouth as needed) 90 tablet 0     No current facility-administered medications on file prior to visit.     Review of Systems    Constitutional:  Positive for activity change and fatigue.   HENT: Negative.     Eyes: Negative.    Respiratory: Negative.     Cardiovascular: Negative.    Gastrointestinal: Negative.    Endocrine: Negative.    Genitourinary: Negative.    Musculoskeletal:  Positive for gait problem.   Skin: Negative.    Allergic/Immunologic: Negative.    Neurological:  Positive for numbness. Negative for weakness.   Hematological: Negative.    Psychiatric/Behavioral:  Positive for decreased concentration and sleep disturbance. The patient is nervous/anxious.          Family History   Problem Relation Age of Onset    Stroke Mother      Objective:     Vitals:    05/29/23 1526   Weight: 91.2 kg (201 lb)   Height: 1.778 m (5\' 10" )     Neurologic Exam    Constitutional: Vital signs reviewed.   Head: Normocephalic, atraumatic, neck supple no JVD  Eyes: No conjunctival injection. No discharge.   ENT: Mucous membranes moist   Neck: Normal range of motion. Non tender.   Respiratory/Chest: Clear to auscultation. No respiratory distress.   Cardiovascular: Regular rate and rhythm. No murmur.   Lower Extremity: No edema. No cyanosis.   Skin: Warm and dry. No rash.   Lymphatic: No cervical lymphadenopathy.   Psychiatric: Normal affect.     Mental Status: The patient was awake, alert, and oriented. Conversation   was appropriate. Speech was fluent, and the patient followed commands   consistently.  Cranial Nerves: Pupils were equally round and reactive to light. Extraocular  movements were intact, without nystagmus. The patient's face was symmetric,   and facial sensation was intact. Tongue and palate were midline.   Motor Exam: The patient had full strength throughout. Tone and bulk appeared   normal. There were no abnormal movements or tremor noted.   Sensation: Decreased light touch, pinprick and vibration in the lower extremities distally  Coordination: Finger-to-nose and heel-to-shin testing was intact   without dysmetria. Romberg testing  was negative.   Gait: Normal, with normal tandem walking.   Deep Tendon Reflexes: 2+ and symmetric throughout.   Toes were downgoing.    MRI Lumbar spine:  1.  Severe spinal canal stenosis at L3-L4 level due to combination of  severe facet arthropathy and ligamentum flavum thickening, prominent dorsal  epidural fat, and mild disc bulge.  2.  Transitional L5. There is an enlarged left L5 transverse process  which articulates with the sacrum. This pseudoarticulation joint exerts  mass effect on the extraforaminal left L5 nerve root.  3.  Additional degenerative changes as detailed by level  above.    Assessment:     67 years old male presenting with bilateral hip pain with mild radiation to the back of the thighs, history of lumbar discectomy and sciatica  Worsening pain with severe spinal stenosis noted at recent MRI lumbar spine, he may benefit with neuropathic pain medication as well as neurosurgical evaluation  History of DVT on anticoagulation  Stress/anxiety and depression  Chronic kidney disease  Prediabetes      Plan:     Will start gabapentin 300 mg at bedtime and taper up the dose over the next few days as long as no side effects  Referral to neurosurgery for evaluation to see if he is will benefit with any surgical option  He was recommended to avoid lifting or shifting anything heavy that may worsen the symptoms over time  We will continue taking his other medications as recommended  Follow-up in 3 months    The patient will return in follow-up as outlined above. If there are any new neurologic symptoms or worsening of current symptoms, the patient is instructed to contact us by telephone.    Lynann Bologna, MD  Neurology, Neurophysiology  [facility]

## 2023-06-04 ENCOUNTER — Other Ambulatory Visit: Payer: Self-pay | Admitting: Internal Medicine

## 2023-06-14 ENCOUNTER — Encounter: Payer: Self-pay | Admitting: Internal Medicine

## 2023-06-14 ENCOUNTER — Ambulatory Visit: Payer: BC Managed Care – PPO | Admitting: Internal Medicine

## 2023-06-14 VITALS — BP 124/79 | HR 59 | Temp 97.1°F | Ht 70.0 in | Wt 200.0 lb

## 2023-06-14 DIAGNOSIS — I444 Left anterior fascicular block: Secondary | ICD-10-CM

## 2023-06-14 DIAGNOSIS — I10 Essential (primary) hypertension: Secondary | ICD-10-CM

## 2023-06-14 DIAGNOSIS — R7303 Prediabetes: Secondary | ICD-10-CM

## 2023-06-14 DIAGNOSIS — M48062 Spinal stenosis, lumbar region with neurogenic claudication: Secondary | ICD-10-CM

## 2023-06-14 DIAGNOSIS — D696 Thrombocytopenia, unspecified: Secondary | ICD-10-CM

## 2023-06-14 DIAGNOSIS — F524 Premature ejaculation: Secondary | ICD-10-CM

## 2023-06-14 DIAGNOSIS — D709 Neutropenia, unspecified: Secondary | ICD-10-CM

## 2023-06-14 DIAGNOSIS — N1831 Chronic kidney disease, stage 3a: Secondary | ICD-10-CM

## 2023-06-14 HISTORY — DX: Spinal stenosis, lumbar region with neurogenic claudication: M48.062

## 2023-06-14 MED ORDER — REPATHA SURECLICK 140 MG/ML SC SOAJ
SUBCUTANEOUS | 0 refills | Status: DC
Start: 2023-06-14 — End: 2023-06-18

## 2023-06-14 NOTE — Progress Notes (Signed)
Date Time: 06/14/2023 9:42 AM  Patient Name: [name], [name]   DOB: 17-Jun-1956    Subjective:   [name] is a 67 y.o. male who presents for his 3 months routine medical follow-up visit    He is known to have a history of DVT of the right leg twice in 2018, hypertension, left anterior fascicular block, mixed hyperlipidemia, colonic polyps, bilateral nephrolithiasis, history of herpes simplex infection of the penis, history of stage IIIa chronic kidney disease, history of lumbar discectomy, history of vasectomy, major depressive disorder, history of prediabetes    (Historically)  In December 2020 he had a tooth problem and extraction and he was given antibiotics for about a week  Since then he has been having daily 4-5 times diarrhea without blood or mucus but he has some tenesmus he said  I gave him a course of Flagyl this did not help he went to gastroenterology and he had a colonoscopy they found 1 polyp which was not malignant but told him to come back in 10 years  History has intermittent diarrhea and they gave him loperamide    His blood work shows that his cholesterol is high and his blood pressure is still high as well    06/03/2020  He feels very good in himself he has no breathing troubles or chest pains no bowel or urinary problems his medications were reviewed with him he has tolerated the higher dose of the valsartan and its time to check his kidney function    09/09/2020  The gastroenterologist gave him loperamide for his intermittent loose stools and he gave him Metamucil which she has not taken  When he drives the toes in his right foot become a little bit numb and sometimes painful he has to shake his foot that everything is okay he has had DVT in that leg and he was worried  He has been taking his fenofibrate along with rosuvastatin and its time to recheck  He has not been taking Zoloft for his premature ejaculation on a routine basis only as needed which I told him of the wrong  way    01/31/2021  He feels very good in himself has no breathing troubles chest pain bowel or urinary problems  However he complains of tingling and numbness in his foot when he is driving less so otherwise    06/02/2021  He does not have a refill on his Valtrex and unfortunately he has an outbreak and he did not have the medicines  He is trying to exercise and lose weight he is not very successful unfortunately  No changes to the rest of his medications no breathing trouble chest pain no bowel or urinary problems  Medications reviewed and reconciled    09/06/2021  He did see the pain management doctors we did a CAT scan and 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 Loma Newton who referred him to Dr. Doreatha Martin who is a vascular surgeon and  He has no PND orthopnea chest pain or claudication    12/06/2021  He had to go to the emergency room the other day they did find another stone in the ureter he is known to have bilateral kidney stones anyways  He complains of lower buttock pain when he wakes up in the morning he wants something done about that he  had had lumbar spine surgery in the past discectomy in 2011  Medications were reviewed and reconciled    03/07/2022  He did see the nephrologist the urologist and the neurologist  No changes have been made to his medications which were reviewed and reconciled by myself  He continues to complain of 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 I told him really there is not much to do unless he really wants Botox injections    06/06/2022  Unfortunately in spite of Crestor and fenofibrate his cholesterol is still at 168 LDL  No breathing troubles or chest pains no bowel or urinary problems he has no other complaints    10/24/2022  He is here for preop clearance  He is going for an ESWL in a couple of weeks  Since I saw him last time  no new complaints he continues on taking all of his current medications without any problems    12/07/2022  He did have his ESWL and he has a follow-up appointment next week  He continues to complain of the neuropathic pain in his right foot when he is driving or sometimes even at nighttime  Medications all reviewed and reconciled with him    12/28/2022  For about 5 days he has been having a hacking cough runny nose sinus congestion body aches fevers and chills and he tested 3 times for COVID-19 and he was negative over-the-counter medications are not helping very much    03/15/2023  This is his annual physical examination  He continues to have some complaints regarding his buttocks his right trochanteric region and his right groin mostly when he sleeps at nighttime  He continues to follow-up with hematology and nephrology  No breathing troubles or chest pains no bowel or urinary problems  Medications reviewed and reconciled  Unfortunately the ESWL that he had for his renal stone failed they offered him surgery and he declined    06/14/2023  The nephrologist discharged him from his care only annual visits  The hematologist cut his Xarelto from 20 to 10 mg  The neurologist found severe lumbar spine stenosis causing his bilateral hip pain and now he is looking for a neurosurgeon    Past Medical History:     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    H/O lumbar discectomy 08/12/2018    2011     H/O: vasectomy 08/12/2018    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    Hyperplastic colonic polyp 08/12/2018    2017 return in 5 years    Hypertension     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    Mixed hyperlipidemia 08/12/2018     Nephrolithiasis 08/12/2018    x2 last 2018     Prediabetes 09/06/2021    Premature ejaculation 08/12/2018    Preop examination 09/29/2019    Routine history and physical examination of adult 03/15/2023    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/14/2023    Stage 3a chronic kidney disease 09/09/2020    Throat fullness 08/28/2019       Past Surgical History:     Past Surgical History:  Procedure Laterality Date    BUNIONECTOMY  02/09/2015    COLONOSCOPY, DIAGNOSTIC (SCREENING)  07/2007    left extracorporeal shockwave  11/09/2022    VASECTOMY  05/1992       Family History:     Family History   Problem Relation Age of Onset    Stroke Mother        Social History:     Social History     Socioeconomic History    Marital status: Significant Other     Spouse name: None    Number of children: None    Years of education: None    Highest education level: None   Occupational History    None   Tobacco Use    Smoking status: Never     Passive exposure: Never    Smokeless tobacco: Never   Vaping Use    Vaping status: Some Days   Substance and Sexual Activity    Alcohol use: Yes     Alcohol/week: 7.0 standard drinks of alcohol     Types: 7 Glasses of wine per week     Comment: 1 glass daily    Drug use: No    Sexual activity: None   Other Topics Concern    None   Social History Narrative    None     Social Determinants of Health     Financial Resource Strain: Medium Risk (03/11/2023)    Overall Financial Resource Strain (CARDIA)     Difficulty of Paying Living Expenses: Somewhat hard   Food Insecurity: No Food Insecurity (03/11/2023)    Hunger Vital Sign     Worried About Running Out of Food in the Last Year: Never true     Ran Out of Food in the Last Year: Never true   Transportation Needs: No Transportation Needs (03/11/2023)    PRAPARE - Therapist, art (Medical): No     Lack of Transportation (Non-Medical): No   Physical Activity: Insufficiently Active (03/11/2023)    Exercise  Vital Sign     Days of Exercise per Week: 3 days     Minutes of Exercise per Session: 40 min   Stress: Stress Concern Present (03/11/2023)    [employer] of Occupational Health - Occupational Stress Questionnaire     Feeling of Stress : To some extent   Social Connections: Moderately Isolated (03/11/2023)    Social Connection and Isolation Panel [NHANES]     Frequency of Communication with Friends and Family: More than three times a week     Frequency of Social Gatherings with Friends and Family: Once a week     Attends Religious Services: Never     Database administrator or Organizations: No     Attends Banker Meetings: Never     Marital Status: Living with partner   Intimate Partner Violence: Not At Risk (03/11/2023)    Humiliation, Afraid, Rape, and Kick questionnaire     Fear of Current or Ex-Partner: No     Emotionally Abused: No     Physically Abused: No     Sexually Abused: No   Housing Stability: Low Risk  (03/11/2023)    Housing Stability Vital Sign     Unable to Pay for Housing in the Last Year: No     Number of Places Lived in the Last Year: 1     Unstable Housing in the Last Year: No       Allergies:  No Known Allergies    Medications:     Current Outpatient Medications   Medication Sig Dispense Refill    Cholecalciferol (vitamin D3) 125 MCG (5000 UT) tablet Take 1 tablet (5,000 Units) by mouth daily      evolocumab (Repatha SureClick) 140 MG/ML subcutaneous auto-injector INJECT 1 ML INTO THE SKIN EVERY 14 DAYS 6 each 0    gabapentin (NEURONTIN) 300 MG capsule Take 1 capsule (300 mg) by mouth 3 (three) times daily 90 capsule 2    Multiple Vitamins-Minerals (MULTIVITAMIN WITH MINERALS) tablet Take 1 tablet by mouth daily      valsartan-hydroCHLOROthiazide (DIOVAN-HCT) 320-25 MG per tablet TAKE 1 TABLET BY MOUTH EVERY DAY 90 tablet 1    Xarelto 20 MG Tab TAKE 1 TABLET BY MOUTH EVERY DAY WITH DINNER 90 tablet 1     No current facility-administered medications for this visit.       Review of  Systems:   A comprehensive review of systems was:   General ROS:   Respiratory ROS: No cough shortness of breath or wheezing   cardiovascular ROS: no chest pain or dyspnea on exertion  Gastrointestinal ROS: occasional intermittent diarrhea  Genito-Urinary ROS: no dysuria, trouble voiding, or hematuria. Recurrent herpes simplex attacks  Musculoskeletal ROS: Bilateral lower buttock pain intermittently  neurological ROS: Intermittent numbness of the right toes while driving mostly but at nighttime also  Dermatologic ROS: Chronic bilateral hand intermittent hyperhidrosis    Physical Exam:     Vitals:    06/14/23 0831   BP: 124/79   Pulse: (!) 59   Temp: 97.1 F (36.2 C)   SpO2: 97%       BP Readings from Last 3 Encounters:   06/14/23 124/79   04/10/23 127/84   03/15/23 139/85     Wt Readings from Last 3 Encounters:   06/14/23 90.7 kg (200 lb)   05/29/23 91.2 kg (201 lb)   04/10/23 94.2 kg (207 lb 9.6 oz)   Body mass index is 28.7 kg/m.    General appearance - alert, well appearing, and in no distress  Chest - clear to auscultation, no wheezes, rales or rhonchi, symmetric air entry  Heart - normal rate, regular rhythm, normal S1, S2, no murmurs, rubs, clicks or gallops  Abdomen - soft, nontender, nondistended, no masses or organomegaly  Neurological - alert, oriented, normal speech, no focal findings or movement disorder noted  Extremities - peripheral pulses normal,  no clubbing or cyanosis   trace edema and vertical dermatitis around the right ankle     ECG shows normal sinus rhythm    Labs:     No results found for this or any previous visit (from the past 2016 hour(s)).        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    Hypertension    H/O lumbar discectomy    Bilateral nephrolithiasis    Mixed hyperlipidemia    Premature ejaculation    Herpes simplex infection of penis    LAFB (left  anterior fascicular block)    Throat fullness    COVID-19 virus infection    Adenomatous polyp of colon    Functional diarrhea    Major depressive disorder, recurrent episode, mild    Major depressive disorder, recurrent episode, moderate    Recurrent major depression  Stage 3a chronic kidney disease    Prediabetes    Acute bronchitis due to other specified organisms    Spinal stenosis of lumbar region with neurogenic claudication    Neutropenia, unspecified type    Thrombocytopenia       Orders Placed This Encounter   Procedures    Referral to Neurosurgery ([place])     Standing Status:   Future     Standing Expiration Date:   06/13/2024     Referral Priority:   Routine     Referral Type:   Consultation     Referral Reason:   Services Requested     Requested Specialty:   Neurosurgery     Number of Visits Requested:   1         1. History of recurrent right lower extremity DVT he is on Xarelto and he has intermittent trace edema and chronic varicose dermatitis  I made him a referral for consultation with Dr. 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 Hyperlipidemia Not well controlled he is supposed to be on Crestor 40 and TriCor however he does not have Crestor for quite a while now that he is back on his medications for 3 months with an LDL of 168 we will discontinue both and start him on Repatha 140 mg every 2 weeks if his insurance accepts  And his lipid profile is excellent    3.  Recurrent genital herpes about once a year and he gets Valtrex for 5 days new prescription was given    4.  Hypertension not well-controlled because of poor compliance I admonished his behavior  Valsartan was increased last time to 320/25 and the levels are much better now    5.  History of premature ejaculation and he uses Zoloft for that, And Viagra for erectile dysfunction  He was taking Zoloft as needed I told him he needs to take it every day    6.  History of colonic polyps last colonoscopy was in 2017 and repeat  colonoscopy in 01/2020 if on only 1 polyp nonmalignant told him to come back in 10 years    7.  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  History of kidney stones twice that he can remember drinking plenty of fluids.  Follow-up with urology    9. Erectile dysfunction and he Wanted an increase in the dose of Cialis therefore we gave him 20 mg    10.  Sensation of fullness in the throat status post steroid treatment by ENT however no improvement he was supposed to get a panendoscopy by Dr. Harrold Donath we did not discuss this today    11.  Diarrhea 4-5 times after antibiotic treatment for tooth extraction Flagyl that I gave him did not work he saw gastroenterology they did a colonoscopy that only found 1 polyp him back in 10 years  However he tells me he gets watery diarrhea still intermittently I therefore advised him to go back and talk to the gastroenterologist, which he did and they gave him loperamide and Metamucil strange enough I told him if he is not happy he should find another gastroenterologist    12.  CKD stage III 8 so far he is quite stable avoid nephrotoxins make sure his blood pressure is well controlled.  I would send him for consultation with Dr. Lauralee Evener from nephrology, he continues to follow-up with them and they continue to tell  him everything is stable.  Continue to follow-up with him    13.  Longstanding history of tingling and numbness in his right foot when he is driving less so otherwise I have referred him to neurology for nerve conduction studies but he never went I told him not to worry about it for now I gave him gabapentin to use on a as needed basis    14.  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 Loma Newton who is referring him to Dr. Doreatha Martin from vascular surgery.  He does not remember seeing either one of them we will try and get his report    15.  Bilateral lower buttock pain in a person who had  a history of lumbar discectomy in 2011 this happens in the mornings he wanted something done I would send him to neurology Dr. Mikey Kirschner refused MRI of the spine but they did MRI of the buttocks which showed gluteus minimus tendinosis and he was referred to physical therapy  MRI of the lumbar spine was done finally which showed severe spinal stenosis I will refer him to [facility] neurosurgery  In the meantime neurology increased his gabapentin which is helping    16.  Bilateral nephrolithiasis with intermittent ureteral stones the last 1 was last month November/2022   He did have ESWL recently and he will follow-up with urology next week    17.  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  Vitamin D deficiency recheck next visit And take it from there in the meantime he can stop taking his supplements      He received his COVID-19 immunization  He also received his flu vaccine and shingles vaccine  I instructed him to go and get his pneumonia vaccine    RTC in 3 months       This note was generated by the Epic EMR system/Speech recognition and may contain inherent errors or omissions not intended by the user. Grammatical errors, random word insertions, deletions and pronoun errors  are occasional consequences of this technology due to software limitations.   Not all errors are caught or corrected. If there are questions or concerns about the content of this note or information contained within the body of this dictation they should be addressed directly with the author for clarification.      Signed by: Nyra Jabs, MD

## 2023-06-18 ENCOUNTER — Other Ambulatory Visit: Payer: Self-pay | Admitting: Internal Medicine

## 2023-06-18 MED ORDER — REPATHA SURECLICK 140 MG/ML SC SOAJ
SUBCUTANEOUS | 0 refills | Status: DC
Start: 2023-06-18 — End: 2023-09-12

## 2023-06-23 ENCOUNTER — Other Ambulatory Visit: Payer: Self-pay | Admitting: Internal Medicine

## 2023-06-24 ENCOUNTER — Ambulatory Visit: Payer: BC Managed Care – PPO | Admitting: Neurological Surgery

## 2023-06-28 NOTE — Progress Notes (Signed)
[facility] Neurosurgery  New Patient Note    Referring MD: Nyra Jabs, MD  Primary Care MD: Nyra Jabs    MRN: [id number]    HPI     I saw [name] in my office today in  neurosurgical consultation for lumbar spinal stenosis. He was ***. He is a pleasant 67 y.o. 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    H/O lumbar discectomy 08/12/2018    2011     H/O: vasectomy 08/12/2018    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    Hyperplastic colonic polyp 08/12/2018    2017 return in 5 years    Hypertension     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    Mixed hyperlipidemia 08/12/2018    Nephrolithiasis 08/12/2018    x2 last 2018     Prediabetes 09/06/2021    Premature ejaculation 08/12/2018    Preop examination 09/29/2019    Routine history and physical examination of adult 03/15/2023    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/14/2023    Stage 3a chronic kidney disease 09/09/2020    Throat fullness 08/28/2019       Surgical History     Past Surgical History:   Procedure Laterality Date    BUNIONECTOMY  02/09/2015    COLONOSCOPY, DIAGNOSTIC (SCREENING)  07/2007    left extracorporeal shockwave  11/09/2022    VASECTOMY  05/1992       Family History     Family History   Problem Relation Age of Onset    Stroke Mother         Social History     Social History     Socioeconomic History    Marital status: Significant Other   Tobacco Use    Smoking status: Never     Passive exposure: Never    Smokeless tobacco: Never   Vaping Use    Vaping  status: Some Days   Substance and Sexual Activity    Alcohol use: Yes     Alcohol/week: 7.0 standard drinks of alcohol     Types: 7 Glasses of wine per week     Comment: 1 glass daily    Drug use: No     Social Determinants of Health     Financial Resource Strain: Medium Risk (03/11/2023)    Overall Financial Resource Strain (CARDIA)     Difficulty of Paying Living Expenses: Somewhat hard   Food Insecurity: No Food Insecurity (03/11/2023)    Hunger Vital Sign     Worried About Running Out of Food in the Last Year: Never true     Ran Out of Food in the Last Year: Never true   Transportation Needs: No Transportation Needs (03/11/2023)    PRAPARE - Therapist, art (Medical): No     Lack of Transportation (Non-Medical): No   Physical Activity: Insufficiently Active (03/11/2023)    Exercise Vital  Sign     Days of Exercise per Week: 3 days     Minutes of Exercise per Session: 40 min   Stress: Stress Concern Present (03/11/2023)    [employer] of Occupational Health - Occupational Stress Questionnaire     Feeling of Stress : To some extent   Social Connections: Moderately Isolated (03/11/2023)    Social Connection and Isolation Panel [NHANES]     Frequency of Communication with Friends and Family: More than three times a week     Frequency of Social Gatherings with Friends and Family: Once a week     Attends Religious Services: Never     Database administrator or Organizations: No     Attends Banker Meetings: Never     Marital Status: Living with partner   Intimate Partner Violence: Not At Risk (03/11/2023)    Humiliation, Afraid, Rape, and Kick questionnaire     Fear of Current or Ex-Partner: No     Emotionally Abused: No     Physically Abused: No     Sexually Abused: No   Housing Stability: Low Risk  (03/11/2023)    Housing Stability Vital Sign     Unable to Pay for Housing in the Last Year: No     Number of Places Lived in the Last Year: 1     Unstable Housing in the Last Year: No        Current Medications     Current Outpatient Medications   Medication Sig Dispense Refill    Cholecalciferol (vitamin D3) 125 MCG (5000 UT) tablet Take 1 tablet (5,000 Units) by mouth daily      evolocumab (Repatha SureClick) 140 MG/ML subcutaneous auto-injector INJECT 1 ML INTO THE SKIN EVERY 14 DAYS 6 each 0    gabapentin (NEURONTIN) 300 MG capsule Take 1 capsule (300 mg) by mouth 3 (three) times daily 90 capsule 2    Multiple Vitamins-Minerals (MULTIVITAMIN WITH MINERALS) tablet Take 1 tablet by mouth daily      valsartan-hydroCHLOROthiazide (DIOVAN-HCT) 320-25 MG per tablet TAKE 1 TABLET BY MOUTH EVERY DAY 90 tablet 1    Xarelto 20 MG Tab TAKE 1 TABLET BY MOUTH EVERY DAY WITH DINNER 90 tablet 1     No current facility-administered medications for this visit.       Allergies   No Known Allergies    Physical Examination   VITAL SIGNS:   vitals were not taken for this visit.          Neuro exam:   Awake, alert, orientedx3  Speech clear and fluent  Attention span normal  PERRL, EOMI  Facial sensation intact  Face symmetric  Hearing intact to conversation  Tongue midline  Shoulder shrug strong bilaterally  Motor:   Arms:      Deltoid  Bicep Tricep Grip IO   Right 5 5 5 5 5    Left  5 5 5 5 5        Legs:      HF KE KF DF PF EHL   Right 5 5 5 5 5 5    Left  5 5 5 5 5 5      No pronator drift  No dysmetria  Intact to light touch in all 4 extremities***  DTRs:  Patellar 2+    No Hoffmann's sign bilaterally  No Clonus bilaterally  Stable gait  {ABLE_UNABLE:23499} to tandem gait  {ABLE_UNABLE:23499} to toe and heel walk     Radiology Interpretation  I personally reviewed and interpreted the *** performed ***. It reveals     Impression     I discussed these findings with ***. We discussed     Plan     No orders of the defined types were placed in this encounter.    Follow-up      Loney Loh, NP  Mickle Asper, MD    I, [facility], performed portions of this visit by personally conducting most of the HPI and  orders of the visit. The patient was also seen by Dr. Mickle Asper, MD for the imaging interpretation, assessment and formulation of the medical decision making in the entirety. She also reviewed and updated the documented findings and plan of care.

## 2023-07-01 ENCOUNTER — Ambulatory Visit: Payer: BC Managed Care – PPO | Attending: Neurology | Admitting: Neurological Surgery

## 2023-07-01 ENCOUNTER — Encounter: Payer: Self-pay | Admitting: Neurological Surgery

## 2023-07-01 DIAGNOSIS — M545 Low back pain, unspecified: Secondary | ICD-10-CM

## 2023-07-01 DIAGNOSIS — G8929 Other chronic pain: Secondary | ICD-10-CM

## 2023-07-02 ENCOUNTER — Other Ambulatory Visit: Payer: Self-pay | Admitting: Internal Medicine

## 2023-07-02 DIAGNOSIS — F331 Major depressive disorder, recurrent, moderate: Secondary | ICD-10-CM

## 2023-07-03 ENCOUNTER — Encounter (INDEPENDENT_AMBULATORY_CARE_PROVIDER_SITE_OTHER): Payer: Self-pay

## 2023-07-05 ENCOUNTER — Encounter (HOSPITAL_BASED_OUTPATIENT_CLINIC_OR_DEPARTMENT_OTHER): Payer: Self-pay

## 2023-07-12 ENCOUNTER — Other Ambulatory Visit (INDEPENDENT_AMBULATORY_CARE_PROVIDER_SITE_OTHER): Payer: Self-pay | Admitting: Nurse Practitioner

## 2023-08-02 ENCOUNTER — Encounter (HOSPITAL_BASED_OUTPATIENT_CLINIC_OR_DEPARTMENT_OTHER): Payer: Self-pay | Admitting: Neurology

## 2023-08-02 ENCOUNTER — Ambulatory Visit (HOSPITAL_BASED_OUTPATIENT_CLINIC_OR_DEPARTMENT_OTHER): Payer: BC Managed Care – PPO | Admitting: Neurology

## 2023-08-02 ENCOUNTER — Other Ambulatory Visit (HOSPITAL_BASED_OUTPATIENT_CLINIC_OR_DEPARTMENT_OTHER): Payer: Self-pay | Admitting: Neurology

## 2023-08-02 VITALS — BP 119/75 | HR 64 | Temp 97.2°F | Resp 14 | Ht 71.0 in | Wt 201.0 lb

## 2023-08-02 DIAGNOSIS — G8929 Other chronic pain: Secondary | ICD-10-CM

## 2023-08-02 DIAGNOSIS — M545 Low back pain, unspecified: Secondary | ICD-10-CM

## 2023-08-02 MED ORDER — DULOXETINE HCL 30 MG PO CPEP
30.0000 mg | ORAL_CAPSULE | Freq: Two times a day (BID) | ORAL | 2 refills | Status: DC
Start: 2023-08-02 — End: 2023-08-14

## 2023-08-02 NOTE — Progress Notes (Signed)
Subjective:      Patient ID: [name] is a 67 y.o. male.    HPI    The patient is a pleasant 67 years old right-handed male who is here for follow-up.  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  He has been taking gabapentin 300 mg at bedtime, unable to take it during the daytime because of the increased sedation.  The pain is not that bad, he is not requiring any other pain medications.  He was recommended to follow-up with neurosurgery who have recommended nerve conduction studies, physical therapy as well as pain and spine clinic evaluation for adequate pain control    Prior note:  He was seen initially for low back pain for the last 3 months.  He was seen by Rosie Fate, our nurse practitioner and recommended MRI of the lumbar spine and physical therapy.  The pain has been persistent some days are worse than others including tingling numbness and paresthesias.  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    Prior note: The patient reports that the pain is mainly in the back, buttocks that seems to shoot down the back of the upper thigh.  It is unpredictable will, may occur without any trigger on waking up maybe 2-3 times a week and may last for few hours.  He also feels tingling and numbness in the toes especially while lying down or while driving the car, otherwise no other paresthesias.  His partner has brought some local ointment that seems to help with the pain, he has not tried taking any Motrin Advil or any other pain medication.  He denies having any weakness in the leg, he does have history of lumbar spine surgery many years ago, he is worried whether he has gotten another herniated disc that may be causing the symptoms.  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  He denies having any trauma, injury or any fall that may have brought on the symptoms.    No Known Allergies    The following portions of the patient's history were reviewed and updated as appropriate: allergies, current medications, past family history, past medical history, past social history, past surgical history, and problem list.    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    Mixed hyperlipidemia 08/12/2018    Nephrolithiasis 08/12/2018    x2 last 2018     Neuropathy 09/1 /22    Toes, buttock, and Lower Back    Pain Apr 23, 2020    Pins and needles sensation July 2022  Prediabetes 09/06/2021    Premature ejaculation 08/12/2018    Preop examination 09/29/2019    Routine history and physical examination of adult 03/15/2023    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/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    Stage 3a chronic kidney disease 09/09/2020    Throat fullness 08/28/2019     Current Outpatient Medications on File Prior to Visit   Medication Sig Dispense Refill    Cholecalciferol (vitamin D3) 125 MCG (5000 UT) tablet Take 1 tablet (5,000 Units) by mouth daily      evolocumab (Repatha SureClick) 140 MG/ML subcutaneous auto-injector INJECT 1 ML INTO THE SKIN EVERY 14 DAYS 6 each 0    gabapentin (NEURONTIN) 300 MG capsule Take 1 capsule (300 mg) by mouth 3 (three) times daily 90 capsule 2    loperamide (IMODIUM  A-D) 2 MG tablet Take 1 tablet (2 mg) by mouth as needed for Diarrhea      Multiple Vitamins-Minerals (MULTIVITAMIN WITH MINERALS) tablet Take 1 tablet by mouth daily      sertraline (ZOLOFT) 50 MG tablet Take 1 tablet (50 mg) by mouth daily as needed      sildenafil (VIAGRA) 100 MG tablet TAKE 1 TABLET BY MOUTH EVERY DAY AS NEEDED FOR 30 DAYS      valACYclovir HCL (VALTREX) 500 MG tablet Take 1 tablet (500 mg) by mouth daily as needed      valsartan-hydroCHLOROthiazide (DIOVAN-HCT) 320-25 MG per tablet TAKE 1 TABLET BY MOUTH EVERY DAY 90 tablet 1    Xarelto 10 MG Tab Take 1 tablet (10 mg) by mouth daily      Xarelto 20 MG Tab TAKE 1 TABLET BY MOUTH EVERY DAY WITH DINNER (Patient not taking: Reported on 08/02/2023) 90 tablet 1     No current facility-administered medications on file prior to visit.     Review of Systems   Constitutional:  Positive for activity change and fatigue.   HENT: Negative.     Eyes: Negative.    Respiratory: Negative.     Cardiovascular: Negative.    Gastrointestinal: Negative.    Endocrine: Negative.    Genitourinary: Negative.    Musculoskeletal:  Positive for gait problem.   Skin: Negative.    Allergic/Immunologic: Negative.    Neurological:  Positive for numbness. Negative for weakness.   Hematological: Negative.    Psychiatric/Behavioral:  Positive for decreased concentration and sleep disturbance. The patient is nervous/anxious.          Family History   Problem Relation Age of Onset    Stroke Mother     Hypertension Mother     Clotting disorder Mother     Heart disease Father      Objective:     Vitals:    08/02/23 1312   BP: 119/75   BP Site: Left arm   Patient Position: Sitting   Cuff Size: Large   Pulse: 64   Resp: 14   Temp: 97.2 F (36.2 C)   TempSrc: Temporal   SpO2: 97%   Weight: 91.2 kg (201 lb)   Height: 1.803 m (5\' 11" )     Neurologic Exam    Constitutional: Vital signs reviewed.   Head: Normocephalic, atraumatic, neck supple no JVD  Eyes: No conjunctival injection. No  discharge.   ENT: Mucous membranes moist   Neck: Normal range of motion. Non tender.   Respiratory/Chest: Clear to auscultation. No respiratory distress.   Cardiovascular: Regular rate and rhythm. No murmur.   Lower Extremity: No edema. No  cyanosis.   Skin: Warm and dry. No rash.   Lymphatic: No cervical lymphadenopathy.   Psychiatric: Normal affect.     Mental Status: The patient was awake, alert, and oriented. Conversation   was appropriate. Speech was fluent, and the patient followed commands   consistently.  Cranial Nerves: Pupils were equally round and reactive to light. Extraocular  movements were intact, without nystagmus. The patient's face was symmetric,   and facial sensation was intact. Tongue and palate were midline.   Motor Exam: The patient had full strength throughout. Tone and bulk appeared   normal. There were no abnormal movements or tremor noted.   Sensation: Decreased light touch, pinprick and vibration in the lower extremities distally  Coordination: Finger-to-nose and heel-to-shin testing was intact   without dysmetria. Romberg testing was negative.   Gait: Normal, with normal tandem walking.   Deep Tendon Reflexes: 2+ and symmetric throughout.   Toes were downgoing.    MRI Lumbar spine:  1.  Severe spinal canal stenosis at L3-L4 level due to combination of  severe facet arthropathy and ligamentum flavum thickening, prominent dorsal  epidural fat, and mild disc bulge.  2.  Transitional L5. There is an enlarged left L5 transverse process  which articulates with the sacrum. This pseudoarticulation joint exerts  mass effect on the extraforaminal left L5 nerve root.  3.  Additional degenerative changes as detailed by level above.    Assessment:     67 years old male presenting with bilateral hip pain with mild radiation to the back of the thighs, history of lumbar discectomy and sciatica  Worsening pain with severe spinal stenosis noted at recent MRI lumbar spine, unable to tolerate gabapentin,  will start low-dose of Cymbalta 30 mg twice daily, in 1 week he can stop gabapentin  Nerve conduction studies as recommended by neurosurgery  Continue to follow-up with neurosurgery for further treatment options  History of DVT on anticoagulation  Stress/anxiety and depression  Chronic kidney disease  Prediabetes      Plan:     Physical therapy  Cymbalta 30 mg twice daily  Nerve conduction studies  He was recommended to avoid lifting or shifting anything heavy that may worsen the symptoms over time  We will continue taking his other medications as recommended  Follow-up in 6 to 8 weeks for the nerve conduction studies    The patient will return in follow-up as outlined above. If there are any new neurologic symptoms or worsening of current symptoms, the patient is instructed to contact us by telephone.    Lynann Bologna, MD  Neurology, Neurophysiology   [facility]

## 2023-08-07 NOTE — Telephone Encounter (Signed)
Pt is calling in because the pharmacy is stating that the patient needs a PA on the medication duloxetine. Pt has been unable to fill the medication. Pt would also like a call back as well.

## 2023-08-12 ENCOUNTER — Ambulatory Visit: Payer: BC Managed Care – PPO | Admitting: Neurological Surgery

## 2023-08-21 ENCOUNTER — Other Ambulatory Visit (HOSPITAL_BASED_OUTPATIENT_CLINIC_OR_DEPARTMENT_OTHER): Payer: Self-pay | Admitting: Neurology

## 2023-09-09 ENCOUNTER — Other Ambulatory Visit: Payer: Self-pay | Admitting: Internal Medicine

## 2023-09-09 DIAGNOSIS — E782 Mixed hyperlipidemia: Secondary | ICD-10-CM

## 2023-09-09 DIAGNOSIS — I10 Essential (primary) hypertension: Secondary | ICD-10-CM

## 2023-09-11 LAB — BASIC METABOLIC PANEL
BUN / Creatinine Ratio: 14 (ref 10–24)
BUN: 20 mg/dL (ref 8–27)
CO2: 23 mmol/L (ref 20–29)
Calcium: 9.5 mg/dL (ref 8.6–10.2)
Chloride: 105 mmol/L (ref 96–106)
Creatinine: 1.41 mg/dL — ABNORMAL HIGH (ref 0.76–1.27)
Glucose: 98 mg/dL (ref 70–99)
Potassium: 4.2 mmol/L (ref 3.5–5.2)
Sodium: 143 mmol/L (ref 134–144)
eGFR: 55 mL/min/{1.73_m2} — ABNORMAL LOW (ref >59)

## 2023-09-11 LAB — LIPID PANEL
Cholesterol / HDL Ratio: 2.6 ratio (ref 0.0–5.0)
Cholesterol: 207 mg/dL — ABNORMAL HIGH (ref 100–199)
HDL: 80 mg/dL (ref >39)
LDL Chol Calculated (NIH): 110 mg/dL — ABNORMAL HIGH (ref 0–99)
Triglycerides: 97 mg/dL (ref 0–149)
VLDL Calculated: 17 mg/dL (ref 5–40)

## 2023-09-12 ENCOUNTER — Other Ambulatory Visit: Payer: Self-pay | Admitting: Internal Medicine

## 2023-09-13 ENCOUNTER — Encounter: Payer: Self-pay | Admitting: Nurse Practitioner

## 2023-09-13 ENCOUNTER — Ambulatory Visit: Payer: BC Managed Care – PPO | Admitting: Internal Medicine

## 2023-09-13 ENCOUNTER — Encounter: Payer: Self-pay | Admitting: Internal Medicine

## 2023-09-13 VITALS — BP 142/82 | HR 63 | Temp 97.2°F | Ht 71.0 in | Wt 205.0 lb

## 2023-09-13 DIAGNOSIS — M48062 Spinal stenosis, lumbar region with neurogenic claudication: Secondary | ICD-10-CM

## 2023-09-13 DIAGNOSIS — R7303 Prediabetes: Secondary | ICD-10-CM

## 2023-09-13 DIAGNOSIS — N1831 Chronic kidney disease, stage 3a: Secondary | ICD-10-CM

## 2023-09-13 DIAGNOSIS — F331 Major depressive disorder, recurrent, moderate: Secondary | ICD-10-CM

## 2023-09-13 DIAGNOSIS — Z23 Encounter for immunization: Secondary | ICD-10-CM

## 2023-09-13 NOTE — Addendum Note (Signed)
Addended by: Despina Hick. on: 09/13/2023 09:13 AM     Modules accepted: Orders

## 2023-09-13 NOTE — Progress Notes (Signed)
Date Time: 09/13/2023 9:09 AM  Patient Name: KADIR, NATIVIDAD   DOB: 1956/03/24    Subjective:   Elvyn Albach is a 67 y.o. male who presents for his 3 months routine medical follow-up visit    He is known to have a history of DVT of the right leg twice in 2018, hypertension, left anterior fascicular block, mixed hyperlipidemia, colonic polyps, bilateral nephrolithiasis, history of herpes simplex infection of the penis, history of stage IIIa chronic kidney disease, history of lumbar discectomy, history of vasectomy, major depressive disorder, history of prediabetes    (Historically)  In December 2020 he had a tooth problem and extraction and he was given antibiotics for about a week  Since then he has been having daily 4-5 times diarrhea without blood or mucus but he has some tenesmus he said  I gave him a course of Flagyl this did not help he went to gastroenterology and he had a colonoscopy they found 1 polyp which was not malignant but told him to come back in 10 years  History has intermittent diarrhea and they gave him loperamide    His blood work shows that his cholesterol is high and his blood pressure is still high as well    06/03/2020  He feels very good in himself he has no breathing troubles or chest pains no bowel or urinary problems his medications were reviewed with him he has tolerated the higher dose of the valsartan and its time to check his kidney function    09/09/2020  The gastroenterologist gave him loperamide for his intermittent loose stools and he gave him Metamucil which she has not taken  When he drives the toes in his right foot become a little bit numb and sometimes painful he has to shake his foot that everything is okay he has had DVT in that leg and he was worried  He has been taking his fenofibrate along with rosuvastatin and its time to recheck  He has not been taking Zoloft for his premature ejaculation on a routine basis only as needed which I told him of the wrong  way    01/31/2021  He feels very good in himself has no breathing troubles chest pain bowel or urinary problems  However he complains of tingling and numbness in his foot when he is driving less so otherwise    06/02/2021  He does not have a refill on his Valtrex and unfortunately he has an outbreak and he did not have the medicines  He is trying to exercise and lose weight he is not very successful unfortunately  No changes to the rest of his medications no breathing trouble chest pain no bowel or urinary problems  Medications reviewed and reconciled    09/06/2021  He did see the pain management doctors we did a CAT scan and 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 Loma Newton who referred him to Dr. Doreatha Martin who is a vascular surgeon and  He has no PND orthopnea chest pain or claudication    12/06/2021  He had to go to the emergency room the other day they did find another stone in the ureter he is known to have bilateral kidney stones anyways  He complains of lower buttock pain when he wakes up in the morning he wants something done about that he  had had lumbar spine surgery in the past discectomy in 2011  Medications were reviewed and reconciled    03/07/2022  He did see the nephrologist the urologist and the neurologist  No changes have been made to his medications which were reviewed and reconciled by myself  He continues to complain of 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 I told him really there is not much to do unless he really wants Botox injections    06/06/2022  Unfortunately in spite of Crestor and fenofibrate his cholesterol is still at 168 LDL  No breathing troubles or chest pains no bowel or urinary problems he has no other complaints    10/24/2022  He is here for preop clearance  He is going for an ESWL in a couple of weeks  Since I saw him last time  no new complaints he continues on taking all of his current medications without any problems    12/07/2022  He did have his ESWL and he has a follow-up appointment next week  He continues to complain of the neuropathic pain in his right foot when he is driving or sometimes even at nighttime  Medications all reviewed and reconciled with him    12/28/2022  For about 5 days he has been having a hacking cough runny nose sinus congestion body aches fevers and chills and he tested 3 times for COVID-19 and he was negative over-the-counter medications are not helping very much    03/15/2023  This is his annual physical examination  He continues to have some complaints regarding his buttocks his right trochanteric region and his right groin mostly when he sleeps at nighttime  He continues to follow-up with hematology and nephrology  No breathing troubles or chest pains no bowel or urinary problems  Medications reviewed and reconciled  Unfortunately the ESWL that he had for his renal stone failed they offered him surgery and he declined    06/14/2023  The nephrologist discharged him from his care only annual visits  The hematologist cut his Xarelto from 20 to 10 mg  The neurologist found severe lumbar spine stenosis causing his bilateral hip pain and now he is looking for a neurosurgeon    09/13/2023  His labs show stable renal function  He saw neurology and neurosurgery and they are planning on an EMG they gave him duloxetine and gabapentin but he declined to take them the pharmacist scared him about them and he is using a topical cannabinoid and he says it is working    Past Medical History:     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    H/O lumbar  discectomy 08/12/2018    2011     H/O: vasectomy 08/12/2018    Hearing loss     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    Hypercholesterolemia May 24, 2008    Hyperplastic colonic polyp 08/12/2018    2017 return in 5 years    Hypertension     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    Mixed hyperlipidemia 08/12/2018    Nephrolithiasis 08/12/2018    x2 last 2018     Neuropathy  09/1 /22    Toes, buttock, and Lower Back    Pain Apr 23, 2020    Pins and needles sensation July 2022    Prediabetes 09/06/2021    Premature ejaculation 08/12/2018    Preop examination 09/29/2019    Routine history and physical examination of adult 03/15/2023    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/14/2023    Stage 3a chronic kidney disease 09/09/2020    Throat fullness 08/28/2019       Past Surgical History:     Past Surgical History:   Procedure Laterality Date    BACK SURGERY  Dec 29, 2009    BUNIONECTOMY  02/09/2015    COLONOSCOPY, DIAGNOSTIC (SCREENING)  07/2007    left extracorporeal shockwave  11/09/2022    VASECTOMY  05/1992       Family History:     Family History   Problem Relation Name Age of Onset    Stroke Mother Donne Meckel     Hypertension Mother Jenaro Hettel     Clotting disorder Mother Joao Kingman     Heart disease Father John        Social History:     Social History     Socioeconomic History    Marital status: Significant Other     Spouse name: None    Number of children: None    Years of education: None    Highest education level: None   Occupational History    None   Tobacco Use    Smoking status: Never     Passive exposure: Never    Smokeless tobacco: Never    Tobacco comments:     Passing  faze   Vaping Use    Vaping status: Some Days   Substance and Sexual Activity    Alcohol use: Yes     Alcohol/week: 16.0 standard drinks of alcohol     Types: 10 Glasses of wine, 2 Cans of beer, 1 Shots of liquor, 3 Standard drinks or equivalent per week     Comment: 1 glass daily     Drug use: Not Currently     Types: Marijuana, Mescaline, PCP    Sexual activity: Yes     Partners: Female     Birth control/protection: Surgical   Other Topics Concern    None   Social History Narrative    None     Social Determinants of Health     Financial Resource Strain: Medium Risk (06/30/2023)    Overall Financial Resource Strain (CARDIA)     Difficulty of Paying Living Expenses: Somewhat hard   Food Insecurity: No Food Insecurity (06/30/2023)    Hunger Vital Sign     Worried About Running Out of Food in the Last Year: Never true     Ran Out of Food in the Last Year: Never true   Transportation Needs: No Transportation Needs (06/30/2023)    PRAPARE - Therapist, art (Medical): No     Lack of Transportation (Non-Medical): No   Physical Activity: Sufficiently Active (06/30/2023)    Exercise Vital Sign     Days of Exercise per Week: 4 days     Minutes of Exercise per Session: 60 min   Stress: Stress Concern Present (06/30/2023)    [employer] of Occupational Health - Occupational Stress Questionnaire     Feeling of Stress : To some extent   Social Connections: Socially Isolated (06/30/2023)    Social Connection  and Isolation Panel [NHANES]     Frequency of Communication with Friends and Family: More than three times a week     Frequency of Social Gatherings with Friends and Family: Once a week     Attends Religious Services: Never     Database administrator or Organizations: No     Attends Banker Meetings: Never     Marital Status: Divorced   Catering manager Violence: At Risk (06/30/2023)    Humiliation, Afraid, Rape, and Kick questionnaire     Fear of Current or Ex-Partner: No     Emotionally Abused: Yes     Physically Abused: No     Sexually Abused: No   Housing Stability: Low Risk  (06/30/2023)    Housing Stability Vital Sign     Unable to Pay for Housing in the Last Year: No     Number of Places Lived in the Last Year: 1     Unstable Housing in the Last Year: No       Allergies:    No Known Allergies    Medications:     Current Outpatient Medications   Medication Sig Dispense Refill    Cholecalciferol (vitamin D3) 125 MCG (5000 UT) tablet Take 1 tablet (5,000 Units) by mouth daily      DULoxetine (CYMBALTA) 30 MG capsule TAKE 1 CAPSULE (30 MG) BY MOUTH 2 (TWO) TIMES DAILY 180 capsule 1    evolocumab (Repatha SureClick) 140 MG/ML subcutaneous auto-injector INJECT 1 ML INTO THE SKIN EVERY 14 DAYS 6 each 0    gabapentin (NEURONTIN) 300 MG capsule Take 1 capsule (300 mg) by mouth 3 (three) times daily 90 capsule 2    Multiple Vitamins-Minerals (MULTIVITAMIN WITH MINERALS) tablet Take 1 tablet by mouth daily      sildenafil (VIAGRA) 100 MG tablet TAKE 1 TABLET BY MOUTH EVERY DAY AS NEEDED FOR 30 DAYS      valACYclovir HCL (VALTREX) 500 MG tablet Take 1 tablet (500 mg) by mouth daily as needed      valsartan-hydroCHLOROthiazide (DIOVAN-HCT) 320-25 MG per tablet TAKE 1 TABLET BY MOUTH EVERY DAY 90 tablet 1    Xarelto 10 MG Tab Take 1 tablet (10 mg) by mouth daily       No current facility-administered medications for this visit.       Review of Systems:   A comprehensive review of systems was:   General ROS:   Respiratory ROS: No cough shortness of breath or wheezing   cardiovascular ROS: no chest pain or dyspnea on exertion  Gastrointestinal ROS: occasional intermittent diarrhea  Genito-Urinary ROS: no dysuria, trouble voiding, or hematuria. Recurrent herpes simplex attacks  Musculoskeletal ROS: Bilateral lower buttock pain intermittently  neurological ROS: Intermittent numbness of the right toes while driving mostly but at nighttime also  Dermatologic ROS: Chronic bilateral hand intermittent hyperhidrosis    Physical Exam:     Vitals:    09/13/23 0848   BP: 142/82   Pulse: 63   Temp: 97.2 F (36.2 C)   SpO2: 97%       BP Readings from Last 3 Encounters:   09/13/23 142/82   08/02/23 119/75   07/01/23 135/86     Wt Readings from Last 3 Encounters:   09/13/23 93 kg (205 lb)   08/02/23 91.2 kg (201  lb)   07/01/23 89.2 kg (196 lb 11.2 oz)   Body mass index is 28.59 kg/m.    General appearance - alert, well appearing,  and in no distress  Chest - clear to auscultation, no wheezes, rales or rhonchi, symmetric air entry  Heart - normal rate, regular rhythm, normal S1, S2, no murmurs, rubs, clicks or gallops  Abdomen - soft, nontender, nondistended, no masses or organomegaly  Neurological - alert, oriented, normal speech, no focal findings or movement disorder noted  Extremities - peripheral pulses normal,  no clubbing or cyanosis   trace edema and vertical dermatitis around the right ankle     ECG shows normal sinus rhythm    Labs:     Recent Results (from the past 2016 hour(s))   Basic Metabolic Panel    Collection Time: 09/10/23  8:09 AM   Result Value Ref Range    Glucose 98 70 - 99 mg/dL    BUN 20 8 - 27 mg/dL    Creatinine 2.44 (H) 0.76 - 1.27 mg/dL    eGFR 55 (L) >01 UU/VOZ/3.66    BUN / Creatinine Ratio 14 10 - 24    Sodium 143 134 - 144 mmol/L    Potassium 4.2 3.5 - 5.2 mmol/L    Chloride 105 96 - 106 mmol/L    CO2 23 20 - 29 mmol/L    Calcium 9.5 8.6 - 10.2 mg/dL   Lipid Panel    Collection Time: 09/10/23  8:09 AM   Result Value Ref Range    Cholesterol 207 (H) 100 - 199 mg/dL    Triglycerides 97 0 - 149 mg/dL    HDL 80 >44 mg/dL    VLDL Calculated 17 5 - 40 mg/dL    LDL Chol Calculated (NIH) 034 (H) 0 - 99 mg/dL    Cholesterol / HDL Ratio 2.6 0.0 - 5.0 ratio           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    Hypertension    H/O lumbar discectomy    Bilateral nephrolithiasis    Mixed hyperlipidemia    Premature ejaculation    Herpes simplex infection of penis    LAFB (left anterior fascicular block)    Throat fullness    COVID-19 virus infection    Adenomatous polyp of colon    Functional diarrhea    Major depressive disorder, recurrent episode, mild    Major depressive  disorder, recurrent episode, moderate    Recurrent major depression    Stage 3a chronic kidney disease    Prediabetes    Acute bronchitis due to other specified organisms    Spinal stenosis of lumbar region with neurogenic claudication    Neutropenia, unspecified type    Thrombocytopenia       No orders of the defined types were placed in this encounter.        1. History of recurrent right lower extremity DVT he is on Xarelto and he has intermittent trace edema and chronic varicose dermatitis  I made him a referral for consultation with Dr. 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 Hyperlipidemia Not well controlled he is supposed to be on Crestor 40 and TriCor however he does not have Crestor for quite a while now that he is back on his medications for 3 months with an LDL of 168 we will discontinue both and start him on Repatha 140 mg every 2 weeks if his  insurance accepts  And his lipid profile is improved but not perfect    3.  Recurrent genital herpes about once a year and he gets Valtrex for 5 days new prescription was given    4.  Hypertension not well-controlled because of poor compliance I admonished his behavior  Valsartan was increased last time to 320/25 and the levels are much better now    5.  History of premature ejaculation and he uses Zoloft for that, And Viagra for erectile dysfunction  He was taking Zoloft as needed I told him he needs to take it every day    6.  History of colonic polyps last colonoscopy was in 2017 and repeat colonoscopy in 01/2020 if on only 1 polyp nonmalignant told him to come back in 10 years    7.  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  History of kidney stones twice that he can remember drinking plenty of fluids.  Follow-up with urology    9. Erectile dysfunction and he Wanted an increase in the dose of Cialis therefore we gave him 20 mg    10.  Sensation of fullness in the throat status post steroid treatment by ENT however no  improvement he was supposed to get a panendoscopy by Dr. Harrold Donath we did not discuss this today    11.  Diarrhea 4-5 times after antibiotic treatment for tooth extraction Flagyl that I gave him did not work he saw gastroenterology they did a colonoscopy that only found 1 polyp him back in 10 years  However he tells me he gets watery diarrhea still intermittently I therefore advised him to go back and talk to the gastroenterologist, which he did and they gave him loperamide and Metamucil strange enough I told him if he is not happy he should find another gastroenterologist    12.  CKD stage III 8 so far he is quite stable avoid nephrotoxins make sure his blood pressure is well controlled.  I would send him for consultation with Dr. Lauralee Evener from nephrology, he continues to follow-up with them and they continue to tell him everything is stable.  Continue to follow-up with him    13.  Longstanding history of tingling and numbness in his right foot when he is driving less so otherwise I have referred him to neurology for nerve conduction studies but he never went I told him not to worry about it for now I gave him gabapentin to use on a as needed basis    14.  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 Loma Newton who is referring him to Dr. Doreatha Martin from vascular surgery.  He does not remember seeing either one of them we will try and get his report    15.  Lumbar spine stenosis/bilateral lower buttock pain in a person who had a history of lumbar discectomy in 2011 this happens in the mornings he wanted something done I would send him to neurology Dr. Mikey Kirschner refused MRI of the spine but they did MRI of the buttocks which showed gluteus minimus tendinosis and he was referred to physical therapy  MRI of the lumbar spine was done finally which showed severe spinal stenosis I will refer him to [facility] neurosurgery  In the meantime neurology increased  his gabapentin which is helping  He stopped taking gabapentin and did not take the duloxetine that was  given to him.  He is seeing a neurosurgeon as well and they finding on EMG    16.  Bilateral nephrolithiasis with intermittent ureteral stones the last 1 was last month November/2022   He did have ESWL recently and he will follow-up with urology next week    17.  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  Vitamin D deficiency recheck next visit And take it from there in the meantime he can stop taking his supplements      He is encouraged to get the new COVID-19 vaccine  He also received his flu vaccine and shingles vaccine  I instructed him to go and get his pneumonia vaccine    RTC in 3 months       This note was generated by the Epic EMR system/Speech recognition and may contain inherent errors or omissions not intended by the user. Grammatical errors, random word insertions, deletions and pronoun errors  are occasional consequences of this technology due to software limitations.   Not all errors are caught or corrected. If there are questions or concerns about the content of this note or information contained within the body of this dictation they should be addressed directly with the author for clarification.      Signed by: Nyra Jabs, MD

## 2023-09-18 ENCOUNTER — Encounter: Payer: Self-pay | Admitting: Neurology

## 2023-09-18 ENCOUNTER — Ambulatory Visit: Payer: BC Managed Care – PPO | Attending: Neurology | Admitting: Neurology

## 2023-09-18 DIAGNOSIS — R200 Anesthesia of skin: Secondary | ICD-10-CM

## 2023-09-18 NOTE — Progress Notes (Signed)
[facility]  [address]  [address]  [place], [place] [address]  Ph: [phone] Fax: [phone]           Full Name: [name] Diabetes: N  Patient ID: [id number] Anti-Coag: Sorrelto  Gender: Male Pacemaker: N  Date of Birth: 15-Aug-1956 Technician: Shawna Orleans B.       Visit Date: 09/18/2023 8:19 AM  Age: 67 Years  Examining Physician: Maurine Minister, MD, JD, FAAN   Referring Physician: Mickle Asper, MD  Referral Reason: Buttocks pain and R toe numbness  Height: 5 feet 11 inch  Weight: 202 lbs  History: Low back pain and buttocks pain, numbness in toes of R foot.       Sensory NCS      Nerve / Sites Segments Rec. Site Onset Lat Peak Lat Ref. NP Amp Ref. Distance Velocity Temp.      ms ms ms V V cm m/s C   R Sural - Ankle (Calf)      Calf Calf - Ankle Ankle 2.40 3.15 <=4.20 6.2 >=5.0 14 58 31.3   L Sural - Ankle (Calf)      Calf Calf - Ankle Ankle 2.96 3.92 <=4.20 5.8 >=5.0 14 47 30.6   R Superficial peroneal - Ankle      Lat leg Lat leg - Ankle Ankle 2.73 3.65 <=4.40 5.1 >=5.0 14 51 31.3                 Motor NCS      Nerve / Sites Segments Muscle Latency Ref. Amplitude Ref. Distance Velocity Ref. Temp.      ms ms mV mV cm m/s m/s C   R Peroneal - EDB      Ankle Ankle - EDB EDB 3.54 <=6.45 7.4 >=2.0 9   31.9      Fib head Fib head - Ankle EDB 10.31  7.3  36 53 >=39 31.9      Pop fossa Pop fossa - Fib head EDB 12.65  7.3  10 43 >=39 31.9   R Tibial - AH      Ankle Ankle - AH AH 3.46 <=6.75 4.0 >=3.0 11   32      Pop fossa Pop fossa - Ankle AH 14.06  1.9  45 42 >=39 31.3   L Tibial - AH      Ankle Ankle - AH AH 4.38 <=6.75 3.6 >=3.0 11   30.2      Pop fossa Pop fossa - Ankle AH 13.92  3.5  45 47 >=39 30.3                 EMG Summary Table     Spontaneous MUAP Recruitment Activation   Muscle Nerve Roots IA Fib PSW Fasc H.F. Amp Dur. Morphology Pattern Effort   R. Tibialis anterior Deep peroneal (Fibular) L4-L5 N None None None None N N Normal N Normal   R. Gastrocnemius (Medial  head) Tibial S1-S2 N None None None None N N Normal N Normal   R. Vastus lateralis Femoral L2-L4 N None None None None N N Normal N Normal   L. Tibialis anterior Deep peroneal (Fibular) L4-L5 N None None None None N N Normal N Normal   L. Gastrocnemius (Medial head) Tibial S1-S2 1+ None 1+ None None N N Normal N Normal   L. Vastus lateralis Femoral L2-L4 N None None None None N N Normal N Normal   L. Biceps femoris (short head) Sciatic (peroneal  division) L5-S2 N None None None None N N Normal N Normal   L. Biceps femoris (long head) Sciatic (tibial division) L5-S2 N None None None None N N Normal N Normal               Summary:   Nerve conduction studies of both lower extremities were normal.    Concentric needle examination of selected bilateral lower extremities demonstrated increased insertional activity in the left medial gastrocnemius.    Impression:  This study is normal.  There is no electrophysiologic evidence for a peripheral neuropathy or a right or left lumbosacral radiculopathy.              ------------------------------  Maurine Minister, MD, JD, [place]

## 2023-12-08 ENCOUNTER — Other Ambulatory Visit: Payer: Self-pay | Admitting: Internal Medicine

## 2023-12-09 NOTE — Telephone Encounter (Signed)
He needs a 3 months follow-up

## 2023-12-09 NOTE — Telephone Encounter (Signed)
 Please Contact Patient for Follow up

## 2023-12-10 ENCOUNTER — Other Ambulatory Visit: Payer: Self-pay | Admitting: Internal Medicine

## 2023-12-10 DIAGNOSIS — I10 Essential (primary) hypertension: Secondary | ICD-10-CM

## 2023-12-10 DIAGNOSIS — E782 Mixed hyperlipidemia: Secondary | ICD-10-CM

## 2023-12-11 ENCOUNTER — Ambulatory Visit: Payer: BC Managed Care – PPO | Admitting: Internal Medicine

## 2023-12-21 LAB — COMPREHENSIVE METABOLIC PANEL
ALT: 27 [IU]/L (ref 0–44)
AST (SGOT): 25 [IU]/L (ref 0–40)
Albumin: 4.5 g/dL (ref 3.9–4.9)
Alkaline Phosphatase: 109 [IU]/L (ref 44–121)
BUN / Creatinine Ratio: 13 (ref 10–24)
BUN: 20 mg/dL (ref 8–27)
Bilirubin, Total: 0.6 mg/dL (ref 0.0–1.2)
CO2: 23 mmol/L (ref 20–29)
Calcium: 10.1 mg/dL (ref 8.6–10.2)
Chloride: 104 mmol/L (ref 96–106)
Creatinine: 1.59 mg/dL — ABNORMAL HIGH (ref 0.76–1.27)
Globulin, Total: 2.6 g/dL (ref 1.5–4.5)
Glucose: 102 mg/dL — ABNORMAL HIGH (ref 70–99)
Potassium: 4.3 mmol/L (ref 3.5–5.2)
Protein, Total: 7.1 g/dL (ref 6.0–8.5)
Sodium: 142 mmol/L (ref 134–144)
eGFR: 48 mL/min/{1.73_m2} — ABNORMAL LOW (ref >59)

## 2023-12-21 LAB — LIPID PANEL
Cholesterol / HDL Ratio: 2.4 {ratio} (ref 0.0–5.0)
Cholesterol: 204 mg/dL — ABNORMAL HIGH (ref 100–199)
HDL: 84 mg/dL (ref >39)
LDL Chol Calculated (NIH): 103 mg/dL — ABNORMAL HIGH (ref 0–99)
Triglycerides: 95 mg/dL (ref 0–149)
VLDL Calculated: 17 mg/dL (ref 5–40)

## 2023-12-25 ENCOUNTER — Other Ambulatory Visit: Payer: Self-pay | Admitting: Internal Medicine

## 2023-12-26 ENCOUNTER — Ambulatory Visit: Payer: BC Managed Care – PPO | Admitting: Internal Medicine

## 2023-12-26 ENCOUNTER — Encounter: Payer: Self-pay | Admitting: Internal Medicine

## 2023-12-26 VITALS — BP 115/73 | HR 61 | Temp 97.1°F | Ht 71.0 in | Wt 203.7 lb

## 2023-12-26 DIAGNOSIS — N1831 Chronic kidney disease, stage 3a: Secondary | ICD-10-CM

## 2023-12-26 DIAGNOSIS — M48062 Spinal stenosis, lumbar region with neurogenic claudication: Secondary | ICD-10-CM

## 2023-12-26 DIAGNOSIS — I10 Essential (primary) hypertension: Secondary | ICD-10-CM

## 2023-12-26 DIAGNOSIS — I82411 Acute embolism and thrombosis of right femoral vein: Secondary | ICD-10-CM

## 2023-12-26 MED ORDER — VALSARTAN-HYDROCHLOROTHIAZIDE 320-25 MG PO TABS
1.0000 | ORAL_TABLET | Freq: Every day | ORAL | 1 refills | Status: AC
Start: 2023-12-26 — End: ?

## 2023-12-26 MED ORDER — REPATHA SURECLICK 140 MG/ML SC SOAJ
SUBCUTANEOUS | 0 refills | Status: DC
Start: 2023-12-26 — End: 2024-03-07

## 2023-12-26 MED ORDER — VALACYCLOVIR HCL 500 MG PO TABS
500.0000 mg | ORAL_TABLET | Freq: Every day | ORAL | 0 refills | Status: DC | PRN
Start: 2023-12-26 — End: 2024-02-21

## 2023-12-26 NOTE — Progress Notes (Signed)
Date Time: 12/26/2023 9:22 AM  Patient Name: [name], [name]   DOB: 05/25/1956    Subjective:   [name] is a 68 y.o. male who presents for his 3 months routine medical follow-up visit    He is known to have a history of DVT of the right leg twice in 2018, hypertension, left anterior fascicular block, mixed hyperlipidemia, colonic polyps, bilateral nephrolithiasis, history of herpes simplex infection of the penis, history of stage IIIa chronic kidney disease, history of lumbar discectomy, history of vasectomy, major depressive disorder, history of prediabetes    (Historically)  In December 2020 he had a tooth problem and extraction and he was given antibiotics for about a week  Since then he has been having daily 4-5 times diarrhea without blood or mucus but he has some tenesmus he said  I gave him a course of Flagyl this did not help he went to gastroenterology and he had a colonoscopy they found 1 polyp which was not malignant but told him to come back in 10 years  History has intermittent diarrhea and they gave him loperamide    His blood work shows that his cholesterol is high and his blood pressure is still high as well    06/03/2020  He feels very good in himself he has no breathing troubles or chest pains no bowel or urinary problems his medications were reviewed with him he has tolerated the higher dose of the valsartan and its time to check his kidney function    09/09/2020  The gastroenterologist gave him loperamide for his intermittent loose stools and he gave him Metamucil which she has not taken  When he drives the toes in his right foot become a little bit numb and sometimes painful he has to shake his foot that everything is okay he has had DVT in that leg and he was worried  He has been taking his fenofibrate along with rosuvastatin and its time to recheck  He has not been taking Zoloft for his premature ejaculation on a routine basis only as needed which I told him of the wrong  way    01/31/2021  He feels very good in himself has no breathing troubles chest pain bowel or urinary problems  However he complains of tingling and numbness in his foot when he is driving less so otherwise    06/02/2021  He does not have a refill on his Valtrex and unfortunately he has an outbreak and he did not have the medicines  He is trying to exercise and lose weight he is not very successful unfortunately  No changes to the rest of his medications no breathing trouble chest pain no bowel or urinary problems  Medications reviewed and reconciled    09/06/2021  He did see the pain management doctors we did a CAT scan and 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 Loma Newton who referred him to Dr. Doreatha Martin who is a vascular surgeon and  He has no PND orthopnea chest pain or claudication    12/06/2021  He had to go to the emergency room the other day they did find another stone in the ureter he is known to have bilateral kidney stones anyways  He complains of lower buttock pain when he wakes up in the morning he wants something done about that he  had had lumbar spine surgery in the past discectomy in 2011  Medications were reviewed and reconciled    03/07/2022  He did see the nephrologist the urologist and the neurologist  No changes have been made to his medications which were reviewed and reconciled by myself  He continues to complain of 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 I told him really there is not much to do unless he really wants Botox injections    06/06/2022  Unfortunately in spite of Crestor and fenofibrate his cholesterol is still at 168 LDL  No breathing troubles or chest pains no bowel or urinary problems he has no other complaints    10/24/2022  He is here for preop clearance  He is going for an ESWL in a couple of weeks  Since I saw him last time  no new complaints he continues on taking all of his current medications without any problems    12/07/2022  He did have his ESWL and he has a follow-up appointment next week  He continues to complain of the neuropathic pain in his right foot when he is driving or sometimes even at nighttime  Medications all reviewed and reconciled with him    12/28/2022  For about 5 days he has been having a hacking cough runny nose sinus congestion body aches fevers and chills and he tested 3 times for COVID-19 and he was negative over-the-counter medications are not helping very much    03/15/2023  This is his annual physical examination  He continues to have some complaints regarding his buttocks his right trochanteric region and his right groin mostly when he sleeps at nighttime  He continues to follow-up with hematology and nephrology  No breathing troubles or chest pains no bowel or urinary problems  Medications reviewed and reconciled  Unfortunately the ESWL that he had for his renal stone failed they offered him surgery and he declined    06/14/2023  The nephrologist discharged him from his care only annual visits  The hematologist cut his Xarelto from 20 to 10 mg  The neurologist found severe lumbar spine stenosis causing his bilateral hip pain and now he is looking for a neurosurgeon    09/13/2023  His labs show stable renal function  He saw neurology and neurosurgery and they are planning on an EMG they gave him duloxetine and gabapentin but he declined to take them the pharmacist scared him about them and he is using a topical cannabinoid and he says it is working    12/26/2023  He did see the neurologist and the neurologist recently and before that the nephrologist everything is okay no changes to his current medications he needs some medications refilled  He has no new complaints as a matter fact    Past Medical History:     Past Medical History:   Diagnosis Date    Acute bronchitis due to other specified organisms  12/28/2022    Adenomatous polyp of colon 08/28/2019    COVID-19 virus infection 08/28/2019    07/06/2019    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    x2 last 2018    Functional diarrhea 06/03/2020    H/O lumbar discectomy 08/12/2018    2011     H/O: vasectomy 08/12/2018    Hearing loss     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    Hypercholesterolemia May 24, 2008    Hyperplastic colonic polyp 08/12/2018  2017 return in 5 years    Hypertension     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    Mixed hyperlipidemia 08/12/2018    Nephrolithiasis 08/12/2018    x2 last 2018     Neuropathy 09/1 /22    Toes, buttock, and Lower Back    Pain Apr 23, 2020    Pins and needles sensation July 2022    Prediabetes 09/06/2021    Premature ejaculation 08/12/2018    Preop examination 09/29/2019    Routine history and physical examination of adult 03/15/2023    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/14/2023    Stage 3a chronic kidney disease 09/09/2020    Throat fullness 08/28/2019       Past Surgical History:     Past Surgical History:   Procedure Laterality Date    BACK SURGERY  Dec 29, 2009    BUNIONECTOMY  02/09/2015    COLONOSCOPY, DIAGNOSTIC (SCREENING)  07/2007    left extracorporeal shockwave  11/09/2022    VASECTOMY  05/1992       Family History:     Family History   Problem Relation Name Age of Onset    Stroke Mother Trillion Renicker     Hypertension Mother Esiah Coffell     Clotting disorder Mother Jahsir Kobashigawa     Heart disease Father John        Social History:     Social History     Socioeconomic History    Marital status: Significant Other     Spouse name: None    Number of children: None    Years of education: None    Highest education level: None   Occupational History    None   Tobacco Use    Smoking status: Never     Passive exposure: Never    Smokeless tobacco: Never    Tobacco comments:     Passing  faze   Vaping Use    Vaping status: Some Days    Substance and Sexual Activity    Alcohol use: Yes     Alcohol/week: 16.0 standard drinks of alcohol     Types: 10 Glasses of wine, 2 Cans of beer, 1 Shots of liquor, 3 Standard drinks or equivalent per week     Comment: 1 glass daily    Drug use: Not Currently     Types: Marijuana, Mescaline, PCP    Sexual activity: Yes     Partners: Female     Birth control/protection: Surgical   Other Topics Concern    None   Social History Narrative    None     Social Drivers of Health     Financial Resource Strain: Medium Risk (06/30/2023)    Overall Financial Resource Strain (CARDIA)     Difficulty of Paying Living Expenses: Somewhat hard   Food Insecurity: No Food Insecurity (06/30/2023)    Hunger Vital Sign     Worried About Running Out of Food in the Last Year: Never true     Ran Out of Food in the Last Year: Never true   Transportation Needs: No Transportation Needs (06/30/2023)    PRAPARE - Therapist, art (Medical): No     Lack of Transportation (Non-Medical): No   Physical Activity: Sufficiently Active (06/30/2023)    Exercise Vital Sign     Days of Exercise per Week: 4 days     Minutes of Exercise per Session: 60 min  Stress: Stress Concern Present (06/30/2023)    [employer] of Occupational Health - Occupational Stress Questionnaire     Feeling of Stress : To some extent   Social Connections: Socially Isolated (06/30/2023)    Social Connection and Isolation Panel [NHANES]     Frequency of Communication with Friends and Family: More than three times a week     Frequency of Social Gatherings with Friends and Family: Once a week     Attends Religious Services: Never     Database administrator or Organizations: No     Attends Banker Meetings: Never     Marital Status: Divorced   Catering manager Violence: At Risk (06/30/2023)    Humiliation, Afraid, Rape, and Kick questionnaire     Fear of Current or Ex-Partner: No     Emotionally Abused: Yes     Physically Abused: No     Sexually Abused:  No   Housing Stability: Low Risk  (06/30/2023)    Housing Stability Vital Sign     Unable to Pay for Housing in the Last Year: No     Number of Places Lived in the Last Year: 1     Unstable Housing in the Last Year: No       Allergies:   No Known Allergies    Medications:     Current Outpatient Medications   Medication Sig Dispense Refill    Cholecalciferol (vitamin D3) 125 MCG (5000 UT) tablet Take 1 tablet (5,000 Units) by mouth daily      evolocumab (Repatha SureClick) 140 MG/ML subcutaneous auto-injector INJECT 1 ML INTO THE SKIN EVERY 14 DAYS 6 each 0    Multiple Vitamins-Minerals (MULTIVITAMIN WITH MINERALS) tablet Take 1 tablet by mouth daily      sildenafil (VIAGRA) 100 MG tablet TAKE 1 TABLET BY MOUTH EVERY DAY AS NEEDED FOR 30 DAYS      valACYclovir HCL (VALTREX) 500 MG tablet Take 1 tablet (500 mg) by mouth daily as needed (eruption) 60 tablet 0    valsartan-hydroCHLOROthiazide (DIOVAN-HCT) 320-25 MG per tablet Take 1 tablet by mouth daily 90 tablet 1    Xarelto 10 MG Tab Take 1 tablet (10 mg) by mouth daily       No current facility-administered medications for this visit.       Review of Systems:   A comprehensive review of systems was:   General ROS:   Respiratory ROS: No cough shortness of breath or wheezing   cardiovascular ROS: no chest pain or dyspnea on exertion  Gastrointestinal ROS: occasional intermittent diarrhea  Genito-Urinary ROS: no dysuria, trouble voiding, or hematuria. Recurrent herpes simplex attacks  Musculoskeletal ROS: Bilateral lower buttock pain intermittently  neurological ROS: Intermittent numbness of the right toes while driving mostly but at nighttime also  Dermatologic ROS: Chronic bilateral hand intermittent hyperhidrosis    Physical Exam:     Vitals:    12/26/23 0837   BP: 115/73   Pulse: 61   Temp: 97.1 F (36.2 C)   SpO2: 98%       BP Readings from Last 3 Encounters:   12/26/23 115/73   09/13/23 142/82   08/02/23 119/75     Wt Readings from Last 3 Encounters:   12/26/23  92.4 kg (203 lb 11.2 oz)   09/13/23 93 kg (205 lb)   08/02/23 91.2 kg (201 lb)   Body mass index is 28.41 kg/m.    General appearance - alert, well appearing, and in no  distress  Chest - clear to auscultation, no wheezes, rales or rhonchi, symmetric air entry  Heart - normal rate, regular rhythm, normal S1, S2, no murmurs, rubs, clicks or gallops  Abdomen - soft, nontender, nondistended, no masses or organomegaly  Neurological - alert, oriented, normal speech, no focal findings or movement disorder noted  Extremities - peripheral pulses normal,  no clubbing or cyanosis   trace edema and vertical dermatitis around the right ankle     ECG shows normal sinus rhythm    Labs:     Recent Results (from the past 12 weeks)   Lipid Panel    Collection Time: 12/20/23  8:24 AM   Result Value Ref Range    Cholesterol 204 (H) 100 - 199 mg/dL    Triglycerides 95 0 - 149 mg/dL    HDL 84 >16 mg/dL    VLDL Calculated 17 5 - 40 mg/dL    LDL Chol Calculated (NIH) 109 (H) 0 - 99 mg/dL    Cholesterol / HDL Ratio 2.4 0.0 - 5.0 ratio   Comprehensive Metabolic Panel    Collection Time: 12/20/23  8:24 AM   Result Value Ref Range    Glucose 102 (H) 70 - 99 mg/dL    BUN 20 8 - 27 mg/dL    Creatinine 6.04 (H) 0.76 - 1.27 mg/dL    eGFR 48 (L) >54 UJ/WJX/9.14    BUN / Creatinine Ratio 13 10 - 24    Sodium 142 134 - 144 mmol/L    Potassium 4.3 3.5 - 5.2 mmol/L    Chloride 104 96 - 106 mmol/L    CO2 23 20 - 29 mmol/L    Calcium 10.1 8.6 - 10.2 mg/dL    Protein, Total 7.1 6.0 - 8.5 g/dL    Albumin 4.5 3.9 - 4.9 g/dL    Globulin, Total 2.6 1.5 - 4.5 g/dL    Bilirubin, Total 0.6 0.0 - 1.2 mg/dL    Alkaline Phosphatase 109 44 - 121 IU/L    AST (SGOT) 25 0 - 40 IU/L    ALT 27 0 - 44 IU/L           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    Hypertension    H/O lumbar discectomy    Bilateral nephrolithiasis    Mixed  hyperlipidemia    Premature ejaculation    Herpes simplex infection of penis    LAFB (left anterior fascicular block)    Throat fullness    COVID-19 virus infection    Adenomatous polyp of colon    Functional diarrhea    Major depressive disorder, recurrent episode, mild    Major depressive disorder, recurrent episode, moderate    Recurrent major depression    Stage 3a chronic kidney disease    Prediabetes    Acute bronchitis due to other specified organisms    Spinal stenosis of lumbar region with neurogenic claudication    Neutropenia, unspecified type    Thrombocytopenia       No orders of the defined types were placed in this encounter.        1. History of recurrent right lower extremity DVT he is on Xarelto and he has intermittent trace edema and chronic varicose dermatitis  I made him a referral for consultation with Dr. 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 Hyperlipidemia Not well controlled he is supposed to be on Crestor 40 and TriCor however he does not have Crestor for quite a while now that he is back on his medications for 3 months with an LDL of 168 we will discontinue both and start him on Repatha 140 mg every 2 weeks if his insurance accepts  And his lipid profile is improved but not perfect    3.  Recurrent genital herpes about once a year and he gets Valtrex for 5 days new prescription was given    4.  Hypertension not well-controlled because of poor compliance I admonished his behavior  Valsartan was increased last time to 320/25 and the levels are much better now    5.  History of premature ejaculation and he uses Zoloft for that, And Viagra for erectile dysfunction  He was taking Zoloft as needed I told him he needs to take it every day    6.  History of colonic polyps last colonoscopy was in 2017 and repeat colonoscopy in 01/2020 if on only 1 polyp nonmalignant told him to come back in 10 years    7.  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  History of  kidney stones twice that he can remember drinking plenty of fluids.  Follow-up with urology    9. Erectile dysfunction and he Wanted an increase in the dose of Cialis therefore we gave him 20 mg    10.  Sensation of fullness in the throat status post steroid treatment by ENT however no improvement he was supposed to get a panendoscopy by Dr. Harrold Donath we did not discuss this today    11.  Diarrhea 4-5 times after antibiotic treatment for tooth extraction Flagyl that I gave him did not work he saw gastroenterology they did a colonoscopy that only found 1 polyp him back in 10 years  However he tells me he gets watery diarrhea still intermittently I therefore advised him to go back and talk to the gastroenterologist, which he did and they gave him loperamide and Metamucil strange enough I told him if he is not happy he should find another gastroenterologist    12.  CKD stage III 8 so far he is quite stable avoid nephrotoxins make sure his blood pressure is well controlled.  I would send him for consultation with Dr. Lauralee Evener from nephrology, he continues to follow-up with them and they continue to tell him everything is stable.  Continue to follow-up with him    13.  Longstanding history of tingling and numbness in his right foot when he is driving less so otherwise I have referred him to neurology for nerve conduction studies but he never went I told him not to worry about it for now I gave him gabapentin to use on a as needed basis    14.  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 Loma Newton who is referring him to Dr. Doreatha Martin from vascular surgery.  He does not remember seeing either one of them we will try and get his report    15.  Lumbar spine stenosis/bilateral lower buttock pain in a person who had a history of lumbar discectomy in 2011 this happens in the mornings he wanted something done I would send him to neurology Dr. Mikey Kirschner  refused MRI of  the spine but they did MRI of the buttocks which showed gluteus minimus tendinosis and he was referred to physical therapy  MRI of the lumbar spine was done finally which showed severe spinal stenosis I will refer him to [facility] neurosurgery  In the meantime neurology increased his gabapentin which is helping  He stopped taking gabapentin and did not take the duloxetine that was given to him.  He is seeing a neurosurgeon as well and they finding on EMG    16.  Bilateral nephrolithiasis with intermittent ureteral stones the last 1 was last month November/2022   He did have ESWL recently and he will follow-up with urology next week    17.  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  Vitamin D deficiency recheck next visit And take it from there in the meantime he can stop taking his supplements      He is encouraged to get the new COVID-19 vaccine  He also received his flu vaccine and shingles vaccine  I instructed him to go and get his pneumonia vaccine    RTC in 3 months       This note was generated by the Epic EMR system/Speech recognition and may contain inherent errors or omissions not intended by the user. Grammatical errors, random word insertions, deletions and pronoun errors  are occasional consequences of this technology due to software limitations.   Not all errors are caught or corrected. If there are questions or concerns about the content of this note or information contained within the body of this dictation they should be addressed directly with the author for clarification.      Signed by: Nyra Jabs, MD

## 2024-02-21 ENCOUNTER — Other Ambulatory Visit: Payer: Self-pay | Admitting: Internal Medicine

## 2024-03-05 ENCOUNTER — Other Ambulatory Visit: Payer: Self-pay | Admitting: Internal Medicine

## 2024-03-18 ENCOUNTER — Other Ambulatory Visit: Payer: Self-pay | Admitting: Internal Medicine

## 2024-03-18 DIAGNOSIS — E559 Vitamin D deficiency, unspecified: Secondary | ICD-10-CM

## 2024-03-18 DIAGNOSIS — I10 Essential (primary) hypertension: Secondary | ICD-10-CM

## 2024-03-18 DIAGNOSIS — E782 Mixed hyperlipidemia: Secondary | ICD-10-CM

## 2024-03-18 DIAGNOSIS — N1831 Chronic kidney disease, stage 3a: Secondary | ICD-10-CM

## 2024-03-19 LAB — CBC AND DIFFERENTIAL
Baso(Absolute): 0 10*3/uL (ref 0.0–0.2)
Basophils Automated: 0 %
Eosinophils Absolute: 0.1 10*3/uL (ref 0.0–0.4)
Eosinophils Automated: 2 %
Hematocrit: 52.8 % — ABNORMAL HIGH (ref 37.5–51.0)
Hemoglobin: 18 g/dL — ABNORMAL HIGH (ref 13.0–17.7)
Immature Granulocytes Absolute: 0 10*3/uL (ref 0.0–0.1)
Immature Granulocytes: 0 %
Lymphocytes Absolute: 1.8 10*3/uL (ref 0.7–3.1)
Lymphocytes Automated: 51 %
MCH: 31.3 pg (ref 26.6–33.0)
MCHC: 34.1 g/dL (ref 31.5–35.7)
MCV: 92 fL (ref 79–97)
Monocytes Absolute: 0.3 10*3/uL (ref 0.1–0.9)
Monocytes: 10 %
Neutrophils Absolute Count: 1.3 10*3/uL — ABNORMAL LOW (ref 1.4–7.0)
Neutrophils: 37 %
Platelets: 146 10*3/uL — ABNORMAL LOW (ref 150–450)
RBC: 5.75 x10E6/uL (ref 4.14–5.80)
RDW: 12.4 % (ref 11.6–15.4)
WBC: 3.5 10*3/uL (ref 3.4–10.8)

## 2024-03-19 LAB — COMPREHENSIVE METABOLIC PANEL
ALT: 24 IU/L (ref 0–44)
AST (SGOT): 22 IU/L (ref 0–40)
Albumin: 4.3 g/dL (ref 3.9–4.9)
Alkaline Phosphatase: 112 IU/L (ref 44–121)
BUN / Creatinine Ratio: 14 (ref 10–24)
BUN: 19 mg/dL (ref 8–27)
Bilirubin, Total: 0.5 mg/dL (ref 0.0–1.2)
CO2: 24 mmol/L (ref 20–29)
Calcium: 9.8 mg/dL (ref 8.6–10.2)
Chloride: 103 mmol/L (ref 96–106)
Creatinine: 1.34 mg/dL — ABNORMAL HIGH (ref 0.76–1.27)
Globulin, Total: 2.6 g/dL (ref 1.5–4.5)
Glucose: 98 mg/dL (ref 70–99)
Potassium: 4.2 mmol/L (ref 3.5–5.2)
Protein, Total: 6.9 g/dL (ref 6.0–8.5)
Sodium: 143 mmol/L (ref 134–144)
eGFR: 58 mL/min/{1.73_m2} — ABNORMAL LOW (ref >59)

## 2024-03-19 LAB — RENAL FUNCTION PANEL: Phosphorus: 2.9 mg/dL (ref 2.8–4.1)

## 2024-03-19 LAB — VITAMIN D, 25 OH, TOTAL: Vitamin D 25-Hydroxy: 63.6 ng/mL (ref 30.0–100.0)

## 2024-03-19 LAB — LIPID PANEL
Cholesterol / HDL Ratio: 2.4 ratio (ref 0.0–5.0)
Cholesterol: 203 mg/dL — ABNORMAL HIGH (ref 100–199)
HDL: 83 mg/dL (ref >39)
LDL Chol Calculated (NIH): 103 mg/dL — ABNORMAL HIGH (ref 0–99)
Triglycerides: 95 mg/dL (ref 0–149)
VLDL Calculated: 17 mg/dL (ref 5–40)

## 2024-03-25 ENCOUNTER — Ambulatory Visit: Payer: BC Managed Care – PPO | Admitting: Internal Medicine

## 2024-03-27 ENCOUNTER — Ambulatory Visit: Admitting: Internal Medicine

## 2024-03-27 ENCOUNTER — Encounter: Payer: Self-pay | Admitting: Internal Medicine

## 2024-03-27 VITALS — BP 122/81 | HR 59 | Temp 97.5°F | Ht 71.0 in | Wt 205.0 lb

## 2024-03-27 DIAGNOSIS — I82411 Acute embolism and thrombosis of right femoral vein: Secondary | ICD-10-CM

## 2024-03-27 DIAGNOSIS — R7303 Prediabetes: Secondary | ICD-10-CM

## 2024-03-27 DIAGNOSIS — M48062 Spinal stenosis, lumbar region with neurogenic claudication: Secondary | ICD-10-CM

## 2024-03-27 DIAGNOSIS — N1831 Chronic kidney disease, stage 3a: Secondary | ICD-10-CM

## 2024-03-27 DIAGNOSIS — I10 Essential (primary) hypertension: Secondary | ICD-10-CM

## 2024-03-27 DIAGNOSIS — Z9889 Other specified postprocedural states: Secondary | ICD-10-CM

## 2024-03-27 NOTE — Progress Notes (Signed)
 Date Time: 03/27/2024 10:27 AM  Patient Name: [name]   DOB: Aug 08, 1956    Subjective:   [name] is a 68 y.o. male who presents for his 3 months routine medical follow-up visit    He is known to have a history of DVT of the right leg twice in 2018, hypertension, left anterior fascicular block, mixed hyperlipidemia, colonic polyps, bilateral nephrolithiasis, history of herpes simplex infection of the penis, history of stage IIIa chronic kidney disease, history of lumbar discectomy, history of vasectomy, major depressive disorder, history of prediabetes    (Historically)  In December 2020 he had a tooth problem and extraction and he was given antibiotics for about a week  Since then he has been having daily 4-5 times diarrhea without blood or mucus but he has some tenesmus he said  I gave him a course of Flagyl  this did not help he went to gastroenterology and he had a colonoscopy they found 1 polyp which was not malignant but told him to come back in 10 years  History has intermittent diarrhea and they gave him loperamide     His blood work shows that his cholesterol is high and his blood pressure is still high as well    06/03/2020  He feels very good in himself he has no breathing troubles or chest pains no bowel or urinary problems his medications were reviewed with him he has tolerated the higher dose of the valsartan  and its time to check his kidney function    09/09/2020  The gastroenterologist gave him loperamide  for his intermittent loose stools and he gave him Metamucil which she has not taken  When he drives the toes in his right foot become a little bit numb and sometimes painful he has to shake his foot that everything is okay he has had DVT in that leg and he was worried  He has been taking his fenofibrate  along with rosuvastatin  and its time to recheck  He has not been taking Zoloft  for his premature ejaculation on a routine basis only as needed which I told him of the wrong  way    01/31/2021  He feels very good in himself has no breathing troubles chest pain bowel or urinary problems  However he complains of tingling and numbness in his foot when he is driving less so otherwise    06/02/2021  He does not have a refill on his Valtrex  and unfortunately he has an outbreak and he did not have the medicines  He is trying to exercise and lose weight he is not very successful unfortunately  No changes to the rest of his medications no breathing trouble chest pain no bowel or urinary problems  Medications reviewed and reconciled    09/06/2021  He did see the pain management doctors we did a CAT scan and 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 Jiles who referred him to Dr. Sheppard who is a vascular surgeon and  He has no PND orthopnea chest pain or claudication    12/06/2021  He had to go to the emergency room the other day they did find another stone in the ureter he is known to have bilateral kidney stones anyways  He complains of lower buttock pain when he wakes up in the morning he wants something done about that he  had had lumbar spine surgery in the past discectomy in 2011  Medications were reviewed and reconciled    03/07/2022  He did see the nephrologist the urologist and the neurologist  No changes have been made to his medications which were reviewed and reconciled by myself  He continues to complain of 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 I told him really there is not much to do unless he really wants Botox injections    06/06/2022  Unfortunately in spite of Crestor  and fenofibrate  his cholesterol is still at 168 LDL  No breathing troubles or chest pains no bowel or urinary problems he has no other complaints    10/24/2022  He is here for preop clearance  He is going for an ESWL in a couple of weeks  Since I saw him last time  no new complaints he continues on taking all of his current medications without any problems    12/07/2022  He did have his ESWL and he has a follow-up appointment next week  He continues to complain of the neuropathic pain in his right foot when he is driving or sometimes even at nighttime  Medications all reviewed and reconciled with him    12/28/2022  For about 5 days he has been having a hacking cough runny nose sinus congestion body aches fevers and chills and he tested 3 times for COVID-19 and he was negative over-the-counter medications are not helping very much    03/15/2023  This is his annual physical examination  He continues to have some complaints regarding his buttocks his right trochanteric region and his right groin mostly when he sleeps at nighttime  He continues to follow-up with hematology and nephrology  No breathing troubles or chest pains no bowel or urinary problems  Medications reviewed and reconciled  Unfortunately the ESWL that he had for his renal stone failed they offered him surgery and he declined    06/14/2023  The nephrologist discharged him from his care only annual visits  The hematologist cut his Xarelto  from 20 to 10 mg  The neurologist found severe lumbar spine stenosis causing his bilateral hip pain and now he is looking for a neurosurgeon    09/13/2023  His labs show stable renal function  He saw neurology and neurosurgery and they are planning on an EMG they gave him duloxetine  and gabapentin  but he declined to take them the pharmacist scared him about them and he is using a topical cannabinoid and he says it is working    12/26/2023  He did see the neurologist and the neurologist recently and before that the nephrologist everything is okay no changes to his current medications he needs some medications refilled  He has no new complaints as a matter fact    03/27/2024  He thinks that his hearing is getting a little bit worse and that he might go and check with the hearing aid  people down the corridor  No breathing troubles or chest pains no bowel or urinary problems  Medications reviewed and reconciled with him    Past Medical History:     Past Medical History:   Diagnosis Date    Acute bronchitis due to other specified organisms 12/28/2022    Adenomatous polyp of colon 08/28/2019    COVID-19 virus infection 08/28/2019    07/06/2019    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 (CMS/HCC) 08/12/2018    x2 last 2018    Functional  diarrhea 06/03/2020    H/O lumbar discectomy 08/12/2018    2011     H/O: vasectomy 08/12/2018    Hearing loss     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    Hypercholesterolemia May 24, 2008    Hyperplastic colonic polyp 08/12/2018    2017 return in 5 years    Hypertension     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    Mixed hyperlipidemia 08/12/2018    Nephrolithiasis 08/12/2018    x2 last 2018     Neuropathy 09/1 /22    Toes, buttock, and Lower Back    Pain Apr 23, 2020    Pins and needles sensation July 2022    Prediabetes 09/06/2021    Premature ejaculation 08/12/2018    Preop examination 09/29/2019    Routine history and physical examination of adult 03/15/2023    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/14/2023    Stage 3a chronic kidney disease (CMS/HCC) 09/09/2020    Throat fullness 08/28/2019       Past Surgical History:     Past Surgical History:   Procedure Laterality Date    BACK SURGERY  Dec 29, 2009    BUNIONECTOMY  02/09/2015    COLONOSCOPY, DIAGNOSTIC (SCREENING)  07/2007    left extracorporeal shockwave  11/09/2022    VASECTOMY  05/1992       Family History:     Family History   Problem Relation Name Age of Onset    Stroke Mother Christobal Morado     Hypertension Mother Matas Burrows     Clotting disorder Mother Kidus Delman     Heart disease Father John        Social History:     Social History     Socioeconomic History    Marital status: Significant Other   Tobacco Use    Smoking status: Never      Passive exposure: Never    Smokeless tobacco: Never    Tobacco comments:     Passing  faze   Vaping Use    Vaping status: Some Days   Substance and Sexual Activity    Alcohol use: Yes     Alcohol/week: 16.0 standard drinks of alcohol     Types: 10 Glasses of wine, 2 Cans of beer, 1 Shots of liquor, 3 Standard drinks or equivalent per week     Comment: 1 glass daily    Drug use: Not Currently     Types: Marijuana, Mescaline, PCP    Sexual activity: Yes     Partners: Female     Birth control/protection: Surgical     Social Drivers of Health     Financial Resource Strain: Medium Risk (06/30/2023)    Overall Financial Resource Strain (CARDIA)     Difficulty of Paying Living Expenses: Somewhat hard   Food Insecurity: No Food Insecurity (06/30/2023)    Hunger Vital Sign     Worried About Running Out of Food in the Last Year: Never true     Ran Out of Food in the Last Year: Never true   Transportation Needs: No Transportation Needs (06/30/2023)    PRAPARE - Therapist, Art (Medical): No     Lack of Transportation (Non-Medical): No   Physical Activity: Sufficiently Active (06/30/2023)    Exercise Vital Sign     Days of Exercise per Week: 4 days     Minutes of Exercise per Session: 60 min  Stress: Stress Concern Present (06/30/2023)    [employer] of Occupational Health - Occupational Stress Questionnaire     Feeling of Stress : To some extent   Social Connections: Socially Isolated (06/30/2023)    Social Connection and Isolation Panel [NHANES]     Frequency of Communication with Friends and Family: More than three times a week     Frequency of Social Gatherings with Friends and Family: Once a week     Attends Religious Services: Never     Database Administrator or Organizations: No     Attends Banker Meetings: Never     Marital Status: Divorced   Catering Manager Violence: At Risk (06/30/2023)    Humiliation, Afraid, Rape, and Kick questionnaire     Fear of Current or Ex-Partner: No      Emotionally Abused: Yes     Physically Abused: No     Sexually Abused: No   Housing Stability: Low Risk  (06/30/2023)    Housing Stability Vital Sign     Unable to Pay for Housing in the Last Year: No     Number of Places Lived in the Last Year: 1     Unstable Housing in the Last Year: No       Allergies:   No Known Allergies    Medications:     Current Outpatient Medications   Medication Sig Dispense Refill    Cholecalciferol (vitamin D3) 125 MCG (5000 UT) tablet Take 1 tablet (5,000 Units) by mouth daily      evolocumab  (Repatha  SureClick) 140 MG/ML subcutaneous auto-injector INJECT 1 ML INTO THE SKIN EVERY 14 DAYS 6 each 0    Multiple Vitamins-Minerals (MULTIVITAMIN WITH MINERALS) tablet Take 1 tablet by mouth daily      sildenafil  (VIAGRA ) 100 MG tablet TAKE 1 TABLET BY MOUTH EVERY DAY AS NEEDED FOR 30 DAYS      valACYclovir  HCL (VALTREX ) 500 MG tablet TAKE 1 TABLET (500 MG) BY MOUTH DAILY AS NEEDED (ERUPTION) 90 tablet 0    valsartan -hydroCHLOROthiazide  (DIOVAN -HCT) 320-25 MG per tablet Take 1 tablet by mouth daily 90 tablet 1    Xarelto  10 MG Tab Take 1 tablet (10 mg) by mouth daily       No current facility-administered medications for this visit.       Review of Systems:   A comprehensive review of systems was:   General ROS: Some hearing issues  Respiratory ROS: No cough shortness of breath or wheezing   cardiovascular ROS: no chest pain or dyspnea on exertion  Gastrointestinal ROS: occasional intermittent diarrhea  Genito-Urinary ROS: no dysuria, trouble voiding, or hematuria. Recurrent herpes simplex attacks  Musculoskeletal ROS: Bilateral lower buttock pain intermittently  neurological ROS: Intermittent numbness of the right toes while driving mostly but at nighttime also  Dermatologic ROS: Chronic bilateral hand intermittent hyperhidrosis    Physical Exam:     Vitals:    03/27/24 1009   BP: 122/81   Pulse: (!) 59   Temp: 97.5 F (36.4 C)   SpO2: 98%       BP Readings from Last 3 Encounters:   03/27/24  122/81   12/26/23 115/73   09/13/23 142/82     Wt Readings from Last 3 Encounters:   03/27/24 93 kg (205 lb)   12/26/23 92.4 kg (203 lb 11.2 oz)   09/13/23 93 kg (205 lb)   Body mass index is 28.59 kg/m.    General appearance - alert, well appearing,  and in no distress  Chest - clear to auscultation, no wheezes, rales or rhonchi, symmetric air entry  Heart - normal rate, regular rhythm, normal S1, S2, no murmurs, rubs, clicks or gallops  Abdomen - soft, nontender, nondistended, no masses or organomegaly  Neurological - alert, oriented, normal speech, no focal findings or movement disorder noted  Extremities - peripheral pulses normal,  no clubbing or cyanosis   trace edema and vertical dermatitis around the right ankle     ECG shows normal sinus rhythm    Labs:     Recent Results (from the past 12 weeks)   Comprehensive Metabolic Panel    Collection Time: 03/18/24  8:43 AM   Result Value Ref Range    Glucose 98 70 - 99 mg/dL    BUN 19 8 - 27 mg/dL    Creatinine 8.65 (H) 0.76 - 1.27 mg/dL    eGFR 58 (L) >40 fO/fpw/8.26    BUN / Creatinine Ratio 14 10 - 24    Sodium 143 134 - 144 mmol/L    Potassium 4.2 3.5 - 5.2 mmol/L    Chloride 103 96 - 106 mmol/L    CO2 24 20 - 29 mmol/L    Calcium  9.8 8.6 - 10.2 mg/dL    Protein, Total 6.9 6.0 - 8.5 g/dL    Albumin 4.3 3.9 - 4.9 g/dL    Globulin, Total 2.6 1.5 - 4.5 g/dL    Bilirubin, Total 0.5 0.0 - 1.2 mg/dL    Alkaline Phosphatase 112 44 - 121 IU/L    AST (SGOT) 22 0 - 40 IU/L    ALT 24 0 - 44 IU/L   Lipid Panel    Collection Time: 03/18/24  8:43 AM   Result Value Ref Range    Cholesterol 203 (H) 100 - 199 mg/dL    Triglycerides 95 0 - 149 mg/dL    HDL 83 >60 mg/dL    VLDL Calculated 17 5 - 40 mg/dL    LDL Chol Calculated (NIH) 896 (H) 0 - 99 mg/dL    Cholesterol / HDL Ratio 2.4 0.0 - 5.0 ratio   Vitamin D , 25 OH, Total    Collection Time: 03/18/24  8:43 AM   Result Value Ref Range    Vitamin D  25-Hydroxy 63.6 30.0 - 100.0 ng/mL   CBC with Differential (Order)    Collection  Time: 03/18/24  8:43 AM   Result Value Ref Range    WBC 3.5 3.4 - 10.8 x10E3/uL    RBC 5.75 4.14 - 5.80 x10E6/uL    Hemoglobin 18.0 (H) 13.0 - 17.7 g/dL    Hematocrit 47.1 (H) 37.5 - 51.0 %    MCV 92 79 - 97 fL    MCH 31.3 26.6 - 33.0 pg    MCHC 34.1 31.5 - 35.7 g/dL    RDW 87.5 88.3 - 84.5 %    Platelets 146 (L) 150 - 450 x10E3/uL    Neutrophils 37 Not Estab. %    Lymphocytes Automated 51 Not Estab. %    Monocytes 10 Not Estab. %    Eosinophils Automated 2 Not Estab. %    Basophils Automated 0 Not Estab. %    Neutrophils Absolute Count 1.3 (L) 1.4 - 7.0 x10E3/uL    Lymphocytes Absolute 1.8 0.7 - 3.1 x10E3/uL    Monocytes Absolute 0.3 0.1 - 0.9 x10E3/uL    Eosinophils Absolute 0.1 0.0 - 0.4 x10E3/uL    Baso(Absolute) 0.0 0.0 - 0.2 x10E3/uL  Immature Granulocytes 0 Not Estab. 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 (CMS/HCC)    Hypertension    H/O lumbar discectomy    Bilateral nephrolithiasis    Mixed hyperlipidemia    Premature ejaculation    Herpes simplex infection of penis    LAFB (left anterior fascicular block)    Throat fullness    COVID-19 virus infection    Adenomatous polyp of colon    Functional diarrhea    Major depressive disorder, recurrent episode, mild    Major depressive disorder, recurrent episode, moderate (CMS/HCC)    Recurrent major depression    Stage 3a chronic kidney disease (CMS/HCC)    Prediabetes    Acute bronchitis due to other specified organisms    Spinal stenosis of lumbar region with neurogenic claudication    Neutropenia, unspecified type    Thrombocytopenia       No orders of the defined types were placed in this encounter.        1. History of recurrent right lower  extremity DVT he is on Xarelto  and he has intermittent trace edema and chronic varicose dermatitis  I made him a referral for consultation with Dr. Shellia from hematology  I told him this is lifelong    2. Hyperlipidemia Not well controlled he is supposed to be on Crestor  40 and TriCor  however he does not have Crestor  for quite a while now that he is back on his medications for 3 months with an LDL of 168 we will discontinue both and start him on Repatha  140 mg every 2 weeks if his insurance accepts  And his lipid profile is improved but not perfect    3.  Recurrent genital herpes about once a year and he gets Valtrex  for 5 days new prescription was given    4.  Hypertension not well-controlled because of poor compliance I admonished his behavior  Valsartan  was increased last time to 320/25 and the levels are much better now    5.  History of premature ejaculation and he uses Zoloft  for that, And Viagra  for erectile dysfunction  He was taking Zoloft  as needed I told him he needs to take it every day    6.  History of colonic polyps last colonoscopy was in 2017 and repeat colonoscopy in 01/2020 if on only 1 polyp nonmalignant told him to come back in 10 years    7.  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  History of kidney stones twice that he can remember drinking plenty of fluids.  Follow-up with urology    9. Erectile dysfunction and he Wanted an increase in the dose of Cialis  therefore we gave him 20 mg    10.  Sensation of fullness in the throat status post steroid treatment by ENT however no improvement he was supposed to get a panendoscopy by Dr. Rankin we did not discuss this today    11.  Diarrhea 4-5 times after antibiotic treatment for tooth extraction Flagyl  that I  gave him did not work he saw gastroenterology they did a colonoscopy that only found 1 polyp him back in 10 years  However he tells me he gets watery diarrhea still intermittently I therefore advised him to go back and talk  to the gastroenterologist, which he did and they gave him loperamide  and Metamucil strange enough I told him if he is not happy he should find another gastroenterologist    12.  CKD stage III 8 so far he is quite stable avoid nephrotoxins make sure his blood pressure is well controlled.  I would send him for consultation with Dr. Dwayne from nephrology, he continues to follow-up with them and they continue to tell him everything is stable.  Continue to follow-up with him    13.  Longstanding history of tingling and numbness in his right foot when he is driving less so otherwise I have referred him to neurology for nerve conduction studies but he never went I told him not to worry about it for now I gave him gabapentin  to use on a as needed basis    14.  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 Jiles who is referring him to Dr. Sheppard from vascular surgery.  He does not remember seeing either one of them we will try and get his report    15.  Lumbar spine stenosis/bilateral lower buttock pain in a person who had a history of lumbar discectomy in 2011 this happens in the mornings he wanted something done I would send him to neurology Dr. Darrick Brew refused MRI of the spine but they did MRI of the buttocks which showed gluteus minimus tendinosis and he was referred to physical therapy  MRI of the lumbar spine was done finally which showed severe spinal stenosis I will refer him to [facility] neurosurgery  In the meantime neurology increased his gabapentin  which is helping  He stopped taking gabapentin  and did not take the duloxetine  that was given to him.  He is seeing a neurosurgeon as well and they finding on EMG    16.  Bilateral nephrolithiasis with intermittent ureteral stones the last 1 was last month November/2022   He did have ESWL recently and he will follow-up with urology next week    17.  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  Vitamin D  deficiency recheck next visit And take it from there in the meantime he can stop taking his supplements      He is encouraged to get the new COVID-19 vaccine  He also received his flu vaccine and shingles vaccine  I instructed him to go and get his pneumonia vaccine    RTC in 3 months       This note was generated by the [employer] EMR system/Speech recognition and may contain inherent errors or omissions not intended by the user. Grammatical errors, random word insertions, deletions and pronoun errors  are occasional consequences of this technology due to software limitations.   Not all errors are caught or corrected. If there are questions or concerns about the content of this note or information contained within the body of this dictation they should be  addressed directly with the author for clarification.      Signed by: Estelita DELENA Alias, MD

## 2024-03-30 ENCOUNTER — Other Ambulatory Visit: Payer: Self-pay | Admitting: Urology

## 2024-03-30 DIAGNOSIS — N200 Calculus of kidney: Secondary | ICD-10-CM

## 2024-04-01 ENCOUNTER — Ambulatory Visit: Attending: Urology

## 2024-04-01 DIAGNOSIS — N200 Calculus of kidney: Secondary | ICD-10-CM | POA: Insufficient documentation

## 2024-04-03 ENCOUNTER — Ambulatory Visit: Payer: BC Managed Care – PPO | Admitting: Internal Medicine

## 2024-04-23 ENCOUNTER — Telehealth: Admitting: Internal Medicine

## 2024-04-23 ENCOUNTER — Encounter: Payer: Self-pay | Admitting: Internal Medicine

## 2024-04-23 DIAGNOSIS — J208 Acute bronchitis due to other specified organisms: Secondary | ICD-10-CM

## 2024-04-23 MED ORDER — PROMETHAZINE-DM 6.25-15 MG/5ML PO SYRP
5.0000 mL | ORAL_SOLUTION | Freq: Four times a day (QID) | ORAL | 0 refills | Status: DC | PRN
Start: 2024-04-23 — End: 2024-10-02

## 2024-04-23 MED ORDER — AZITHROMYCIN 250 MG PO TABS
ORAL_TABLET | ORAL | 0 refills | Status: AC
Start: 2024-04-23 — End: 2024-05-06

## 2024-04-23 NOTE — Progress Notes (Signed)
 Telemedicine visit      Date Time: 04/23/2024 10:05 AM  Patient Name: [name]   DOB: 1956-07-29    Subjective:   [name] is a 68 y.o. male who presents for an urgent visit  This is a telemedicine visit  Verbal consent was obtained    He is known to have a history of DVT of the right leg twice in 2018, hypertension, left anterior fascicular block, mixed hyperlipidemia, colonic polyps, bilateral nephrolithiasis, history of herpes simplex infection of the penis, history of stage IIIa chronic kidney disease, history of lumbar discectomy, history of vasectomy, major depressive disorder, history of prediabetes    (Historically)  In December 2020 he had a tooth problem and extraction and he was given antibiotics for about a week  Since then he has been having daily 4-5 times diarrhea without blood or mucus but he has some tenesmus he said  I gave him a course of Flagyl  this did not help he went to gastroenterology and he had a colonoscopy they found 1 polyp which was not malignant but told him to come back in 10 years  History has intermittent diarrhea and they gave him loperamide     His blood work shows that his cholesterol is high and his blood pressure is still high as well    06/03/2020  He feels very good in himself he has no breathing troubles or chest pains no bowel or urinary problems his medications were reviewed with him he has tolerated the higher dose of the valsartan  and its time to check his kidney function    09/09/2020  The gastroenterologist gave him loperamide  for his intermittent loose stools and he gave him Metamucil which she has not taken  When he drives the toes in his right foot become a little bit numb and sometimes painful he has to shake his foot that everything is okay he has had DVT in that leg and he was worried  He has been taking his fenofibrate  along with rosuvastatin  and its time to recheck  He has not been taking Zoloft  for his premature ejaculation on a routine basis only as  needed which I told him of the wrong way    01/31/2021  He feels very good in himself has no breathing troubles chest pain bowel or urinary problems  However he complains of tingling and numbness in his foot when he is driving less so otherwise    06/02/2021  He does not have a refill on his Valtrex  and unfortunately he has an outbreak and he did not have the medicines  He is trying to exercise and lose weight he is not very successful unfortunately  No changes to the rest of his medications no breathing trouble chest pain no bowel or urinary problems  Medications reviewed and reconciled    09/06/2021  He did see the pain management doctors we did a CAT scan and 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 Katheryne Pane who referred him to Dr. Glee Lana who is a vascular surgeon and  He has no PND orthopnea chest pain or claudication    12/06/2021  He had to go to the emergency room the other day they did find another stone in the ureter he is known to have bilateral kidney stones anyways  He complains of lower buttock pain  when he wakes up in the morning he wants something done about that he had had lumbar spine surgery in the past discectomy in 2011  Medications were reviewed and reconciled    03/07/2022  He did see the nephrologist the urologist and the neurologist  No changes have been made to his medications which were reviewed and reconciled by myself  He continues to complain of 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 I told him really there is not much to do unless he really wants Botox injections    06/06/2022  Unfortunately in spite of Crestor  and fenofibrate  his cholesterol is still at 168 LDL  No breathing troubles or chest pains no bowel or urinary problems he has no other complaints    10/24/2022  He is here for preop clearance  He is going for an ESWL in a couple  of weeks  Since I saw him last time no new complaints he continues on taking all of his current medications without any problems    12/07/2022  He did have his ESWL and he has a follow-up appointment next week  He continues to complain of the neuropathic pain in his right foot when he is driving or sometimes even at nighttime  Medications all reviewed and reconciled with him    12/28/2022  For about 5 days he has been having a hacking cough runny nose sinus congestion body aches fevers and chills and he tested 3 times for COVID-19 and he was negative over-the-counter medications are not helping very much    03/15/2023  This is his annual physical examination  He continues to have some complaints regarding his buttocks his right trochanteric region and his right groin mostly when he sleeps at nighttime  He continues to follow-up with hematology and nephrology  No breathing troubles or chest pains no bowel or urinary problems  Medications reviewed and reconciled  Unfortunately the ESWL that he had for his renal stone failed they offered him surgery and he declined    06/14/2023  The nephrologist discharged him from his care only annual visits  The hematologist cut his Xarelto  from 20 to 10 mg  The neurologist found severe lumbar spine stenosis causing his bilateral hip pain and now he is looking for a neurosurgeon    09/13/2023  His labs show stable renal function  He saw neurology and neurosurgery and they are planning on an EMG they gave him duloxetine  and gabapentin  but he declined to take them the pharmacist scared him about them and he is using a topical cannabinoid and he says it is working    12/26/2023  He did see the neurologist and the neurologist recently and before that the nephrologist everything is okay no changes to his current medications he needs some medications refilled  He has no new complaints as a matter fact    03/27/2024  He thinks that his hearing is getting a little bit worse and that he  might go and check with the hearing aid people down the corridor  No breathing troubles or chest pains no bowel or urinary problems  Medications reviewed and reconciled with him    04/23/2024  This is yet another episode of cough sneezing sore throat aches and pains and expectoration and he tested negative for COVID-19    Past Medical History:     Past Medical History:   Diagnosis Date    Acute bronchitis due to other specified organisms 12/28/2022  Adenomatous polyp of colon 08/28/2019    COVID-19 virus infection 08/28/2019    07/06/2019    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 (CMS/HCC) 08/12/2018    x2 last 2018    Functional diarrhea 06/03/2020    H/O lumbar discectomy 08/12/2018    2011     H/O: vasectomy 08/12/2018    Hearing loss     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    Hypercholesterolemia May 24, 2008    Hyperplastic colonic polyp 08/12/2018    2017 return in 5 years    Hypertension     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    Mixed hyperlipidemia 08/12/2018    Nephrolithiasis 08/12/2018    x2 last 2018     Neuropathy 09/1 /22    Toes, buttock, and Lower Back    Pain Apr 23, 2020    Pins and needles sensation July 2022    Prediabetes 09/06/2021    Premature ejaculation 08/12/2018    Preop examination 09/29/2019    Routine history and physical examination of adult 03/15/2023    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/14/2023    Stage 3a chronic kidney disease (CMS/HCC) 09/09/2020    Throat fullness 08/28/2019       Past Surgical History:     Past Surgical History:   Procedure Laterality Date    BACK SURGERY  Dec 29, 2009    BUNIONECTOMY  02/09/2015    COLONOSCOPY, DIAGNOSTIC (SCREENING)  07/2007    left extracorporeal shockwave  11/09/2022    VASECTOMY  05/1992       Family History:     Family History   Problem Relation Name Age of Onset    Stroke Mother Marshawn Cline     Hypertension Mother Anthonymichael Mcgary     Clotting disorder Mother Dandy Brevig     Heart disease Father John        Social History:     Social History     Socioeconomic History    Marital status: Significant Other   Tobacco Use    Smoking status: Never     Passive exposure: Never    Smokeless tobacco: Never    Tobacco comments:     Passing  faze   Vaping Use    Vaping status: Some Days   Substance and Sexual Activity    Alcohol use: Yes     Alcohol/week: 16.0 standard drinks of alcohol     Types: 10 Glasses of wine, 2 Cans of beer, 1 Shots of liquor, 3 Standard drinks or equivalent per week     Comment: 1 glass daily    Drug use: Not Currently     Types: Marijuana, Mescaline, PCP    Sexual activity: Yes     Partners: Female     Birth control/protection: Surgical     Social Drivers of Health     Financial Resource Strain: Medium Risk (06/30/2023)    Overall Financial Resource Strain (CARDIA)     Difficulty of Paying Living Expenses: Somewhat hard   Food Insecurity: No Food Insecurity (06/30/2023)    Hunger Vital Sign     Worried About Running Out of Food in the Last Year: Never true     Ran Out of Food in the Last Year: Never true   Transportation Needs: No Transportation Needs (06/30/2023)    PRAPARE - Transportation     Lack of Transportation (Medical): No     Lack of  Transportation (Non-Medical): No   Physical Activity: Sufficiently Active (06/30/2023)    Exercise Vital Sign     Days of Exercise per Week: 4 days     Minutes of Exercise per Session: 60 min   Stress: Stress Concern Present (06/30/2023)    [employer] of Occupational Health - Occupational Stress Questionnaire     Feeling of Stress : To some extent   Social Connections: Socially Isolated (06/30/2023)    Social Connection and Isolation Panel [NHANES]     Frequency of Communication with Friends and Family: More than three times a week     Frequency of Social Gatherings with Friends and Family: Once a week     Attends Religious Services: Never     Database administrator or Organizations: No     Attends Banker  Meetings: Never     Marital Status: Divorced   Catering manager Violence: At Risk (06/30/2023)    Humiliation, Afraid, Rape, and Kick questionnaire     Fear of Current or Ex-Partner: No     Emotionally Abused: Yes     Physically Abused: No     Sexually Abused: No   Housing Stability: Low Risk  (06/30/2023)    Housing Stability Vital Sign     Unable to Pay for Housing in the Last Year: No     Number of Places Lived in the Last Year: 1     Unstable Housing in the Last Year: No       Allergies:   No Known Allergies    Medications:     Current Outpatient Medications   Medication Sig Dispense Refill    Cholecalciferol (vitamin D3) 125 MCG (5000 UT) tablet Take 1 tablet (5,000 Units) by mouth daily      evolocumab  (Repatha  SureClick) 140 MG/ML subcutaneous auto-injector INJECT 1 ML INTO THE SKIN EVERY 14 DAYS 6 each 0    Multiple Vitamins-Minerals (MULTIVITAMIN WITH MINERALS) tablet Take 1 tablet by mouth daily      sildenafil  (VIAGRA ) 100 MG tablet TAKE 1 TABLET BY MOUTH EVERY DAY AS NEEDED FOR 30 DAYS      valACYclovir  HCL (VALTREX ) 500 MG tablet TAKE 1 TABLET (500 MG) BY MOUTH DAILY AS NEEDED (ERUPTION) 90 tablet 0    valsartan -hydroCHLOROthiazide  (DIOVAN -HCT) 320-25 MG per tablet Take 1 tablet by mouth daily 90 tablet 1    Xarelto  10 MG Tab Take 1 tablet (10 mg) by mouth daily       No current facility-administered medications for this visit.       Review of Systems:   A comprehensive review of systems was:   General ROS: Some hearing issues  Respiratory ROS: Cough with expectoration and sneezing   cardiovascular ROS: no chest pain or dyspnea on exertion  Gastrointestinal ROS: occasional intermittent diarrhea  Genito-Urinary ROS: no dysuria, trouble voiding, or hematuria. Recurrent herpes simplex attacks  Musculoskeletal ROS: Bilateral lower buttock pain intermittently  neurological ROS: Intermittent numbness of the right toes while driving mostly but at nighttime also  Dermatologic ROS: Chronic bilateral hand  intermittent hyperhidrosis    Physical Exam:     There were no vitals filed for this visit.      BP Readings from Last 3 Encounters:   03/27/24 122/81   12/26/23 115/73   09/13/23 142/82     Wt Readings from Last 3 Encounters:   03/27/24 93 kg (205 lb)   12/26/23 92.4 kg (203 lb 11.2 oz)   09/13/23 93 kg (  205 lb)   There is no height or weight on file to calculate BMI.    Only visual examination was conducted during this telemedicine visit    Labs:     Recent Results (from the past 12 weeks)   Comprehensive Metabolic Panel    Collection Time: 03/18/24  8:43 AM   Result Value Ref Range    Glucose 98 70 - 99 mg/dL    BUN 19 8 - 27 mg/dL    Creatinine 2.13 (H) 0.76 - 1.27 mg/dL    eGFR 58 (L) >08 MV/HQI/6.96    BUN / Creatinine Ratio 14 10 - 24    Sodium 143 134 - 144 mmol/L    Potassium 4.2 3.5 - 5.2 mmol/L    Chloride 103 96 - 106 mmol/L    CO2 24 20 - 29 mmol/L    Calcium  9.8 8.6 - 10.2 mg/dL    Protein, Total 6.9 6.0 - 8.5 g/dL    Albumin 4.3 3.9 - 4.9 g/dL    Globulin, Total 2.6 1.5 - 4.5 g/dL    Bilirubin, Total 0.5 0.0 - 1.2 mg/dL    Alkaline Phosphatase 112 44 - 121 IU/L    AST (SGOT) 22 0 - 40 IU/L    ALT 24 0 - 44 IU/L   Lipid Panel    Collection Time: 03/18/24  8:43 AM   Result Value Ref Range    Cholesterol 203 (H) 100 - 199 mg/dL    Triglycerides 95 0 - 149 mg/dL    HDL 83 >29 mg/dL    VLDL Calculated 17 5 - 40 mg/dL    LDL Chol Calculated (NIH) 528 (H) 0 - 99 mg/dL    Cholesterol / HDL Ratio 2.4 0.0 - 5.0 ratio   Vitamin D , 25 OH, Total    Collection Time: 03/18/24  8:43 AM   Result Value Ref Range    Vitamin D  25-Hydroxy 63.6 30.0 - 100.0 ng/mL   CBC with Differential (Order)    Collection Time: 03/18/24  8:43 AM   Result Value Ref Range    WBC 3.5 3.4 - 10.8 x10E3/uL    RBC 5.75 4.14 - 5.80 x10E6/uL    Hemoglobin 18.0 (H) 13.0 - 17.7 g/dL    Hematocrit 41.3 (H) 37.5 - 51.0 %    MCV 92 79 - 97 fL    MCH 31.3 26.6 - 33.0 pg    MCHC 34.1 31.5 - 35.7 g/dL    RDW 24.4 01.0 - 27.2 %    Platelets 146 (L) 150 -  450 x10E3/uL    Neutrophils 37 Not Estab. %    Lymphocytes Automated 51 Not Estab. %    Monocytes 10 Not Estab. %    Eosinophils Automated 2 Not Estab. %    Basophils Automated 0 Not Estab. %    Neutrophils Absolute Count 1.3 (L) 1.4 - 7.0 x10E3/uL    Lymphocytes Absolute 1.8 0.7 - 3.1 x10E3/uL    Monocytes Absolute 0.3 0.1 - 0.9 x10E3/uL    Eosinophils Absolute 0.1 0.0 - 0.4 x10E3/uL    Baso(Absolute) 0.0 0.0 - 0.2 x10E3/uL    Immature Granulocytes 0 Not Estab. 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 (CMS/HCC)    Hypertension    H/O lumbar discectomy    Bilateral nephrolithiasis    Mixed hyperlipidemia    Premature ejaculation    Herpes simplex infection of penis    LAFB (left anterior fascicular block)    Throat fullness    COVID-19 virus infection    Adenomatous polyp of colon    Functional diarrhea    Major depressive disorder, recurrent episode, mild    Major depressive disorder, recurrent episode, moderate (CMS/HCC)    Recurrent major depression    Stage 3a chronic kidney disease (CMS/HCC)    Prediabetes    Acute bronchitis due to other specified organisms    Spinal stenosis of lumbar region with neurogenic claudication    Neutropenia, unspecified type    Thrombocytopenia       No orders of the defined types were placed in this encounter.        1. History of recurrent right lower extremity DVT he is on Xarelto  and he has intermittent trace edema and chronic varicose dermatitis  I made him a referral for consultation with Dr. Matilde Son from hematology  I told him this is lifelong    2. Hyperlipidemia Not well controlled he is supposed to be on Crestor  40 and TriCor  however he does not have Crestor  for  quite a while now that he is back on his medications for 3 months with an LDL of 168 we will discontinue both and start him on Repatha  140 mg every 2 weeks if his insurance accepts  And his lipid profile is improved but not perfect    3.  Recurrent genital herpes about once a year and he gets Valtrex  for 5 days new prescription was given    4.  Hypertension not well-controlled because of poor compliance I admonished his behavior  Valsartan  was increased last time to 320/25 and the levels are much better now    5.  History of premature ejaculation and he uses Zoloft  for that, And Viagra  for erectile dysfunction  He was taking Zoloft  as needed I told him he needs to take it every day    6.  History of colonic polyps last colonoscopy was in 2017 and repeat colonoscopy in 01/2020 if on only 1 polyp nonmalignant told him to come back in 10 years    7.  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  History of kidney stones twice that he can remember drinking plenty of fluids.  Follow-up with urology    9. Erectile dysfunction and he Wanted an increase in the dose of Cialis  therefore we gave him 20 mg    10.  Sensation of fullness in the throat status post steroid treatment by ENT however no improvement he was supposed to get a panendoscopy by Dr. Elana Grayer we did not discuss this today    11.  Diarrhea 4-5 times after antibiotic treatment for tooth extraction Flagyl  that I gave him did not work he saw gastroenterology they did a colonoscopy that only found 1 polyp him back in 10 years  However he tells me he gets watery diarrhea still intermittently I therefore advised him to go back and talk to the gastroenterologist, which he did and they gave him loperamide  and Metamucil strange enough I told him if he is  not happy he should find another gastroenterologist    12.  CKD stage III 8 so far he is quite stable avoid nephrotoxins make sure his blood pressure is well controlled.  I would send him for  consultation with Dr. Frosty Jews from nephrology, he continues to follow-up with them and they continue to tell him everything is stable.  Continue to follow-up with him    13.  Longstanding history of tingling and numbness in his right foot when he is driving less so otherwise I have referred him to neurology for nerve conduction studies but he never went I told him not to worry about it for now I gave him gabapentin  to use on a as needed basis    14.  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 Katheryne Pane who is referring him to Dr. Glee Lana from vascular surgery.  He does not remember seeing either one of them we will try and get his report    15.  Lumbar spine stenosis/bilateral lower buttock pain in a person who had a history of lumbar discectomy in 2011 this happens in the mornings he wanted something done I would send him to neurology Dr. Arabella Beach refused MRI of the spine but they did MRI of the buttocks which showed gluteus minimus tendinosis and he was referred to physical therapy  MRI of the lumbar spine was done finally which showed severe spinal stenosis I will refer him to [facility] neurosurgery  In the meantime neurology increased his gabapentin  which is helping  He stopped taking gabapentin  and did not take the duloxetine  that was given to him.  He is seeing a neurosurgeon as well and they finding on EMG    16.  Bilateral nephrolithiasis with intermittent ureteral stones the last 1 was last month November/2022   He did have ESWL recently and he will follow-up with urology next week    17.  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  Vitamin D  deficiency recheck next visit And take it from there in the meantime he  can stop taking his supplements    19.  Acute bronchitis again with negative COVID-19 testing Z-Pak along with Phenergan  DM were given    He is encouraged to get the new COVID-19 vaccine  He also received his flu vaccine and shingles vaccine  I instructed him to go and get his pneumonia vaccine    RTC in 3 months       This note was generated by the Epic EMR system/Speech recognition and may contain inherent errors or omissions not intended by the user. Grammatical errors, random word insertions, deletions and pronoun errors  are occasional consequences of this technology due to software limitations.   Not all errors are caught or corrected. If there are questions or concerns about the content of this note or information contained within the body of this dictation they should be addressed directly with the author for clarification.      Signed by: Elisa Guest, MD

## 2024-04-24 ENCOUNTER — Telehealth: Admitting: Internal Medicine

## 2024-06-20 ENCOUNTER — Other Ambulatory Visit: Payer: Self-pay | Admitting: Internal Medicine

## 2024-06-25 ENCOUNTER — Other Ambulatory Visit: Payer: Self-pay | Admitting: Internal Medicine

## 2024-07-03 ENCOUNTER — Other Ambulatory Visit: Payer: Self-pay | Admitting: Internal Medicine

## 2024-07-03 ENCOUNTER — Encounter: Payer: Self-pay | Admitting: Internal Medicine

## 2024-07-03 ENCOUNTER — Ambulatory Visit: Admitting: Internal Medicine

## 2024-07-03 VITALS — BP 103/70 | HR 68 | Temp 97.5°F | Ht 71.0 in | Wt 208.0 lb

## 2024-07-03 DIAGNOSIS — F330 Major depressive disorder, recurrent, mild: Secondary | ICD-10-CM

## 2024-07-03 DIAGNOSIS — N1831 Chronic kidney disease, stage 3a: Secondary | ICD-10-CM

## 2024-07-03 DIAGNOSIS — I10 Essential (primary) hypertension: Secondary | ICD-10-CM

## 2024-07-03 DIAGNOSIS — Z9889 Other specified postprocedural states: Secondary | ICD-10-CM

## 2024-07-03 DIAGNOSIS — M48062 Spinal stenosis, lumbar region with neurogenic claudication: Secondary | ICD-10-CM

## 2024-07-03 MED ORDER — BACLOFEN 10 MG PO TABS
10.0000 mg | ORAL_TABLET | Freq: Three times a day (TID) | ORAL | 0 refills | Status: DC
Start: 2024-07-03 — End: 2024-09-23

## 2024-07-03 MED ORDER — DICLOFENAC SODIUM 75 MG PO TBEC
75.0000 mg | DELAYED_RELEASE_TABLET | Freq: Two times a day (BID) | ORAL | 0 refills | Status: DC
Start: 2024-07-03 — End: 2024-09-23

## 2024-07-03 MED ORDER — GABAPENTIN 300 MG PO CAPS
300.0000 mg | ORAL_CAPSULE | Freq: Every evening | ORAL | 1 refills | Status: AC
Start: 2024-07-03 — End: ?

## 2024-07-03 NOTE — Progress Notes (Signed)
 Date Time: 07/03/2024 9:02 AM  Patient Name: [name], [name]   DOB: 1955-12-31    Subjective:   Austyn Seier is a 68 y.o. male who presents for routine medical follow-up visit    He is known to have a history of DVT of the right leg twice in 2018, hypertension, left anterior fascicular block, mixed hyperlipidemia, colonic polyps, bilateral nephrolithiasis, history of herpes simplex infection of the penis, history of stage IIIa chronic kidney disease, history of lumbar discectomy, history of vasectomy, major depressive disorder, history of prediabetes    (Historically)  In December 2020 he had a tooth problem and extraction and he was given antibiotics for about a week  Since then he has been having daily 4-5 times diarrhea without blood or mucus but he has some tenesmus he said  I gave him a course of Flagyl  this did not help he went to gastroenterology and he had a colonoscopy they found 1 polyp which was not malignant but told him to come back in 10 years  History has intermittent diarrhea and they gave him loperamide     His blood work shows that his cholesterol is high and his blood pressure is still high as well    06/03/2020  He feels very good in himself he has no breathing troubles or chest pains no bowel or urinary problems his medications were reviewed with him he has tolerated the higher dose of the valsartan  and its time to check his kidney function    09/09/2020  The gastroenterologist gave him loperamide  for his intermittent loose stools and he gave him Metamucil which she has not taken  When he drives the toes in his right foot become a little bit numb and sometimes painful he has to shake his foot that everything is okay he has had DVT in that leg and he was worried  He has been taking his fenofibrate  along with rosuvastatin  and its time to recheck  He has not been taking Zoloft  for his premature ejaculation on a routine basis only as needed which I told him of the wrong way    01/31/2021  He  feels very good in himself has no breathing troubles chest pain bowel or urinary problems  However he complains of tingling and numbness in his foot when he is driving less so otherwise    06/02/2021  He does not have a refill on his Valtrex  and unfortunately he has an outbreak and he did not have the medicines  He is trying to exercise and lose weight he is not very successful unfortunately  No changes to the rest of his medications no breathing trouble chest pain no bowel or urinary problems  Medications reviewed and reconciled    09/06/2021  He did see the pain management doctors we did a CAT scan and 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 Jiles who referred him to Dr. Sheppard who is a vascular surgeon and  He has no PND orthopnea chest pain or claudication    12/06/2021  He had to go to the emergency room the other day they did find another stone in the ureter he is known to have bilateral kidney stones anyways  He complains of lower buttock pain when he wakes up in the morning he wants something done about that he had had lumbar  spine surgery in the past discectomy in 2011  Medications were reviewed and reconciled    03/07/2022  He did see the nephrologist the urologist and the neurologist  No changes have been made to his medications which were reviewed and reconciled by myself  He continues to complain of 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 I told him really there is not much to do unless he really wants Botox injections    06/06/2022  Unfortunately in spite of Crestor  and fenofibrate  his cholesterol is still at 168 LDL  No breathing troubles or chest pains no bowel or urinary problems he has no other complaints    10/24/2022  He is here for preop clearance  He is going for an ESWL in a couple of weeks  Since I saw him last time no new complaints he  continues on taking all of his current medications without any problems    12/07/2022  He did have his ESWL and he has a follow-up appointment next week  He continues to complain of the neuropathic pain in his right foot when he is driving or sometimes even at nighttime  Medications all reviewed and reconciled with him    12/28/2022  For about 5 days he has been having a hacking cough runny nose sinus congestion body aches fevers and chills and he tested 3 times for COVID-19 and he was negative over-the-counter medications are not helping very much    03/15/2023  This is his annual physical examination  He continues to have some complaints regarding his buttocks his right trochanteric region and his right groin mostly when he sleeps at nighttime  He continues to follow-up with hematology and nephrology  No breathing troubles or chest pains no bowel or urinary problems  Medications reviewed and reconciled  Unfortunately the ESWL that he had for his renal stone failed they offered him surgery and he declined    06/14/2023  The nephrologist discharged him from his care only annual visits  The hematologist cut his Xarelto  from 20 to 10 mg  The neurologist found severe lumbar spine stenosis causing his bilateral hip pain and now he is looking for a neurosurgeon    09/13/2023  His labs show stable renal function  He saw neurology and neurosurgery and they are planning on an EMG they gave him duloxetine  and gabapentin  but he declined to take them the pharmacist scared him about them and he is using a topical cannabinoid and he says it is working    12/26/2023  He did see the neurologist and the neurologist recently and before that the nephrologist everything is okay no changes to his current medications he needs some medications refilled  He has no new complaints as a matter fact    03/27/2024  He thinks that his hearing is getting a little bit worse and that he might go and check [place] hearing aid [place]  No breathing troubles or chest pains no bowel or urinary problems  Medications reviewed and reconciled with him    04/23/2024  This is yet another episode of cough sneezing sore throat aches and pains and expectoration and he tested negative for COVID-19    07/03/2024  For the last few days he has been having left buttock pain going down on the backside of the left thigh and he does not have any more gabapentin  and other medications  These have all been reviewed and reconciled with him  His labs were also reviewed with him    Past Medical History:     Past Medical History:   Diagnosis Date    Acute bronchitis due to other specified organisms 12/28/2022    Adenomatous polyp of colon 08/28/2019    COVID-19 virus infection 08/28/2019    07/06/2019    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 (CMS/HCC) 08/12/2018    x2 last 2018    Functional diarrhea 06/03/2020    H/O lumbar discectomy 08/12/2018    2011     H/O: vasectomy 08/12/2018    Hearing loss     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    Hypercholesterolemia May 24, 2008    Hyperplastic colonic polyp 08/12/2018    2017 return in 5 years    Hypertension     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    Mixed hyperlipidemia 08/12/2018    Nephrolithiasis 08/12/2018    x2 last 2018     Neuropathy 09/1 /22    Toes, buttock, and Lower Back    Pain Apr 23, 2020    Pins and needles sensation July 2022    Prediabetes 09/06/2021    Premature ejaculation 08/12/2018    Preop examination 09/29/2019    Routine history and physical examination of adult 03/15/2023    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/14/2023    Stage 3a chronic kidney disease (CMS/HCC) 09/09/2020    Throat fullness 08/28/2019       Past Surgical History:     Past Surgical History:   Procedure Laterality Date    BACK SURGERY  Dec 29, 2009    BUNIONECTOMY  02/09/2015    COLONOSCOPY, DIAGNOSTIC (SCREENING)  07/2007    left extracorporeal shockwave   11/09/2022    VASECTOMY  05/1992       Family History:     Family History   Problem Relation Name Age of Onset    Stroke Mother Danial Hlavac     Hypertension Mother Manraj Yeo     Clotting disorder Mother Tristan Proto     Heart disease Father John        Social History:     Social History     Socioeconomic History    Marital status: Significant Other   Tobacco Use    Smoking status: Never     Passive exposure: Never    Smokeless tobacco: Never    Tobacco comments:     Passing  faze   Vaping Use    Vaping status: Some Days   Substance and Sexual Activity    Alcohol use: Yes     Alcohol/week: 16.0 standard drinks of alcohol     Types: 10 Glasses of wine, 2 Cans of beer, 1 Shots of liquor, 3 Standard drinks or equivalent per week     Comment: 1 glass daily    Drug use: Not Currently     Types: Marijuana, Mescaline, PCP    Sexual activity: Yes     Partners: Female     Birth control/protection: Surgical     Social Drivers of Health     Financial Resource Strain: Medium Risk (06/30/2023)    Overall Financial Resource Strain (CARDIA)     Difficulty of Paying Living Expenses: Somewhat hard   Food Insecurity: No Food Insecurity (06/30/2023)    Hunger Vital Sign     Worried About Running Out of Food in the Last Year: Never true     Ran  Out of Food in the Last Year: Never true   Transportation Needs: No Transportation Needs (06/30/2023)    PRAPARE - Therapist, art (Medical): No     Lack of Transportation (Non-Medical): No   Physical Activity: Sufficiently Active (06/30/2023)    Exercise Vital Sign     Days of Exercise per Week: 4 days     Minutes of Exercise per Session: 60 min   Stress: Stress Concern Present (06/30/2023)    [employer] of Occupational Health - Occupational Stress Questionnaire     Feeling of Stress : To some extent   Social Connections: Socially Isolated (06/30/2023)    Social Connection and Isolation Panel     Frequency of Communication with Friends and Family: More than  three times a week     Frequency of Social Gatherings with Friends and Family: Once a week     Attends Religious Services: Never     Database administrator or Organizations: No     Attends Banker Meetings: Never     Marital Status: Divorced   Catering manager Violence: At Risk (06/30/2023)    Humiliation, Afraid, Rape, and Kick questionnaire     Fear of Current or Ex-Partner: No     Emotionally Abused: Yes     Physically Abused: No     Sexually Abused: No   Housing Stability: Low Risk  (06/30/2023)    Housing Stability Vital Sign     Unable to Pay for Housing in the Last Year: No     Number of Places Lived in the Last Year: 1     Unstable Housing in the Last Year: No       Allergies:   No Known Allergies    Medications:     Current Outpatient Medications   Medication Sig Dispense Refill    Cholecalciferol (vitamin D3) 125 MCG (5000 UT) tablet Take 1 tablet (5,000 Units) by mouth daily      evolocumab  (Repatha  SureClick) 140 MG/ML subcutaneous auto-injector INJECT 1 ML INTO THE SKIN EVERY 14 DAYS 6 each 0    Multiple Vitamins-Minerals (MULTIVITAMIN WITH MINERALS) tablet Take 1 tablet by mouth daily      promethazine -dextromethorphan (PROMETHAZINE -DM) 6.25-15 MG/5ML syrup Take 5 mLs by mouth 4 (four) times daily as needed for Cough 180 mL 0    sildenafil  (VIAGRA ) 100 MG tablet TAKE 1 TABLET BY MOUTH EVERY DAY AS NEEDED FOR 30 DAYS      valACYclovir  HCL (VALTREX ) 500 MG tablet TAKE 1 TABLET (500 MG) BY MOUTH DAILY AS NEEDED (ERUPTION) 90 tablet 0    valsartan -hydroCHLOROthiazide  (DIOVAN -HCT) 320-25 MG per tablet TAKE 1 TABLET BY MOUTH EVERY DAY 90 tablet 1    Xarelto  10 MG Tab Take 1 tablet (10 mg) by mouth daily      baclofen  (LIORESAL ) 10 MG tablet Take 1 tablet (10 mg) by mouth 3 (three) times daily 30 tablet 0    diclofenac  (VOLTAREN ) 75 MG EC tablet Take 1 tablet (75 mg) by mouth 2 (two) times daily 60 tablet 0    gabapentin  (NEURONTIN ) 300 MG capsule Take 1 capsule (300 mg) by mouth once at bedtime 30  capsule 1     No current facility-administered medications for this visit.       Review of Systems:   A comprehensive review of systems was:   General ROS: Some hearing issues  Respiratory ROS: Cough with expectoration and sneezing   cardiovascular ROS: no  chest pain or dyspnea on exertion  Gastrointestinal ROS: occasional intermittent diarrhea  Genito-Urinary ROS: no dysuria, trouble voiding, or hematuria. Recurrent herpes simplex attacks  Musculoskeletal ROS: Bilateral lower buttock pain intermittently  neurological ROS: Intermittent numbness of the right toes while driving mostly but at nighttime also  Dermatologic ROS: Chronic bilateral hand intermittent hyperhidrosis    Physical Exam:     Vitals:    07/03/24 0823   BP: 103/70   Pulse: 68   Temp: 97.5 F (36.4 C)   SpO2: 98%         BP Readings from Last 3 Encounters:   07/03/24 103/70   03/27/24 122/81   12/26/23 115/73     Wt Readings from Last 3 Encounters:   07/03/24 94.3 kg (208 lb)   03/27/24 93 kg (205 lb)   12/26/23 92.4 kg (203 lb 11.2 oz)   Body mass index is 29.01 kg/m.    General appearance - alert, well appearing, and in no distress  Chest - clear to auscultation, no wheezes, rales or rhonchi, symmetric air entry  Heart - normal rate, regular rhythm, normal S1, S2, no murmurs, rubs, clicks or gallops  Abdomen - soft, nontender, nondistended, no masses or organomegaly  Neurological - alert, oriented, normal speech, no focal findings or movement disorder noted  Extremities - peripheral pulses normal,  no clubbing or cyanosis   trace edema and vertical dermatitis around the right ankle     ECG shows normal sinus rhythm    Labs:     No results found for this or any previous visit (from the past 12 weeks).          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 (CMS/HCC)    Hypertension    H/O lumbar discectomy    Bilateral  nephrolithiasis    Mixed hyperlipidemia    Premature ejaculation    Herpes simplex infection of penis    LAFB (left anterior fascicular block)    Throat fullness    COVID-19 virus infection    Adenomatous polyp of colon    Functional diarrhea    Major depressive disorder, recurrent episode, mild    Major depressive disorder, recurrent episode, moderate (CMS/HCC)    Recurrent major depression    Stage 3a chronic kidney disease (CMS/HCC)    Prediabetes    Acute bronchitis due to other specified organisms    Spinal stenosis of lumbar region with neurogenic claudication    Neutropenia, unspecified type    Thrombocytopenia       No orders of the defined types were placed in this encounter.        1. History of recurrent right lower extremity DVT he is on Xarelto  and he has intermittent trace edema and chronic varicose dermatitis  I made him a referral for consultation with Dr. Shellia from hematology  I told him this is lifelong    2. Hyperlipidemia Not well controlled he is supposed to be on Crestor  40 and TriCor  however he does not have Crestor  for quite a while now that he is back on his medications for 3 months with an LDL of 168 we will discontinue both and start him on Repatha  140 mg every 2 weeks if his insurance accepts  And his lipid profile is improved but not perfect    3.  Recurrent genital herpes about once a year and he gets Valtrex  for 5 days new prescription was given    4.  Hypertension not well-controlled because of poor compliance I admonished his behavior  Valsartan  was increased last time to 320/25 and the levels are much better now    5.  History of premature ejaculation and he uses Zoloft  for that, And Viagra  for erectile dysfunction  He was taking Zoloft  as needed I told him he needs to take it every day    6.  History of colonic polyps last colonoscopy was in 2017 and repeat colonoscopy in 01/2020 if on only 1 polyp nonmalignant told him to come back in 10 years    7.  Left anterior fascicular block  nothing to do about that    8.  History of kidney stones twice that he can remember drinking plenty of fluids.  Follow-up with urology    9. Erectile dysfunction and he Wanted an increase in the dose of Cialis  therefore we gave him 20 mg    10.  Sensation of fullness in the throat status post steroid treatment by ENT however no improvement he was supposed to get a panendoscopy by Dr. Rankin we did not discuss this today    11.  Diarrhea 4-5 times after antibiotic treatment for tooth extraction Flagyl  that I gave him did not work he saw gastroenterology they did a colonoscopy that only found 1 polyp him back in 10 years  However he tells me he gets watery diarrhea still intermittently I therefore advised him to go back and talk to the gastroenterologist, which he did and they gave him loperamide  and Metamucil strange enough I told him if he is not happy he should find another gastroenterologist    12.  CKD stage III 8 so far he is quite stable avoid nephrotoxins make sure his blood pressure is well controlled.  I would send him for consultation with Dr. Dwayne from nephrology, he continues to follow-up with them and they continue to tell him everything is stable.  Continue to follow-up with him    13.  Longstanding history of tingling and numbness in his right foot when he is driving less so otherwise I have referred him to neurology for nerve conduction studies but he never went I told him not to worry about it for now I gave him gabapentin  to use on a as needed basis    14.  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 Jiles who is referring him to Dr. Sheppard from vascular surgery.  He does not remember seeing either one of them we will try and get his report    15.  Lumbar spine stenosis/bilateral lower buttock pain in a person who had a history of lumbar discectomy in 2011 this happens in the mornings he wanted something done I would send  him to neurology Dr. Darrick Brew refused MRI of the spine but they did MRI of the buttocks which showed gluteus minimus tendinosis and he was referred to physical therapy  MRI of the lumbar spine was done finally which showed severe spinal stenosis I will refer him to [facility] neurosurgery  In the meantime neurology increased his gabapentin  which is helping  He stopped taking gabapentin  and did not take the duloxetine  that was given to him.  He is seeing a neurosurgeon as well and they finding on EMG  Today he is having lots of pain around the left buttock down the left thigh I gave him gabapentin  diclofenac  and baclofen  and I instructed him to make an appointment with his back surgeon    16.  Bilateral nephrolithiasis with intermittent ureteral stones the last 1 was last month November/2022   He did have ESWL recently and he will follow-up with urology next week    17.  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  Vitamin D  deficiency recheck next visit And take it from there in the meantime he can stop taking his supplements    19.  Acute bronchitis again with negative COVID-19 testing Z-Pak along with Phenergan  DM were given    He is encouraged to get the new COVID-19 vaccine  He also received his flu vaccine and shingles vaccine  Pneumonia vaccine received in 2024    RTC in 3 months       This note was generated by the Epic EMR system/Speech recognition and may contain inherent errors or omissions not intended by the user. Grammatical errors, random word insertions, deletions and pronoun errors  are occasional consequences of this technology due to software limitations.   Not all errors are caught or corrected. If there are questions or concerns about the content of this note or information  contained within the body of this dictation they should be addressed directly with the author for clarification.      Signed by: Estelita DELENA Alias, MD

## 2024-07-06 ENCOUNTER — Encounter (INDEPENDENT_AMBULATORY_CARE_PROVIDER_SITE_OTHER): Payer: Self-pay

## 2024-07-06 ENCOUNTER — Telehealth: Payer: Self-pay | Admitting: Neurological Surgery

## 2024-07-06 NOTE — Telephone Encounter (Signed)
 Copied from CRM [id number]. Topic: Appointment Scheduling - Schedule Appointment  >> Jul 06, 2024  1:47 PM Marin L wrote:  ERIN, UECKER called about Appointment Scheduling - Schedule Appointment.  Additional details:  Patient needs MRI imaging order to be placed for upcoming appointment with provider Dr. Margrette on appointment date 09/09/24 @ 8:30.  Can follow-up please be completed with patient regarding update on order status. Thank you.

## 2024-08-07 ENCOUNTER — Encounter (INDEPENDENT_AMBULATORY_CARE_PROVIDER_SITE_OTHER): Payer: Self-pay

## 2024-08-12 ENCOUNTER — Ambulatory Visit: Admitting: Neurological Surgery

## 2024-08-12 ENCOUNTER — Other Ambulatory Visit: Payer: Self-pay | Admitting: Nurse Practitioner

## 2024-08-12 DIAGNOSIS — M545 Low back pain, unspecified: Secondary | ICD-10-CM

## 2024-08-14 ENCOUNTER — Ambulatory Visit: Admitting: Nurse Practitioner

## 2024-09-09 ENCOUNTER — Ambulatory Visit: Admitting: Neurological Surgery

## 2024-09-12 ENCOUNTER — Other Ambulatory Visit: Payer: Self-pay | Admitting: Internal Medicine

## 2024-09-15 ENCOUNTER — Telehealth: Payer: Self-pay

## 2024-09-15 NOTE — Telephone Encounter (Signed)
 [facility]  [facility]  [facility]  [address]  [place], [place] [address]  T: [phone] F: [phone]    [name]   DOB: May 17, 1956   MRN: [id number]       [facility] Initial Intake Questionnaire  Date of Intake: September 15, 2024    Referring Physician: Zelphia Darlene Other, NP   PCP: Devonne Estelita LABOR, MD    PAIN BACKGROUND:  Where is your pain located? Lower back: bilateral, bilateral sciatica, bilateral legs  When did your pain/symptoms start? After Lumbar fusion 14 yrs ago  Was there a known injury or trauma? no  Do you have numbness/tingling or weakness? If yes, where? R foot numbness  How would you describe your pain/symptoms? stabbing  How severe is your pain on a scale of 0-10? 7  What makes your pain/symptoms WORSE? Foot on break in car, lying in bed on back  What makes your pain/symptoms BETTER? nothing  Have you had recent imaging? Mri lumbar 04/2023    If done outside of [facility], please bring report and/or disc to your appt.  Have you been evaluated by any other specialties (NSGY, Ortho, Rheum, etc)? NSGY    Please bring any records from outside of [facility].      CURRENT/PAST TREATMENTS:  Medications (eg NSAIDS, Tylenol , Muscle Relaxants, Oral Steroids, Nerve Medication/Antidepressants): gabapentin  (unsure), CBD cream (initially helpful)      Physical Therapy (specifically for this pain concern):   Yes or No: no  If yes, please provide details on response to therapy and how long completed:       Injections (eg Epidural, Nerve Blocks, Radiofrequency Ablation, Joint Injection, Trigger Points, Stem Cells, PRP):   Lower back - prior to 2011    Spinal Surgeries:  Lumbar fusion - 2011    OTHER:  Are you taking any blood thinners? Xarelto   Do you have a history of low platelets? yes  Do you require interpretor services? no  Do you have any other concerns? no      Patient has been informed of appointment date, time,  location and scheduled provider. Any and all questions were answered to the patient's satisfaction.

## 2024-09-22 ENCOUNTER — Other Ambulatory Visit: Payer: Self-pay | Admitting: Internal Medicine

## 2024-09-22 ENCOUNTER — Encounter: Payer: Self-pay | Admitting: Internal Medicine

## 2024-09-22 DIAGNOSIS — E559 Vitamin D deficiency, unspecified: Secondary | ICD-10-CM

## 2024-09-22 DIAGNOSIS — E782 Mixed hyperlipidemia: Secondary | ICD-10-CM

## 2024-09-22 DIAGNOSIS — R7303 Prediabetes: Secondary | ICD-10-CM

## 2024-09-22 DIAGNOSIS — N1831 Chronic kidney disease, stage 3a: Secondary | ICD-10-CM

## 2024-09-22 DIAGNOSIS — I10 Essential (primary) hypertension: Secondary | ICD-10-CM

## 2024-09-23 ENCOUNTER — Ambulatory Visit
Admission: RE | Admit: 2024-09-23 | Discharge: 2024-09-23 | Disposition: A | Discharge status: Home / Self Care | Source: Ambulatory Visit | Attending: Nurse Practitioner | Admitting: Nurse Practitioner

## 2024-09-23 ENCOUNTER — Encounter: Payer: Self-pay | Admitting: Physician Assistant

## 2024-09-23 VITALS — BP 134/90 | HR 56

## 2024-09-23 DIAGNOSIS — G8929 Other chronic pain: Secondary | ICD-10-CM | POA: Insufficient documentation

## 2024-09-23 DIAGNOSIS — M5442 Lumbago with sciatica, left side: Secondary | ICD-10-CM | POA: Insufficient documentation

## 2024-09-23 DIAGNOSIS — Z9889 Other specified postprocedural states: Secondary | ICD-10-CM | POA: Insufficient documentation

## 2024-09-23 DIAGNOSIS — M48062 Spinal stenosis, lumbar region with neurogenic claudication: Secondary | ICD-10-CM | POA: Insufficient documentation

## 2024-09-23 DIAGNOSIS — M5441 Lumbago with sciatica, right side: Secondary | ICD-10-CM | POA: Insufficient documentation

## 2024-09-23 DIAGNOSIS — M545 Low back pain, unspecified: Secondary | ICD-10-CM | POA: Insufficient documentation

## 2024-09-23 NOTE — Progress Notes (Signed)
 [facility]  [facility]  [facility]  [address]  [place], [place] [address]  T: [phone] F: [phone]    [name]   DOB: 05/04/1956   MRN: [id number]   Date of Service: September 23, 2024      Referring Physician:  Zelphia Darlene Other, NP  PCP: Devonne Estelita LABOR, MD    Chief Complaint   Patient presents with    Back Pain     LBP  Pain Score: 4/10    Leg Pain     BL          Pain History:  [name] is a 68 y.o. 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  He is referred to our office by neurosurgery (Dr. Margrette) for consideration of L4-5 ESI's.     He reports symptoms were worsening until he started acupuncture 2 weeks ago.  Symptoms now improving.  He would like to defer additional treatment at this time.    Onset: 2011  Inciting event: unknown  Location: Low back  Radiation: BLE - posterior legs, R>L  Quality: Stabbing  Aggravating factors: Driving, laying flat  Alleviating factors: Unknown  Assistive Device: None   Physical Therapy: Doing acupuncture and home exercise program.  Imaging: MRI lumbar spine 05/14/2023, CT lumbar spine 07/12/2023    Anticoagulation: Xarelto     Past Medications Tried and Response:   Gabapentin   Baclofen   Voltaren   CBD cream    Past Pain Injections:   None since prior to surgery in 2011    Past Spinal Surgeries:  January 2011 -right L4-5 laminectomy    Initial Intake Questionnaire   See telephone encounter 09/15/2024    Medical History[1]    Past Surgical History[2]    Social History[3]    Allergies[4]    Current Medications[5]    Review of Systems   Constitutional:  Negative for fever and weight loss.   Respiratory:  Negative for shortness of breath.    Cardiovascular:  Negative for chest pain.   Gastrointestinal:  Negative for constipation and diarrhea.    Musculoskeletal:  Positive for back pain. Negative for neck pain.   Skin:  Negative for rash.   Neurological:  Positive for tingling. Negative for tremors, seizures, loss of consciousness and headaches.   Psychiatric/Behavioral:  Negative for depression. The patient is not nervous/anxious.         PHYSICAL EXAM  Visit Vitals  BP 134/90 (BP Site: Left arm, Patient Position: Sitting, Cuff Size: Large)   Pulse (!) 56   SpO2 99%        Pain Score: 4/10    Constitutional: Well developed, well nourished and in no apparent distress.  Unaccompanied.  Psychiatric: Alert, awake, and oriented. Affect appropriate.  HEENT: atraumatic, mucous membranes moist  Resp: Normal respiratory excursion on RA.  Cardiovascular: warm and well perfused  Skin: Warm, dry, intact. No rashes on visible skin.   MSK/Neuro:   Palpation: Minimally tender over lumbar spine and lumbar paraspinal muscles.   Range of motion:     Lumbar: Normal   Gait: Normal gait without assistive device   Straight leg raise: Negative   Sensory: normal to light touch throughout   Motor (R/L)    Hip Flexion 5/5    Knee Extension 5/5  Knee Flexion 5/5    Dorsiflexion 5/5    Plantarflexion 5/5    EHL 5/5      Pertinent Radiology Studies:   CT LUMBAR SPINE WITHOUT CONTRAST 07/12/2023     HISTORY: Lower back pain.     COMPARISON: Correlation with a prior MRI dated May 14, 2023.     TECHNIQUE: CT of the lumbar spine performed without intravenous contrast.  Multiplanar reformatted images were created and reviewed. The following  dose reduction techniques were utilized: automated exposure control and/or  adjustment of the mA and/or KV according to patient size, and the use of an  iterative reconstruction technique.     CONTRAST: None.     FINDINGS:   There is 2 mm anterolisthesis at the L3-L4 level. The vertebral body  heights are preserved. No acute lumbar spine fractures are seen.     At the L1-L2 level there are slight degenerative facet joint changes. There  is no spinal  canal or neural foraminal narrowing.     At the L2-L3 level the pedicles are congenitally short, there are  degenerative, slightly hypertrophic facet joint changes, there is shallow  broad-based posterior disc bulging. There is mild to moderate spinal canal  and neural foraminal narrowing.     At the L3-L4 level there is broad-based posterior disc bulging, there are  degenerative, hypertrophic facet joint changes, the pedicles are  congenitally short. There is moderate to marked spinal canal narrowing,  moderate neural foraminal narrowing.     At the L4-L5 level there are slight degenerative facet joint changes in the  right side. There are findings suspicious for a laminectomy on the right.  There is broad-based posterior disc bulging which is eccentric to the left  side. There is slight foraminal endplate osteophyte formation. There is  moderate neural foraminal narrowing more prominent on the left, mild spinal  canal narrowing.     At the L5-S1 level there is disc height narrowing, foraminal endplate  osteophyte formation, prominent left L5 lateral mass which forms a  pseudoarticulation with the sacrum. There is mild to moderate narrowing of  the left neural foramen, moderate narrowing of the left neural foramen  which is further narrowed anterolaterally due to a prominent left lateral  mass. There is no spinal canal stenosis.     IMPRESSION:      1.  There are degenerative changes most prominent at the L3-L4, L4-L5  and L5-S1 levels.  2.  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  3.  Neural foraminal narrowing is most prominent at the L4-L5 on both  sides and on the left at the L5-S1 level.    MRI LUMBAR SPINE WITHOUT CONTRAST 05/14/2023     HISTORY: Low back pain. Right toes tingling.     COMPARISON: None available.     TECHNIQUE: MRI of the lumbar spine performed on a 3.0 Tesla scanner without  intravenous  contrast.        CONTRAST: None.     FINDINGS:   There is normal alignment. Vertebral body heights are preserved. Mild edema  of the bilateral L3 pedicles, likely stress related.     The visualized spinal cord is normal signal intensity.       The paraspinal soft tissues are within normal limits.      Evaluation of the individual levels demonstrate:     L1-L2: No significant spinal canal or  significant neural foraminal  stenosis.     L2-L3: Disc bulge and facet arthropathy with ligamentum flavum thickening  result in moderate narrowing of the spinal canal. Mild right and moderate  left neural foraminal stenosis. There is prominence of the dorsal epidural  fat at this level.     L3-L4: Mild disc bulge with severe facet arthropathy and ligamentum flavum  thickening result in severe narrowing of the spinal canal. Mild bilateral  neural foraminal stenosis. There is prominence of the dorsal epidural fat  at this level.     L4-L5: Post surgical changes of right laminectomy with patent spinal canal.  No significant neural foraminal stenosis.     L5-S1: No significant spinal canal or significant neural foraminal  stenosis. There is an enlarged left L5 transverse process which  pseudoarticulates with the sacrum. The pseudoarticulation joint exerts mass  effect on the exiting left L5 nerve root.     IMPRESSION:      1.  Severe spinal canal stenosis at L3-L4 level due to combination of  severe facet arthropathy and ligamentum flavum thickening, prominent dorsal  epidural fat, and mild disc bulge.  2.  Transitional L5. There is an enlarged left L5 transverse process  which articulates with the sacrum. This pseudoarticulation joint exerts  mass effect on the extraforaminal left L5 nerve root.  3.  Additional degenerative changes as detailed by level above.    DIAGNOSIS:  1. Chronic bilateral low back pain with bilateral sciatica    2. Hx of laminectomy    3. Spinal stenosis of lumbar region with neurogenic claudication           Prescriptions:   Orders Placed This Encounter    Referral to Pain Clinic        PLAN:  I have personally performed the history, ROS, physical exam and plan today.  Continue current medication regimen  Continue conservative care including home exercise program and acupuncture.  Consider formal PT if he fails to make progress on his own.  Continue follow up with PCP for management of BMI greater than 25.  Discussed injection options should symptoms worsen.  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   Follow up as needed.         Rollene MICAEL Ree, PA-C    This note was generated in part by the Epic EMR system/ Dragon speech recognition and may contain errors or omissions not intended by the user. Grammatical errors, random word insertions, deletions, pronoun errors and incomplete sentences are occasional consequences of this technology due to software limitations. Not all errors are caught or corrected. If there are questions or concerns about the content of this note or information contained within the body of this dictation they should be addressed directly with the author for clarification.    DATA REVIEWED:  In preparation for today's visit, I have reviewed the patient's most recent office notes with Neurosurgery. I have also independently reviewed any listed radiographic studies. Today's visit did not require independent historian.          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    Hypercholesterolemia May 24, 2008    Hyperplastic colonic polyp 08/12/2018    2017 return in 5 years     Hypertension     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    Mixed hyperlipidemia 08/12/2018    Nephrolithiasis 08/12/2018    x2 last 2018     Neuropathy 09/1 /22    Toes, buttock, and Lower Back    Pain Apr 23, 2020    Pins and needles sensation July 2022    Prediabetes 09/06/2021    Premature ejaculation 08/12/2018    Preop examination 09/29/2019    Routine history and physical examination of adult 03/15/2023    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/14/2023    Stage 3a chronic kidney disease (CMS/HCC) 09/09/2020    Throat fullness 08/28/2019   [2]   Past Surgical History:  Procedure Laterality Date    BACK SURGERY  Dec 29, 2009    BUNIONECTOMY  02/09/2015    COLONOSCOPY, DIAGNOSTIC (SCREENING)  07/2007    left extracorporeal shockwave  11/09/2022    VASECTOMY  05/1992   [3]   Social History  Tobacco Use    Smoking status: Never     Passive exposure: Never    Smokeless tobacco: Never    Tobacco comments:     Passing  faze   Vaping Use    Vaping status: Some Days   Substance Use Topics    Alcohol use: Yes     Alcohol/week: 16.0 standard drinks of alcohol     Types: 10 Glasses of wine, 2 Cans of beer, 1 Shots of liquor, 3 Standard drinks or equivalent per week     Comment: 1 glass daily    Drug use: Not Currently     Types: Marijuana, Mescaline, PCP   [4] No Known Allergies  [5]   Current Outpatient Medications   Medication Sig Dispense Refill    Cholecalciferol (vitamin D3) 125 MCG (5000 UT) tablet Take 1 tablet (5,000 Units) by mouth daily      evolocumab  (Repatha  SureClick) 140 MG/ML subcutaneous auto-injector INJECT 1 ML INTO THE SKIN EVERY 14 DAYS 6 each 0    gabapentin  (NEURONTIN ) 300 MG capsule Take 1 capsule (300 mg) by mouth once at bedtime 30 capsule 1    Multiple Vitamins-Minerals (MULTIVITAMIN WITH MINERALS) tablet Take 1 tablet by mouth daily      sildenafil  (VIAGRA ) 100 MG tablet TAKE 1 TABLET BY MOUTH EVERY DAY AS NEEDED FOR 30 DAYS      valACYclovir  HCL (VALTREX ) 500  MG tablet TAKE 1 TABLET (500 MG) BY MOUTH DAILY AS NEEDED (ERUPTION) 90 tablet 0    valsartan -hydroCHLOROthiazide  (DIOVAN -HCT) 320-25 MG per tablet TAKE 1 TABLET BY MOUTH EVERY DAY 90 tablet 1    Xarelto  10 MG Tab Take 1 tablet (10 mg) by mouth daily      promethazine -dextromethorphan (PROMETHAZINE -DM) 6.25-15 MG/5ML syrup Take 5 mLs by mouth 4 (four) times daily as needed for Cough 180 mL 0     No current facility-administered medications for this encounter.

## 2024-09-29 LAB — CBC AND DIFFERENTIAL
Baso(Absolute): 0 x10E3/uL (ref 0.0–0.2)
Basophils Automated: 0 %
Eosinophils Absolute: 0.1 x10E3/uL (ref 0.0–0.4)
Eosinophils Automated: 3 %
Hematocrit: 52.5 % — ABNORMAL HIGH (ref 37.5–51.0)
Hemoglobin: 17.3 g/dL (ref 13.0–17.7)
Immature Granulocytes Absolute: 0 x10E3/uL (ref 0.0–0.1)
Immature Granulocytes: 0 %
Lymphocytes Absolute: 1.7 x10E3/uL (ref 0.7–3.1)
Lymphocytes Automated: 48 %
MCH: 30.6 pg (ref 26.6–33.0)
MCHC: 33 g/dL (ref 31.5–35.7)
MCV: 93 fL (ref 79–97)
Monocytes Absolute: 0.7 x10E3/uL (ref 0.1–0.9)
Monocytes: 20 %
Neutrophils Absolute Count: 1 x10E3/uL — ABNORMAL LOW (ref 1.4–7.0)
Neutrophils: 29 %
Platelets: 133 x10E3/uL — ABNORMAL LOW (ref 150–450)
RBC: 5.65 x10E6/uL (ref 4.14–5.80)
RDW: 13.1 % (ref 11.6–15.4)
WBC: 3.4 x10E3/uL (ref 3.4–10.8)

## 2024-09-29 LAB — PSA, TOTAL AND FREE
PSA, Free Pct: 24.6 %
PSA, Free: 0.32 ng/mL
Prostate Specific Antigen, Total: 1.3 ng/mL (ref 0.0–4.0)

## 2024-09-29 LAB — COMPREHENSIVE METABOLIC PANEL
ALT: 31 IU/L (ref 0–44)
AST (SGOT): 31 IU/L (ref 0–40)
Albumin: 4.4 g/dL (ref 3.9–4.9)
Alkaline Phosphatase: 122 IU/L (ref 47–123)
BUN / Creatinine Ratio: 13 (ref 10–24)
BUN: 18 mg/dL (ref 8–27)
Bilirubin, Total: 0.5 mg/dL (ref 0.0–1.2)
CO2: 25 mmol/L (ref 20–29)
Calcium: 9.8 mg/dL (ref 8.6–10.2)
Chloride: 101 mmol/L (ref 96–106)
Creatinine: 1.41 mg/dL — ABNORMAL HIGH (ref 0.76–1.27)
Globulin, Total: 2.4 g/dL (ref 1.5–4.5)
Glucose: 115 mg/dL — ABNORMAL HIGH (ref 70–99)
Potassium: 4.1 mmol/L (ref 3.5–5.2)
Protein, Total: 6.8 g/dL (ref 6.0–8.5)
Sodium: 141 mmol/L (ref 134–144)
eGFR: 55 mL/min/1.73 — ABNORMAL LOW (ref >59)

## 2024-09-29 LAB — LIPID PANEL
Cholesterol / HDL Ratio: 2.4 ratio (ref 0.0–5.0)
Cholesterol: 189 mg/dL (ref 100–199)
HDL: 80 mg/dL (ref >39)
LDL Chol Calculated (NIH): 96 mg/dL (ref 0–99)
Triglycerides: 72 mg/dL (ref 0–149)
VLDL Calculated: 13 mg/dL (ref 5–40)

## 2024-09-29 LAB — HEMOGLOBIN A1C: Hemoglobin A1C: 5.7 % — ABNORMAL HIGH (ref 4.8–5.6)

## 2024-09-29 LAB — RENAL FUNCTION PANEL: Phosphorus: 3 mg/dL (ref 2.8–4.1)

## 2024-09-29 LAB — VITAMIN D, 25 OH, TOTAL: Vitamin D 25-Hydroxy: 61.4 ng/mL (ref 30.0–100.0)

## 2024-10-01 ENCOUNTER — Encounter: Payer: Self-pay | Admitting: Neurological Surgery

## 2024-10-01 ENCOUNTER — Encounter: Payer: Self-pay | Admitting: Internal Medicine

## 2024-10-02 ENCOUNTER — Encounter: Payer: Self-pay | Admitting: Internal Medicine

## 2024-10-02 ENCOUNTER — Ambulatory Visit: Admitting: Internal Medicine

## 2024-10-02 VITALS — BP 131/82 | HR 60 | Temp 97.3°F | Ht 71.0 in | Wt 208.0 lb

## 2024-10-02 DIAGNOSIS — N1831 Chronic kidney disease, stage 3a: Secondary | ICD-10-CM

## 2024-10-02 DIAGNOSIS — M48062 Spinal stenosis, lumbar region with neurogenic claudication: Secondary | ICD-10-CM

## 2024-10-02 DIAGNOSIS — I10 Essential (primary) hypertension: Secondary | ICD-10-CM

## 2024-10-02 DIAGNOSIS — R7303 Prediabetes: Secondary | ICD-10-CM

## 2024-10-02 DIAGNOSIS — E782 Mixed hyperlipidemia: Secondary | ICD-10-CM

## 2024-10-02 MED ORDER — FLUOCINONIDE 0.05 % EX CREA: TOPICAL_CREAM | Freq: Two times a day (BID) | CUTANEOUS | 1 refills | Status: AC

## 2024-10-02 NOTE — Progress Notes (Signed)
 Date Time: 10/02/2024 9:34 AM  Patient Name: [name], [name]   DOB: [birth year]    Subjective:   [name] is a 68 y.o. male who presents for routine medical follow-up visit    He is known to have a history of DVT of the right leg twice in 2018, hypertension, left anterior fascicular block, mixed hyperlipidemia, colonic polyps, bilateral nephrolithiasis, history of herpes simplex infection of the penis, history of stage IIIa chronic kidney disease, history of lumbar discectomy, history of vasectomy, major depressive disorder, history of prediabetes    (Historically)  In December 2020 he had a tooth problem and extraction and he was given antibiotics for about a week  Since then he has been having daily 4-5 times diarrhea without blood or mucus but he has some tenesmus he said  I gave him a course of Flagyl  this did not help he went to gastroenterology and he had a colonoscopy they found 1 polyp which was not malignant but told him to come back in 10 years  History has intermittent diarrhea and they gave him loperamide     His blood work shows that his cholesterol is high and his blood pressure is still high as well    06/03/2020  He feels very good in himself he has no breathing troubles or chest pains no bowel or urinary problems his medications were reviewed with him he has tolerated the higher dose of the valsartan  and its time to check his kidney function    09/09/2020  The gastroenterologist gave him loperamide  for his intermittent loose stools and he gave him Metamucil which she has not taken  When he drives the toes in his right foot become a little bit numb and sometimes painful he has to shake his foot that everything is okay he has had DVT in that leg and he was worried  He has been taking his fenofibrate  along with rosuvastatin  and its time to recheck  He has not been taking Zoloft  for his premature ejaculation on a routine basis only as needed which I told him of the wrong way    01/31/2021  He  feels very good in himself has no breathing troubles chest pain bowel or urinary problems  However he complains of tingling and numbness in his foot when he is driving less so otherwise    06/02/2021  He does not have a refill on his Valtrex  and unfortunately he has an outbreak and he did not have the medicines  He is trying to exercise and lose weight he is not very successful unfortunately  No changes to the rest of his medications no breathing trouble chest pain no bowel or urinary problems  Medications reviewed and reconciled    09/06/2021  He did see the pain management doctors we did a CAT scan and 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 Jiles who referred him to Dr. Sheppard who is a vascular surgeon and  He has no PND orthopnea chest pain or claudication    12/06/2021  He had to go to the emergency room the other day they did find another stone in the ureter he is known to have bilateral kidney stones anyways  He complains of lower buttock pain when he wakes up in the morning he wants something done about that he had had lumbar  spine surgery in the past discectomy in 2011  Medications were reviewed and reconciled    03/07/2022  He did see the nephrologist the urologist and the neurologist  No changes have been made to his medications which were reviewed and reconciled by myself  He continues to complain of 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 I told him really there is not much to do unless he really wants Botox injections    06/06/2022  Unfortunately in spite of Crestor  and fenofibrate  his cholesterol is still at 168 LDL  No breathing troubles or chest pains no bowel or urinary problems he has no other complaints    10/24/2022  He is here for preop clearance  He is going for an ESWL in a couple of weeks  Since I saw him last time no new complaints he  continues on taking all of his current medications without any problems    12/07/2022  He did have his ESWL and he has a follow-up appointment next week  He continues to complain of the neuropathic pain in his right foot when he is driving or sometimes even at nighttime  Medications all reviewed and reconciled with him    12/28/2022  For about 5 days he has been having a hacking cough runny nose sinus congestion body aches fevers and chills and he tested 3 times for COVID-19 and he was negative over-the-counter medications are not helping very much    03/15/2023  This is his annual physical examination  He continues to have some complaints regarding his buttocks his right trochanteric region and his right groin mostly when he sleeps at nighttime  He continues to follow-up with hematology and nephrology  No breathing troubles or chest pains no bowel or urinary problems  Medications reviewed and reconciled  Unfortunately the ESWL that he had for his renal stone failed they offered him surgery and he declined    06/14/2023  The nephrologist discharged him from his care only annual visits  The hematologist cut his Xarelto  from 20 to 10 mg  The neurologist found severe lumbar spine stenosis causing his bilateral hip pain and now he is looking for a neurosurgeon    09/13/2023  His labs show stable renal function  He saw neurology and neurosurgery and they are planning on an EMG they gave him duloxetine  and gabapentin  but he declined to take them the pharmacist scared him about them and he is using a topical cannabinoid and he says it is working    12/26/2023  He did see the neurologist and the neurologist recently and before that the nephrologist everything is okay no changes to his current medications he needs some medications refilled  He has no new complaints as a matter fact    03/27/2024  He thinks that his hearing is getting a little bit worse and that he might go and check with [place] hearing aid [place]  No breathing troubles or chest pains no bowel or urinary problems  Medications reviewed and reconciled with him    04/23/2024  This is yet another episode of cough sneezing sore throat aches and pains and expectoration and he tested negative for COVID-19    07/03/2024  For the last few days he has been having left buttock pain going down on the backside of the left thigh and he does not have any more gabapentin  and other medications  These have all been reviewed and reconciled with him  His labs were also reviewed with him    10/02/2024  He continues to complain of his low back and sciatic pain  The neurosurgeon referred him to pain management and they has given gabapentin  which he cannot take because it makes him sleepy  He did have blood work recently which continues to show stage IIIa chronic kidney disease but otherwise everything else looks fine  Medications were reviewed and reconciled    Past Medical History:     Past Medical History:   Diagnosis Date    Acute bronchitis due to other specified organisms 12/28/2022    Adenomatous polyp of colon 08/28/2019    COVID-19 virus infection 08/28/2019    07/06/2019    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 (CMS/HCC) 08/12/2018    x2 last 2018    Functional diarrhea 06/03/2020    H/O lumbar discectomy 08/12/2018    2011     H/O: vasectomy 08/12/2018    Hearing loss     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    Hypercholesterolemia May 24, 2008    Hyperplastic colonic polyp 08/12/2018    2017 return in 5 years    Hypertension     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    Mixed hyperlipidemia 08/12/2018    Nephrolithiasis 08/12/2018    x2 last 2018     Neuropathy 09/1 /22    Toes, buttock, and Lower Back    Pain Apr 23, 2020    Pins and needles sensation July 2022    Prediabetes 09/06/2021    Premature ejaculation 08/12/2018    Preop examination 09/29/2019    Routine history and physical examination of adult 03/15/2023    Spinal  stenosis of lumbar region with neurogenic claudication 06/14/2023    Stage 3a chronic kidney disease (CMS/HCC) 09/09/2020    Throat fullness 08/28/2019       Past Surgical History:     Past Surgical History:   Procedure Laterality Date    BACK SURGERY  Dec 29, 2009    BUNIONECTOMY  02/09/2015    COLONOSCOPY, DIAGNOSTIC (SCREENING)  07/2007    left extracorporeal shockwave  11/09/2022    VASECTOMY  05/1992       Family History:     Family History   Problem Relation Name Age of Onset    Stroke Mother Landynn Dupler     Hypertension Mother Macoy Rodwell     Clotting disorder Mother Krishav Mamone     Heart disease Father John        Social History:     Social History     Socioeconomic History    Marital status: Significant Other   Tobacco Use    Smoking status: Never     Passive exposure: Never    Smokeless tobacco: Never    Tobacco comments:     Passing  faze   Vaping Use    Vaping status: Some Days   Substance and Sexual Activity    Alcohol use: Yes     Alcohol/week: 4.0 - 14.0 standard drinks of alcohol     Types: 4 - 14 Standard drinks or equivalent per week     Comment: 1 glass daily    Drug use: Not Currently     Types: Marijuana, Mescaline, PCP    Sexual activity: Yes     Partners: Female     Birth control/protection: Surgical     Social Drivers of Psychologist, prison and probation services  Strain: Low Risk (10/01/2024)    Overall Financial Resource Strain (CARDIA)     Difficulty of Paying Living Expenses: Not hard at all   Food Insecurity: No Food Insecurity (10/01/2024)    Hunger Vital Sign     Worried About Running Out of Food in the Last Year: Never true     Ran Out of Food in the Last Year: Never true   Transportation Needs: No Transportation Needs (10/01/2024)    PRAPARE - Therapist, art (Medical): No     Lack of Transportation (Non-Medical): No   Physical Activity: Sufficiently Active (10/01/2024)    Exercise Vital Sign     Days of Exercise per Week: 4 days     Minutes of Exercise per  Session: 60 min   Stress: No Stress Concern Present (10/01/2024)    [employer] of Occupational Health - Occupational Stress Questionnaire     Feeling of Stress : Only a little   Social Connections: Socially Isolated (06/30/2023)    Social Connection and Isolation Panel     Frequency of Communication with Friends and Family: More than three times a week     Frequency of Social Gatherings with Friends and Family: Once a week     Attends Religious Services: Never     Database administrator or Organizations: No     Attends Banker Meetings: Never     Marital Status: Divorced   Catering manager Violence: Not At Risk (10/01/2024)    Humiliation, Afraid, Rape, and Kick questionnaire     Fear of Current or Ex-Partner: No     Emotionally Abused: No     Physically Abused: No     Sexually Abused: No   Housing Stability: Not At Risk (10/01/2024)    Housing Stability NCSS     Do you have housing?: Yes     Are you worried about losing your housing?: No       Allergies:   No Known Allergies    Medications:     Current Outpatient Medications   Medication Sig Dispense Refill    Cholecalciferol (vitamin D3) 125 MCG (5000 UT) tablet Take 1 tablet (5,000 Units) by mouth daily      evolocumab  (Repatha  SureClick) 140 MG/ML subcutaneous auto-injector INJECT 1 ML INTO THE SKIN EVERY 14 DAYS 6 each 0    gabapentin  (NEURONTIN ) 300 MG capsule Take 1 capsule (300 mg) by mouth once at bedtime 30 capsule 1    Multiple Vitamins-Minerals (MULTIVITAMIN WITH MINERALS) tablet Take 1 tablet by mouth daily      sildenafil  (VIAGRA ) 100 MG tablet TAKE 1 TABLET BY MOUTH EVERY DAY AS NEEDED FOR 30 DAYS      valACYclovir  HCL (VALTREX ) 500 MG tablet TAKE 1 TABLET (500 MG) BY MOUTH DAILY AS NEEDED (ERUPTION) 90 tablet 0    valsartan -hydroCHLOROthiazide  (DIOVAN -HCT) 320-25 MG per tablet TAKE 1 TABLET BY MOUTH EVERY DAY 90 tablet 1    Xarelto  10 MG Tab Take 1 tablet (10 mg) by mouth daily      fluocinonide  (LIDEX ) 0.05 % cream Apply topically 2  (two) times daily 30 g 1     No current facility-administered medications for this visit.       Review of Systems:   A comprehensive review of systems was:   General ROS: Some hearing issues  Respiratory ROS: No cough shortness of breath or wheezing cardiovascular ROS: no chest pain or dyspnea on exertion  Gastrointestinal  ROS: occasional intermittent diarrhea  Genito-Urinary ROS: no dysuria, trouble voiding, or hematuria. Recurrent herpes simplex attacks  Musculoskeletal ROS: Bilateral lower buttock pain intermittently  neurological ROS: Intermittent numbness of the right toes while driving mostly but at nighttime also  Dermatologic ROS: Chronic bilateral hand intermittent hyperhidrosis    Physical Exam:     Vitals:    10/02/24 0834   BP: 131/82   Pulse: 60   Temp: 97.3 F (36.3 C)   SpO2: 99%         BP Readings from Last 3 Encounters:   10/02/24 131/82   09/23/24 134/90   07/03/24 103/70     Wt Readings from Last 3 Encounters:   10/02/24 94.3 kg (208 lb)   07/03/24 94.3 kg (208 lb)   03/27/24 93 kg (205 lb)   Body mass index is 29.01 kg/m.    General appearance - alert, well appearing, and in no distress  Chest - clear to auscultation, no wheezes, rales or rhonchi, symmetric air entry  Heart - normal rate, regular rhythm, normal S1, S2, no murmurs, rubs, clicks or gallops  Abdomen - soft, nontender, nondistended, no masses or organomegaly  Neurological - alert, oriented, normal speech, no focal findings or movement disorder noted  Extremities - peripheral pulses normal,  no clubbing or cyanosis   trace edema and variceal dermatitis around the right ankle     ECG shows normal sinus rhythm    Labs:     Recent Results (from the past 12 weeks)   Comprehensive Metabolic Panel    Collection Time: 09/28/24  8:26 AM   Result Value Ref Range    Glucose 115 (H) 70 - 99 mg/dL    BUN 18 8 - 27 mg/dL    Creatinine 8.58 (H) 0.76 - 1.27 mg/dL    eGFR 55 (L) >40 fO/fpw/8.26    BUN / Creatinine Ratio 13 10 - 24    Sodium 141  134 - 144 mmol/L    Potassium 4.1 3.5 - 5.2 mmol/L    Chloride 101 96 - 106 mmol/L    CO2 25 20 - 29 mmol/L    Calcium  9.8 8.6 - 10.2 mg/dL    Protein, Total 6.8 6.0 - 8.5 g/dL    Albumin 4.4 3.9 - 4.9 g/dL    Globulin, Total 2.4 1.5 - 4.5 g/dL    Bilirubin, Total 0.5 0.0 - 1.2 mg/dL    Alkaline Phosphatase 122 47 - 123 IU/L    AST (SGOT) 31 0 - 40 IU/L    ALT 31 0 - 44 IU/L   Lipid Panel    Collection Time: 09/28/24  8:26 AM   Result Value Ref Range    Cholesterol 189 100 - 199 mg/dL    Triglycerides 72 0 - 149 mg/dL    HDL 80 >60 mg/dL    VLDL Calculated 13 5 - 40 mg/dL    LDL Chol Calculated (NIH) 96 0 - 99 mg/dL    Cholesterol / HDL Ratio 2.4 0.0 - 5.0 ratio   Vitamin D , 25 OH, Total    Collection Time: 09/28/24  8:26 AM   Result Value Ref Range    Vitamin D  25-Hydroxy 61.4 30.0 - 100.0 ng/mL   CBC with Differential (Order)    Collection Time: 09/28/24  8:26 AM   Result Value Ref Range    WBC 3.4 3.4 - 10.8 x10E3/uL    RBC 5.65 4.14 - 5.80 x10E6/uL    Hemoglobin 17.3 13.0 -  17.7 g/dL    Hematocrit 47.4 (H) 37.5 - 51.0 %    MCV 93 79 - 97 fL    MCH 30.6 26.6 - 33.0 pg    MCHC 33.0 31.5 - 35.7 g/dL    RDW 86.8 88.3 - 84.5 %    Platelets 133 (L) 150 - 450 x10E3/uL    Neutrophils 29 Not Estab. %    Lymphocytes Automated 48 Not Estab. %    Monocytes 20 Not Estab. %    Eosinophils Automated 3 Not Estab. %    Basophils Automated 0 Not Estab. %    Neutrophils Absolute Count 1.0 (L) 1.4 - 7.0 x10E3/uL    Lymphocytes Absolute 1.7 0.7 - 3.1 x10E3/uL    Monocytes Absolute 0.7 0.1 - 0.9 x10E3/uL    Eosinophils Absolute 0.1 0.0 - 0.4 x10E3/uL    Baso(Absolute) 0.0 0.0 - 0.2 x10E3/uL    Immature Granulocytes 0 Not Estab. %    Immature Granulocytes Absolute 0.0 0.0 - 0.1 x10E3/uL   Renal Function Panel    Collection Time: 09/28/24  8:26 AM   Result Value Ref Range    Phosphorus 3.0 2.8 - 4.1 mg/dL   PSA, Total And Free    Collection Time: 09/28/24  8:26 AM   Result Value Ref Range    Prostate Specific Antigen, Total 1.3 0.0 -  4.0 ng/mL    PSA, Free 0.32 N/A ng/mL    PSA, Free Pct 24.6 %   Hemoglobin A1C    Collection Time: 09/28/24  8:26 AM   Result Value Ref Range    Hemoglobin A1C 5.7 (H) 4.8 - 5.6 %             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 (CMS/HCC)    Hypertension    H/O lumbar discectomy    Bilateral nephrolithiasis    Mixed hyperlipidemia    Premature ejaculation    Herpes simplex infection of penis    LAFB (left anterior fascicular block)    Throat fullness    COVID-19 virus infection    Adenomatous polyp of colon    Functional diarrhea    Major depressive disorder, recurrent episode, mild    Major depressive disorder, recurrent episode, moderate (CMS/HCC)    Recurrent major depression    Stage 3a chronic kidney disease (CMS/HCC)    Prediabetes    Acute bronchitis due to other specified organisms    Spinal stenosis of lumbar region with neurogenic claudication    Neutropenia, unspecified type    Thrombocytopenia       No orders of the defined types were placed in this encounter.        1. History of recurrent right lower extremity DVT he is on Xarelto  and he has intermittent trace edema and chronic varicose dermatitis  I made him a referral for consultation with Dr. Shellia from hematology  I told him this is lifelong    2. Hyperlipidemia Not well controlled he is supposed to be on Crestor  40 and TriCor  however he does not have Crestor  for quite a while now that he is back on his medications for 3 months with an LDL of 168 we will discontinue both and start him on Repatha  140 mg every 2 weeks if his insurance accepts  And his lipid profile is improved a lot  3.  Recurrent genital herpes about once a year and he gets Valtrex  for 5 days new prescription was given    4.  Hypertension not well-controlled because of poor compliance I admonished his behavior  Valsartan  was increased last time to  320/25 and the levels are much better now    5.  History of premature ejaculation and he uses Zoloft  for that, And Viagra  for erectile dysfunction  He was taking Zoloft  as needed I told him he needs to take it every day    6.  History of colonic polyps last colonoscopy was in 2017 and repeat colonoscopy in 01/2020 if on only 1 polyp nonmalignant told him to come back in 10 years    7.  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  History of kidney stones twice that he can remember drinking plenty of fluids.  Follow-up with urology    9. Erectile dysfunction and he Wanted an increase in the dose of Cialis  therefore we gave him 20 mg    10.  Sensation of fullness in the throat status post steroid treatment by ENT however no improvement he was supposed to get a panendoscopy by Dr. Rankin we did not discuss this today    11.  Diarrhea 4-5 times after antibiotic treatment for tooth extraction Flagyl  that I gave him did not work he saw gastroenterology they did a colonoscopy that only found 1 polyp him back in 10 years  However he tells me he gets watery diarrhea still intermittently I therefore advised him to go back and talk to the gastroenterologist, which he did and they gave him loperamide  and Metamucil strange enough I told him if he is not happy he should find another gastroenterologist    12.  CKD stage III 8 so far he is quite stable avoid nephrotoxins make sure his blood pressure is well controlled.  I would send him for consultation with Dr. Dwayne from nephrology, he continues to follow-up with them and they continue to tell him everything is stable.  Continue to follow-up with him    13.  Longstanding history of tingling and numbness in his right foot when he is driving less so otherwise I have referred him to neurology for nerve conduction studies but he never went I told him not to worry about it for now I gave him gabapentin  to use on a as needed basis    14.  Aortic infrarenal  dissection he thinks that this is longstanding ever since he was a patient with [facility] he was seen by Dr. Jiles who is referring him to Dr. Sheppard from vascular surgery.  He does not remember seeing either one of them we will try and get his report    15.  Lumbar spine stenosis/bilateral lower buttock pain in a person who had a history of lumbar discectomy in 2011 this happens in the mornings he wanted something done I would send him to neurology Dr. Darrick Brew refused MRI of the spine but they did MRI of the buttocks which showed gluteus minimus tendinosis and he was referred to physical therapy  MRI of the lumbar spine was done finally which showed severe spinal stenosis I will refer him to [facility] neurosurgery  In the meantime neurology increased his gabapentin  which is helping  He stopped taking gabapentin  and did not take the duloxetine  that was given to him.  He is seeing a neurosurgeon as well and they finding on  EMG  Today he is having lots of pain around the left buttock down the left thigh I gave him gabapentin  diclofenac  and baclofen  and I instructed him to make an appointment with his back surgeon  The neurosurgeon sent him back to pain management because there is nothing surgical for him to do they give him gabapentin  which he does not take because it makes him sleepy I told him to go back to the pain management and let them deal with this    16.  Bilateral nephrolithiasis with intermittent ureteral stones the last 1 was last month November/2022   He did have ESWL recently and he will follow-up with urology next week    17.  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  Vitamin D  deficiency recheck next visit And take it from there in the meantime he can stop taking his supplements    He  is up-to-date with all immunizations    RTC in 3 months       This note was generated by the Epic EMR system/Speech recognition and may contain inherent errors or omissions not intended by the user. Grammatical errors, random word insertions, deletions and pronoun errors  are occasional consequences of this technology due to software limitations.   Not all errors are caught or corrected. If there are questions or concerns about the content of this note or information contained within the body of this dictation they should be addressed directly with the author for clarification.      Signed by: Estelita DELENA Alias, MD

## 2024-10-07 ENCOUNTER — Ambulatory Visit: Admitting: Neurological Surgery

## 2024-11-12 ENCOUNTER — Ambulatory Visit: Admitting: Neurological Surgery

## 2024-11-13 ENCOUNTER — Ambulatory Visit: Admitting: Internal Medicine

## 2024-11-13 ENCOUNTER — Encounter: Payer: Self-pay | Admitting: Internal Medicine

## 2024-11-13 VITALS — BP 124/82 | HR 69 | Temp 97.6°F | Ht 71.0 in | Wt 211.0 lb

## 2024-11-13 DIAGNOSIS — E782 Mixed hyperlipidemia: Secondary | ICD-10-CM

## 2024-11-13 DIAGNOSIS — I10 Essential (primary) hypertension: Secondary | ICD-10-CM

## 2024-11-13 DIAGNOSIS — M48062 Spinal stenosis, lumbar region with neurogenic claudication: Secondary | ICD-10-CM

## 2024-11-13 MED ORDER — VALSARTAN-HYDROCHLOROTHIAZIDE 320-25 MG PO TABS
1.0000 | ORAL_TABLET | Freq: Every day | ORAL | 1 refills | Status: AC
Start: 2024-11-13 — End: ?

## 2024-11-13 MED ORDER — REPATHA SURECLICK 140 MG/ML SC SOAJ
SUBCUTANEOUS | 0 refills | Status: AC
Start: 2024-11-13 — End: ?

## 2024-11-13 NOTE — Progress Notes (Signed)
 Date Time: 11/13/2024 12:37 PM  Patient Name: [name], [name]   DOB: 1956-11-21    Subjective:   Ewan Grau is a 68 y.o. male who presents for routine medical follow-up visit    He is known to have a history of DVT of the right leg twice in 2018, hypertension, left anterior fascicular block, mixed hyperlipidemia, colonic polyps, bilateral nephrolithiasis, history of herpes simplex infection of the penis, history of stage IIIa chronic kidney disease, history of lumbar discectomy, history of vasectomy, major depressive disorder, history of prediabetes    (Historically)  In December 2020 he had a tooth problem and extraction and he was given antibiotics for about a week  Since then he has been having daily 4-5 times diarrhea without blood or mucus but he has some tenesmus he said  I gave him a course of Flagyl  this did not help he went to gastroenterology and he had a colonoscopy they found 1 polyp which was not malignant but told him to come back in 10 years  History has intermittent diarrhea and they gave him loperamide     His blood work shows that his cholesterol is high and his blood pressure is still high as well    06/03/2020  He feels very good in himself he has no breathing troubles or chest pains no bowel or urinary problems his medications were reviewed with him he has tolerated the higher dose of the valsartan  and its time to check his kidney function    09/09/2020  The gastroenterologist gave him loperamide  for his intermittent loose stools and he gave him Metamucil which she has not taken  When he drives the toes in his right foot become a little bit numb and sometimes painful he has to shake his foot that everything is okay he has had DVT in that leg and he was worried  He has been taking his fenofibrate  along with rosuvastatin  and its time to recheck  He has not been taking Zoloft  for his premature ejaculation on a routine basis only as needed which I told him of the wrong way    01/31/2021  He  feels very good in himself has no breathing troubles chest pain bowel or urinary problems  However he complains of tingling and numbness in his foot when he is driving less so otherwise    06/02/2021  He does not have a refill on his Valtrex  and unfortunately he has an outbreak and he did not have the medicines  He is trying to exercise and lose weight he is not very successful unfortunately  No changes to the rest of his medications no breathing trouble chest pain no bowel or urinary problems  Medications reviewed and reconciled    09/06/2021  He did see the pain management doctors we did a CAT scan and 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 Jiles who referred him to Dr. Sheppard who is a vascular surgeon and  He has no PND orthopnea chest pain or claudication    12/06/2021  He had to go to the emergency room the other day they did find another stone in the ureter he is known to have bilateral kidney stones anyways  He complains of lower buttock pain when he wakes up in the morning he wants something done about that he had had lumbar  spine surgery in the past discectomy in 2011  Medications were reviewed and reconciled    03/07/2022  He did see the nephrologist the urologist and the neurologist  No changes have been made to his medications which were reviewed and reconciled by myself  He continues to complain of 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 I told him really there is not much to do unless he really wants Botox injections    06/06/2022  Unfortunately in spite of Crestor  and fenofibrate  his cholesterol is still at 168 LDL  No breathing troubles or chest pains no bowel or urinary problems he has no other complaints    10/24/2022  He is here for preop clearance  He is going for an ESWL in a couple of weeks  Since I saw him last time no new complaints he  continues on taking all of his current medications without any problems    12/07/2022  He did have his ESWL and he has a follow-up appointment next week  He continues to complain of the neuropathic pain in his right foot when he is driving or sometimes even at nighttime  Medications all reviewed and reconciled with him    12/28/2022  For about 5 days he has been having a hacking cough runny nose sinus congestion body aches fevers and chills and he tested 3 times for COVID-19 and he was negative over-the-counter medications are not helping very much    03/15/2023  This is his annual physical examination  He continues to have some complaints regarding his buttocks his right trochanteric region and his right groin mostly when he sleeps at nighttime  He continues to follow-up with hematology and nephrology  No breathing troubles or chest pains no bowel or urinary problems  Medications reviewed and reconciled  Unfortunately the ESWL that he had for his renal stone failed they offered him surgery and he declined    06/14/2023  The nephrologist discharged him from his care only annual visits  The hematologist cut his Xarelto  from 20 to 10 mg  The neurologist found severe lumbar spine stenosis causing his bilateral hip pain and now he is looking for a neurosurgeon    09/13/2023  His labs show stable renal function  He saw neurology and neurosurgery and they are planning on an EMG they gave him duloxetine  and gabapentin  but he declined to take them the pharmacist scared him about them and he is using a topical cannabinoid and he says it is working    12/26/2023  He did see the neurologist and the neurologist recently and before that the nephrologist everything is okay no changes to his current medications he needs some medications refilled  He has no new complaints as a matter fact    03/27/2024  He thinks that his hearing is getting a little bit worse and that he might go and check with [place] aid [place]  No breathing troubles or chest pains no bowel or urinary problems  Medications reviewed and reconciled with him    04/23/2024  This is yet another episode of cough sneezing sore throat aches and pains and expectoration and he tested negative for COVID-19    07/03/2024  For the last few days he has been having left buttock pain going down on the backside of the left thigh and he does not have any more gabapentin  and other medications  These have all been reviewed and reconciled with him  His labs were also reviewed with him    10/02/2024  He continues to complain of his low back and sciatic pain  The neurosurgeon referred him to pain management and they has given gabapentin  which he cannot take because it makes him sleepy  He did have blood work recently which continues to show stage IIIa chronic kidney disease but otherwise everything else looks fine  Medications were reviewed and reconciled    11/13/2024  His neurosurgeon wants him to get an epidural but the epidural people want him to have a repeat x-ray CT or MRI of his lumbar spine the last 1 was a year ago    Past Medical History:     Past Medical History:   Diagnosis Date    Acute bronchitis due to other specified organisms 12/28/2022    Adenomatous polyp of colon 08/28/2019    COVID-19 virus infection 08/28/2019    07/06/2019    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 (CMS/HCC) 08/12/2018    x2 last 2018    Functional diarrhea 06/03/2020    H/O lumbar discectomy 08/12/2018    2011     H/O: vasectomy 08/12/2018    Hearing loss     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    Hypercholesterolemia May 24, 2008    Hyperplastic colonic polyp 08/12/2018    2017 return in 5 years    Hypertension     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    Mixed hyperlipidemia 08/12/2018    Nephrolithiasis 08/12/2018    x2 last 2018     Neuropathy 09/1 /22    Toes, buttock, and Lower Back    Pain Apr 23, 2020    Pins and needles sensation July  2022    Prediabetes 09/06/2021    Premature ejaculation 08/12/2018    Preop examination 09/29/2019    Routine history and physical examination of adult 03/15/2023    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/14/2023    Stage 3a chronic kidney disease (CMS/HCC) 09/09/2020    Throat fullness 08/28/2019       Past Surgical History:     Past Surgical History:   Procedure Laterality Date    BACK SURGERY  Dec 29, 2009    BUNIONECTOMY  02/09/2015    COLONOSCOPY, DIAGNOSTIC (SCREENING)  07/2007    left extracorporeal shockwave  11/09/2022    VASECTOMY  05/1992       Family History:     Family History   Problem Relation Name Age of Onset    Stroke Mother Kyrian Stage     Hypertension Mother Adolpho Meenach     Clotting disorder Mother Wendell Nicoson     Heart disease Father John        Social History:     Social History     Socioeconomic History    Marital status: Significant Other   Tobacco Use    Smoking status: Never     Passive exposure: Never    Smokeless tobacco: Never    Tobacco comments:     Passing  faze   Vaping Use    Vaping status: Some Days   Substance and Sexual Activity    Alcohol use: Yes     Alcohol/week: 4.0 - 14.0 standard drinks of alcohol     Types: 4 - 14 Standard drinks or equivalent per week     Comment: 1 glass daily    Drug use: Not Currently  Types: Marijuana, Mescaline, PCP    Sexual activity: Yes     Partners: Female     Birth control/protection: Surgical     Social Drivers of Health     Financial Resource Strain: Low Risk (10/01/2024)    Overall Financial Resource Strain (CARDIA)     Difficulty of Paying Living Expenses: Not hard at all   Food Insecurity: No Food Insecurity (10/01/2024)    Hunger Vital Sign     Worried About Running Out of Food in the Last Year: Never true     Ran Out of Food in the Last Year: Never true   Transportation Needs: No Transportation Needs (10/01/2024)    PRAPARE - Therapist, Art (Medical): No     Lack of  Transportation (Non-Medical): No   Physical Activity: Sufficiently Active (10/01/2024)    Exercise Vital Sign     Days of Exercise per Week: 4 days     Minutes of Exercise per Session: 60 min   Stress: No Stress Concern Present (10/01/2024)    [employer] of Occupational Health - Occupational Stress Questionnaire     Feeling of Stress : Only a little   Social Connections: Socially Isolated (06/30/2023)    Social Connection and Isolation Panel     Frequency of Communication with Friends and Family: More than three times a week     Frequency of Social Gatherings with Friends and Family: Once a week     Attends Religious Services: Never     Database Administrator or Organizations: No     Attends Banker Meetings: Never     Marital Status: Divorced   Catering Manager Violence: Not At Risk (10/01/2024)    Humiliation, Afraid, Rape, and Kick questionnaire     Fear of Current or Ex-Partner: No     Emotionally Abused: No     Physically Abused: No     Sexually Abused: No   Housing Stability: Not At Risk (10/01/2024)    Housing Stability NCSS     Do you have housing?: Yes     Are you worried about losing your housing?: No       Allergies:   No Known Allergies    Medications:     Current Outpatient Medications   Medication Sig Dispense Refill    Cholecalciferol (vitamin D3) 125 MCG (5000 UT) tablet Take 1 tablet (5,000 Units) by mouth daily      evolocumab  (Repatha  SureClick) 140 MG/ML subcutaneous auto-injector INJECT 1 ML INTO THE SKIN EVERY 14 DAYS 6 each 0    fluocinonide  (LIDEX ) 0.05 % cream Apply topically 2 (two) times daily 30 g 1    gabapentin  (NEURONTIN ) 300 MG capsule Take 1 capsule (300 mg) by mouth once at bedtime 30 capsule 1    Multiple Vitamins-Minerals (MULTIVITAMIN WITH MINERALS) tablet Take 1 tablet by mouth daily      sildenafil  (VIAGRA ) 100 MG tablet TAKE 1 TABLET BY MOUTH EVERY DAY AS NEEDED FOR 30 DAYS      valACYclovir  HCL (VALTREX ) 500 MG tablet TAKE 1 TABLET (500 MG) BY MOUTH DAILY AS  NEEDED (ERUPTION) 90 tablet 0    valsartan -hydroCHLOROthiazide  (DIOVAN -HCT) 320-25 MG per tablet Take 1 tablet by mouth once daily 90 tablet 1    Xarelto  10 MG Tab Take 1 tablet (10 mg) by mouth daily       No current facility-administered medications for this visit.       Review of  Systems:   A comprehensive review of systems was:   General ROS: Some hearing issues  Respiratory ROS: No cough shortness of breath or wheezing cardiovascular ROS: no chest pain or dyspnea on exertion  Gastrointestinal ROS: occasional intermittent diarrhea  Genito-Urinary ROS: no dysuria, trouble voiding, or hematuria. Recurrent herpes simplex attacks  Musculoskeletal ROS: Bilateral lower buttock pain intermittently  neurological ROS: Intermittent numbness of the right toes while driving mostly but at nighttime also  Dermatologic ROS: Chronic bilateral hand intermittent hyperhidrosis    Physical Exam:     Vitals:    11/13/24 1205   BP: 124/82   Pulse: 69   Temp: 97.6 F (36.4 C)   SpO2: 97%         BP Readings from Last 3 Encounters:   11/13/24 124/82   10/02/24 131/82   09/23/24 134/90     Wt Readings from Last 3 Encounters:   11/13/24 95.7 kg (211 lb)   10/02/24 94.3 kg (208 lb)   07/03/24 94.3 kg (208 lb)   Body mass index is 29.43 kg/m.    General appearance - alert, well appearing, and in no distress  Chest - clear to auscultation, no wheezes, rales or rhonchi, symmetric air entry  Heart - normal rate, regular rhythm, normal S1, S2, no murmurs, rubs, clicks or gallops  Abdomen - soft, nontender, nondistended, no masses or organomegaly  Neurological - alert, oriented, normal speech, no focal findings or movement disorder noted  Extremities - peripheral pulses normal,  no clubbing or cyanosis   trace edema and variceal dermatitis around the right ankle     ECG shows normal sinus rhythm    Labs:     Recent Results (from the past 12 weeks)   Comprehensive Metabolic Panel    Collection Time: 09/28/24  8:26 AM   Result Value Ref Range     Glucose 115 (H) 70 - 99 mg/dL    BUN 18 8 - 27 mg/dL    Creatinine 8.58 (H) 0.76 - 1.27 mg/dL    eGFR 55 (L) >40 fO/fpw/8.26    BUN / Creatinine Ratio 13 10 - 24    Sodium 141 134 - 144 mmol/L    Potassium 4.1 3.5 - 5.2 mmol/L    Chloride 101 96 - 106 mmol/L    CO2 25 20 - 29 mmol/L    Calcium  9.8 8.6 - 10.2 mg/dL    Protein, Total 6.8 6.0 - 8.5 g/dL    Albumin 4.4 3.9 - 4.9 g/dL    Globulin, Total 2.4 1.5 - 4.5 g/dL    Bilirubin, Total 0.5 0.0 - 1.2 mg/dL    Alkaline Phosphatase 122 47 - 123 IU/L    AST (SGOT) 31 0 - 40 IU/L    ALT 31 0 - 44 IU/L   Lipid Panel    Collection Time: 09/28/24  8:26 AM   Result Value Ref Range    Cholesterol 189 100 - 199 mg/dL    Triglycerides 72 0 - 149 mg/dL    HDL 80 >60 mg/dL    VLDL Calculated 13 5 - 40 mg/dL    LDL Chol Calculated (NIH) 96 0 - 99 mg/dL    Cholesterol / HDL Ratio 2.4 0.0 - 5.0 ratio   Vitamin D , 25 OH, Total    Collection Time: 09/28/24  8:26 AM   Result Value Ref Range    Vitamin D  25-Hydroxy 61.4 30.0 - 100.0 ng/mL   CBC with Differential (Order)  Collection Time: 09/28/24  8:26 AM   Result Value Ref Range    WBC 3.4 3.4 - 10.8 x10E3/uL    RBC 5.65 4.14 - 5.80 x10E6/uL    Hemoglobin 17.3 13.0 - 17.7 g/dL    Hematocrit 47.4 (H) 37.5 - 51.0 %    MCV 93 79 - 97 fL    MCH 30.6 26.6 - 33.0 pg    MCHC 33.0 31.5 - 35.7 g/dL    RDW 86.8 88.3 - 84.5 %    Platelets 133 (L) 150 - 450 x10E3/uL    Neutrophils 29 Not Estab. %    Lymphocytes Automated 48 Not Estab. %    Monocytes 20 Not Estab. %    Eosinophils Automated 3 Not Estab. %    Basophils Automated 0 Not Estab. %    Neutrophils Absolute Count 1.0 (L) 1.4 - 7.0 x10E3/uL    Lymphocytes Absolute 1.7 0.7 - 3.1 x10E3/uL    Monocytes Absolute 0.7 0.1 - 0.9 x10E3/uL    Eosinophils Absolute 0.1 0.0 - 0.4 x10E3/uL    Baso(Absolute) 0.0 0.0 - 0.2 x10E3/uL    Immature Granulocytes 0 Not Estab. %    Immature Granulocytes Absolute 0.0 0.0 - 0.1 x10E3/uL   Renal Function Panel    Collection Time: 09/28/24  8:26 AM   Result  Value Ref Range    Phosphorus 3.0 2.8 - 4.1 mg/dL   PSA, Total And Free    Collection Time: 09/28/24  8:26 AM   Result Value Ref Range    Prostate Specific Antigen, Total 1.3 0.0 - 4.0 ng/mL    PSA, Free 0.32 N/A ng/mL    PSA, Free Pct 24.6 %   Hemoglobin A1C    Collection Time: 09/28/24  8:26 AM   Result Value Ref Range    Hemoglobin A1C 5.7 (H) 4.8 - 5.6 %             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 (CMS/HCC)    Hypertension    H/O lumbar discectomy    Bilateral nephrolithiasis    Mixed hyperlipidemia    Premature ejaculation    Herpes simplex infection of penis    LAFB (left anterior fascicular block)    Throat fullness    COVID-19 virus infection    Adenomatous polyp of colon    Functional diarrhea    Major depressive disorder, recurrent episode, mild    Major depressive disorder, recurrent episode, moderate (CMS/HCC)    Recurrent major depression    Stage 3a chronic kidney disease (CMS/HCC)    Prediabetes    Acute bronchitis due to other specified organisms    Spinal stenosis of lumbar region with neurogenic claudication    Neutropenia, unspecified type    Thrombocytopenia       Orders Placed This Encounter   Procedures    MRI lumbar spine without contrast     Scheduling Instructions:      To schedule your procedure please call a Central Scheduling Number:      [facility] Scheduling [phone]      [facility] Scheduling [phone]     What is the patient's sedation requirement?:   No Sedation     Does the patient have a pacemaker or defibrillator?:   No     Reason for Exam::  lumbar spondylosis     Release to patient:   Immediate         1. History of recurrent right lower extremity DVT he is on Xarelto  and he has intermittent trace edema and chronic varicose dermatitis  I made him a referral for consultation with Dr. Shellia from hematology  I told  him this is lifelong    2. Hyperlipidemia Not well controlled he is supposed to be on Crestor  40 and TriCor  however he does not have Crestor  for quite a while now that he is back on his medications for 3 months with an LDL of 168 we will discontinue both and start him on Repatha  140 mg every 2 weeks if his insurance accepts  And his lipid profile is improved a lot    3.  Recurrent genital herpes about once a year and he gets Valtrex  for 5 days new prescription was given    4.  Hypertension not well-controlled because of poor compliance I admonished his behavior  Valsartan  was increased last time to 320/25 and the levels are much better now    5.  History of premature ejaculation and he uses Zoloft  for that, And Viagra  for erectile dysfunction  He was taking Zoloft  as needed I told him he needs to take it every day    6.  History of colonic polyps last colonoscopy was in 2017 and repeat colonoscopy in 01/2020 if on only 1 polyp nonmalignant told him to come back in 10 years    7.  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  History of kidney stones twice that he can remember drinking plenty of fluids.  Follow-up with urology    9. Erectile dysfunction and he Wanted an increase in the dose of Cialis  therefore we gave him 20 mg    10.  Sensation of fullness in the throat status post steroid treatment by ENT however no improvement he was supposed to get a panendoscopy by Dr. Rankin we did not discuss this today    11.  Diarrhea 4-5 times after antibiotic treatment for tooth extraction Flagyl  that I gave him did not work he saw gastroenterology they did a colonoscopy that only found 1 polyp him back in 10 years  However he tells me he gets watery diarrhea still intermittently I therefore advised him to go back and talk to the gastroenterologist, which he did and they gave him loperamide  and Metamucil strange enough I told him if he is not happy he should find another gastroenterologist    12.  CKD  stage III 8 so far he is quite stable avoid nephrotoxins make sure his blood pressure is well controlled.  I would send him for consultation with Dr. Dwayne from nephrology, he continues to follow-up with them and they continue to tell him everything is stable.  Continue to follow-up with him    13.  Longstanding history of tingling and numbness in his right foot when he is driving less so otherwise I have referred him to neurology for nerve conduction studies but he never went I told him not to worry about it for now I gave him gabapentin  to use on a as needed basis    14.  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 Jiles who is referring him to Dr. Sheppard from vascular surgery.  He does not remember seeing either  one of them we will try and get his report    15.  Lumbar spine stenosis/bilateral lower buttock pain in a person who had a history of lumbar discectomy in 2011 this happens in the mornings he wanted something done I would send him to neurology Dr. Darrick Brew refused MRI of the spine but they did MRI of the buttocks which showed gluteus minimus tendinosis and he was referred to physical therapy  MRI of the lumbar spine was done finally which showed severe spinal stenosis I will refer him to [facility] neurosurgery  In the meantime neurology increased his gabapentin  which is helping  He stopped taking gabapentin  and did not take the duloxetine  that was given to him.  He is seeing a neurosurgeon as well and they finding on EMG  Today he is having lots of pain around the left buttock down the left thigh I gave him gabapentin  diclofenac  and baclofen  and I instructed him to make an appointment with his back surgeon  The neurosurgeon sent him back to pain management because there is nothing surgical for him to do they give him gabapentin  which he does not take because it makes him sleepy I told him to go back to the pain management  and let them deal with this  The neurosurgeon wanted him to see pain management for an epidural they wanted a follow-up CT scan or MRI of his lumbar spine since the last 1 was 1 year ago  I will order the MRI for him    16.  Bilateral nephrolithiasis with intermittent ureteral stones the last 1 was last month November/2022   He did have ESWL recently and he will follow-up with urology next week    17.  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  Vitamin D  deficiency recheck next visit And take it from there in the meantime he can stop taking his supplements    He is up-to-date with all immunizations    RTC in 3 months       This note was generated by the Epic EMR system/Speech recognition and may contain inherent errors or omissions not intended by the user. Grammatical errors, random word insertions, deletions and pronoun errors  are occasional consequences of this technology due to software limitations.   Not all errors are caught or corrected. If there are questions or concerns about the content of this note or information contained within the body of this dictation they should be addressed directly with the author for clarification.      Signed by: Estelita DELENA Alias, MD

## 2024-12-13 ENCOUNTER — Encounter (INDEPENDENT_AMBULATORY_CARE_PROVIDER_SITE_OTHER): Payer: Self-pay

## 2024-12-14 ENCOUNTER — Other Ambulatory Visit (INDEPENDENT_AMBULATORY_CARE_PROVIDER_SITE_OTHER): Payer: Self-pay | Admitting: Internal Medicine

## 2024-12-23 ENCOUNTER — Other Ambulatory Visit: Payer: Self-pay | Admitting: Internal Medicine
